# Patient Record
Sex: Male | Born: 1970 | Race: Black or African American | Hispanic: No | Marital: Married | Smoking: Never smoker
Health system: Southern US, Community
[De-identification: ages and names within clinical notes are randomized; demographics above are authoritative.]

## PROBLEM LIST (undated history)

## (undated) DIAGNOSIS — E119 Type 2 diabetes mellitus without complications: Secondary | ICD-10-CM

## (undated) DIAGNOSIS — I509 Heart failure, unspecified: Secondary | ICD-10-CM

## (undated) DIAGNOSIS — I429 Cardiomyopathy, unspecified: Secondary | ICD-10-CM

## (undated) DIAGNOSIS — I82409 Acute embolism and thrombosis of unspecified deep veins of unspecified lower extremity: Secondary | ICD-10-CM

## (undated) DIAGNOSIS — I10 Essential (primary) hypertension: Secondary | ICD-10-CM

## (undated) DIAGNOSIS — E785 Hyperlipidemia, unspecified: Secondary | ICD-10-CM

## (undated) HISTORY — DX: Essential (primary) hypertension: I10

## (undated) HISTORY — DX: Hyperlipidemia, unspecified: E78.5

## (undated) HISTORY — DX: Cardiomyopathy, unspecified: I42.9

## (undated) HISTORY — DX: Type 2 diabetes mellitus without complications: E11.9

## (undated) HISTORY — DX: Acute embolism and thrombosis of unspecified deep veins of unspecified lower extremity: I82.409

## (undated) HISTORY — DX: Heart failure, unspecified: I50.9

---

## 2009-07-27 ENCOUNTER — Emergency Department: Payer: Self-pay | Admitting: Unknown Physician Specialty

## 2015-03-01 ENCOUNTER — Emergency Department
Admission: EM | Admit: 2015-03-01 | Discharge: 2015-03-01 | Disposition: A | Discharge status: Home / Self Care | Payer: Managed Care, Other (non HMO) | Attending: Emergency Medicine | Admitting: Emergency Medicine

## 2015-03-01 ENCOUNTER — Encounter: Payer: Self-pay | Admitting: Emergency Medicine

## 2015-03-01 DIAGNOSIS — R05 Cough: Secondary | ICD-10-CM | POA: Diagnosis present

## 2015-03-01 DIAGNOSIS — J209 Acute bronchitis, unspecified: Secondary | ICD-10-CM | POA: Insufficient documentation

## 2015-03-01 DIAGNOSIS — J40 Bronchitis, not specified as acute or chronic: Secondary | ICD-10-CM

## 2015-03-01 MED ORDER — AZITHROMYCIN 250 MG PO TABS: ORAL_TABLET | ORAL | Status: AC

## 2015-03-01 MED ORDER — PSEUDOEPH-BROMPHEN-DM 30-2-10 MG/5ML PO SYRP: 5.0000 mL | ORAL_SOLUTION | Freq: Four times a day (QID) | ORAL | Status: DC | PRN

## 2015-03-01 NOTE — ED Notes (Signed)
States he developed flu like sxs' last Thursday.  States fever and some of the body aches are better but conts to have non prod cough

## 2015-03-01 NOTE — ED Notes (Signed)
Pt from home with chills, cough, congestion, headache x 3 days. Pt states he is not coughing up any phlegm, vomited once.

## 2015-03-01 NOTE — ED Provider Notes (Signed)
[facility] Emergency Department Provider Note  ____________________________________________  Time seen: Approximately 12:21 PM  I have reviewed the triage vital signs and the nursing notes.   HISTORY  Chief Complaint Chills; Cough; and Headache    HPI Jerry Saunders is a 44 y.o. male patient complaining of flulike symptoms for 5 days. Patient stated intermittent fever and body aches. Patient stated there is a frontal headache nasal congestion and a nonproductive cough. Patient states one episode of vomiting but denies any nausea or diarrhea. No palliative measures for this complaint. Patient rates his pain discomfort as a 5/10.   History reviewed. No pertinent past medical history.  There are no active problems to display for this patient.   History reviewed. No pertinent past surgical history.  Current Outpatient Rx  Name  Route  Sig  Dispense  Refill  . azithromycin (ZITHROMAX Z-PAK) 250 MG tablet      Take 2 tablets (500 mg) on  Day 1,  followed by 1 tablet (250 mg) once daily on Days 2 through 5.   6 each   0   . brompheniramine-pseudoephedrine-DM 30-2-10 MG/5ML syrup   Oral   Take 5 mLs by mouth 4 (four) times daily as needed.   120 mL   0     Allergies Review of patient's allergies indicates no known allergies.  History reviewed. No pertinent family history.  Social History Social History  Substance Use Topics  . Smoking status: Never Smoker   . Smokeless tobacco: None  . Alcohol Use: No    Review of Systems Constitutional: Fever, chills and body aches. Eyes: No visual changes. ENT: No sore throat. Cardiovascular: Denies chest pain. Respiratory: Denies shortness of breath. Nonproductive cough Gastrointestinal: No abdominal pain.  No nausea, no vomiting.  No diarrhea.  No constipation. Genitourinary: Negative for dysuria. Musculoskeletal: Negative for back pain. Skin: Negative for rash. Neurological: Negative for headaches,  focal weakness or numbness. 10-point ROS otherwise negative.  ____________________________________________   PHYSICAL EXAM:  VITAL SIGNS: ED Triage Vitals  Enc Vitals Group     BP 03/01/15 1135 108/61 mmHg     Pulse Rate 03/01/15 1135 91     Resp --      Temp 03/01/15 1135 98.2 F (36.8 C)     Temp Source 03/01/15 1135 Oral     SpO2 03/01/15 1135 97 %     Weight 03/01/15 1135 268 lb (121.564 kg)     Height 03/01/15 1135 6\' 2"  (1.88 m)     Head Cir --      Peak Flow --      Pain Score 03/01/15 1136 5     Pain Loc --      Pain Edu? --      Excl. in GC? --     Constitutional: Alert and oriented. Well appearing and in no acute distress. Eyes: Conjunctivae are normal. PERRL. EOMI. Head: Atraumatic. Nose: No congestion/rhinnorhea. Mouth/Throat: Mucous membranes are moist.  Oropharynx non-erythematous. Neck: No stridor.  No cervical spine tenderness to palpation. Hematological/Lymphatic/Immunilogical: No cervical lymphadenopathy. Cardiovascular: Normal rate, regular rhythm. Grossly normal heart sounds.  Good peripheral circulation. Respiratory: Normal respiratory effort.  No retractions. Lungs bilateral Rales and increased cough with inspiration. Gastrointestinal: Soft and nontender. No distention. No abdominal bruits. No CVA tenderness. Musculoskeletal: No lower extremity tenderness nor edema.  No joint effusions. Neurologic:  Normal speech and language. No gross focal neurologic deficits are appreciated. No gait instability. Skin:  Skin is warm, dry and  intact. No rash noted. Psychiatric: Mood and affect are normal. Speech and behavior are normal.  ____________________________________________   LABS (all labs ordered are listed, but only abnormal results are displayed)  Labs Reviewed - No data to  display ____________________________________________  EKG   ____________________________________________  RADIOLOGY   ____________________________________________   PROCEDURES  Procedure(s) performed: None  Critical Care performed: No  ____________________________________________   INITIAL IMPRESSION / ASSESSMENT AND PLAN / ED COURSE  Pertinent labs & imaging results that were available during my care of the patient were reviewed by me and considered in my medical decision making (see chart for details).  Bronchitis. Skin a prescription for Bromfed-DM and Zithromax. Patient given a work note for today. Patient advised to [facility] condition [facility]. ____________________________________________   FINAL CLINICAL IMPRESSION(S) / ED DIAGNOSES  Final diagnoses:  Bronchitis      Joni Reining, PA-C 03/01/15 1229  Richardean Canal, MD 03/01/15 [phone]

## 2021-06-18 ENCOUNTER — Emergency Department (HOSPITAL_COMMUNITY): Payer: Medicaid Other

## 2021-06-18 ENCOUNTER — Encounter (HOSPITAL_COMMUNITY): Payer: Self-pay

## 2021-06-18 ENCOUNTER — Other Ambulatory Visit: Payer: Self-pay

## 2021-06-18 ENCOUNTER — Inpatient Hospital Stay (HOSPITAL_COMMUNITY)
Admission: EM | Admit: 2021-06-18 | Discharge: 2021-06-23 | DRG: 286 | Disposition: A | Discharge status: Home / Self Care | Payer: Medicaid Other | Attending: Internal Medicine | Admitting: Internal Medicine

## 2021-06-18 DIAGNOSIS — R0602 Shortness of breath: Secondary | ICD-10-CM | POA: Diagnosis present

## 2021-06-18 DIAGNOSIS — I82562 Chronic embolism and thrombosis of left calf muscular vein: Secondary | ICD-10-CM | POA: Diagnosis present

## 2021-06-18 DIAGNOSIS — R7989 Other specified abnormal findings of blood chemistry: Secondary | ICD-10-CM

## 2021-06-18 DIAGNOSIS — I5082 Biventricular heart failure: Secondary | ICD-10-CM | POA: Diagnosis present

## 2021-06-18 DIAGNOSIS — I248 Other forms of acute ischemic heart disease: Secondary | ICD-10-CM | POA: Diagnosis present

## 2021-06-18 DIAGNOSIS — I272 Pulmonary hypertension, unspecified: Secondary | ICD-10-CM | POA: Diagnosis present

## 2021-06-18 DIAGNOSIS — Z7984 Long term (current) use of oral hypoglycemic drugs: Secondary | ICD-10-CM | POA: Diagnosis not present

## 2021-06-18 DIAGNOSIS — R011 Cardiac murmur, unspecified: Secondary | ICD-10-CM | POA: Diagnosis present

## 2021-06-18 DIAGNOSIS — I420 Dilated cardiomyopathy: Secondary | ICD-10-CM | POA: Diagnosis present

## 2021-06-18 DIAGNOSIS — I5043 Acute on chronic combined systolic (congestive) and diastolic (congestive) heart failure: Principal | ICD-10-CM

## 2021-06-18 DIAGNOSIS — E669 Obesity, unspecified: Secondary | ICD-10-CM | POA: Diagnosis present

## 2021-06-18 DIAGNOSIS — E119 Type 2 diabetes mellitus without complications: Secondary | ICD-10-CM

## 2021-06-18 DIAGNOSIS — K761 Chronic passive congestion of liver: Secondary | ICD-10-CM | POA: Diagnosis present

## 2021-06-18 DIAGNOSIS — R0600 Dyspnea, unspecified: Principal | ICD-10-CM

## 2021-06-18 DIAGNOSIS — J9601 Acute respiratory failure with hypoxia: Secondary | ICD-10-CM | POA: Diagnosis present

## 2021-06-18 DIAGNOSIS — I493 Ventricular premature depolarization: Secondary | ICD-10-CM | POA: Diagnosis present

## 2021-06-18 DIAGNOSIS — Z6837 Body mass index (BMI) 37.0-37.9, adult: Secondary | ICD-10-CM

## 2021-06-18 DIAGNOSIS — I5041 Acute combined systolic (congestive) and diastolic (congestive) heart failure: Secondary | ICD-10-CM

## 2021-06-18 DIAGNOSIS — I82409 Acute embolism and thrombosis of unspecified deep veins of unspecified lower extremity: Secondary | ICD-10-CM

## 2021-06-18 DIAGNOSIS — D649 Anemia, unspecified: Secondary | ICD-10-CM | POA: Diagnosis present

## 2021-06-18 DIAGNOSIS — I509 Heart failure, unspecified: Secondary | ICD-10-CM

## 2021-06-18 LAB — CBC WITH DIFFERENTIAL/PLATELET
Abs Immature Granulocytes: 0.01 10*3/uL (ref 0.00–0.07)
Basophils Absolute: 0 10*3/uL (ref 0.0–0.1)
Basophils Relative: 0 %
Eosinophils Absolute: 0.1 10*3/uL (ref 0.0–0.5)
Eosinophils Relative: 1 %
HCT: 40.5 % (ref 39.0–52.0)
Hemoglobin: 12.6 g/dL — ABNORMAL LOW (ref 13.0–17.0)
Immature Granulocytes: 0 %
Lymphocytes Relative: 33 %
Lymphs Abs: 2.6 10*3/uL (ref 0.7–4.0)
MCH: 27.8 pg (ref 26.0–34.0)
MCHC: 31.1 g/dL (ref 30.0–36.0)
MCV: 89.2 fL (ref 80.0–100.0)
Monocytes Absolute: 0.6 10*3/uL (ref 0.1–1.0)
Monocytes Relative: 8 %
Neutro Abs: 4.5 10*3/uL (ref 1.7–7.7)
Neutrophils Relative %: 58 %
Platelets: 368 10*3/uL (ref 150–400)
RBC: 4.54 MIL/uL (ref 4.22–5.81)
RDW: 14.1 % (ref 11.5–15.5)
WBC: 7.8 10*3/uL (ref 4.0–10.5)
nRBC: 0 % (ref 0.0–0.2)

## 2021-06-18 LAB — COMPREHENSIVE METABOLIC PANEL
ALT: 53 U/L — ABNORMAL HIGH (ref 0–44)
AST: 24 U/L (ref 15–41)
Albumin: 3.5 g/dL (ref 3.5–5.0)
Alkaline Phosphatase: 81 U/L (ref 38–126)
Anion gap: 8 (ref 5–15)
BUN: 16 mg/dL (ref 6–20)
CO2: 27 mmol/L (ref 22–32)
Calcium: 8.7 mg/dL — ABNORMAL LOW (ref 8.9–10.3)
Chloride: 103 mmol/L (ref 98–111)
Creatinine, Ser: 0.95 mg/dL (ref 0.61–1.24)
GFR, Estimated: >60 mL/min (ref >60)
Glucose, Bld: 142 mg/dL — ABNORMAL HIGH (ref 70–99)
Potassium: 4.1 mmol/L (ref 3.5–5.1)
Sodium: 138 mmol/L (ref 135–145)
Total Bilirubin: 1.2 mg/dL (ref 0.3–1.2)
Total Protein: 7 g/dL (ref 6.5–8.1)

## 2021-06-18 LAB — BRAIN NATRIURETIC PEPTIDE: B Natriuretic Peptide: 1295.7 pg/mL — ABNORMAL HIGH (ref 0.0–100.0)

## 2021-06-18 LAB — CBG MONITORING, ED: Glucose-Capillary: 140 mg/dL — ABNORMAL HIGH (ref 70–99)

## 2021-06-18 LAB — TROPONIN I (HIGH SENSITIVITY)
Troponin I (High Sensitivity): 45 ng/L — ABNORMAL HIGH (ref <18)
Troponin I (High Sensitivity): 49 ng/L — ABNORMAL HIGH (ref <18)

## 2021-06-18 LAB — D-DIMER, QUANTITATIVE: D-Dimer, Quant: 2.53 ug/mL-FEU — ABNORMAL HIGH (ref 0.00–0.50)

## 2021-06-18 MED ORDER — FUROSEMIDE 10 MG/ML IJ SOLN
40.0000 mg | Freq: Once | INTRAMUSCULAR | Status: AC
  Administered 2021-06-18: 40 mg via INTRAVENOUS
  Filled 2021-06-18: qty 4

## 2021-06-18 MED ORDER — INSULIN ASPART 100 UNIT/ML IJ SOLN
0.0000 [IU] | Freq: Three times a day (TID) | INTRAMUSCULAR | Status: DC
  Administered 2021-06-19 (×2): 2 [IU] via SUBCUTANEOUS
  Administered 2021-06-19 – 2021-06-20 (×3): 1 [IU] via SUBCUTANEOUS
  Administered 2021-06-21: 2 [IU] via SUBCUTANEOUS
  Administered 2021-06-21: 1 [IU] via SUBCUTANEOUS
  Administered 2021-06-22: 2 [IU] via SUBCUTANEOUS
  Administered 2021-06-22: 5 [IU] via SUBCUTANEOUS
  Administered 2021-06-23: 2 [IU] via SUBCUTANEOUS

## 2021-06-18 MED ORDER — IOHEXOL 350 MG/ML SOLN
100.0000 mL | Freq: Once | INTRAVENOUS | Status: AC | PRN
  Administered 2021-06-18: 100 mL via INTRAVENOUS

## 2021-06-18 MED ORDER — INSULIN ASPART 100 UNIT/ML IJ SOLN: 0.0000 [IU] | Freq: Every day | INTRAMUSCULAR | Status: DC

## 2021-06-18 MED ORDER — ENOXAPARIN SODIUM 40 MG/0.4ML IJ SOSY
40.0000 mg | PREFILLED_SYRINGE | INTRAMUSCULAR | Status: DC
  Administered 2021-06-19 (×2): 40 mg via SUBCUTANEOUS
  Filled 2021-06-18 (×2): qty 0.4

## 2021-06-18 MED ORDER — ALBUTEROL SULFATE HFA 108 (90 BASE) MCG/ACT IN AERS
2.0000 | INHALATION_SPRAY | RESPIRATORY_TRACT | Status: DC | PRN
  Administered 2021-06-18: 2 via RESPIRATORY_TRACT
  Filled 2021-06-18: qty 6.7

## 2021-06-18 MED ORDER — FUROSEMIDE 10 MG/ML IJ SOLN
40.0000 mg | Freq: Two times a day (BID) | INTRAMUSCULAR | Status: DC
  Administered 2021-06-19: 40 mg via INTRAVENOUS
  Filled 2021-06-18: qty 4

## 2021-06-18 NOTE — ED Provider Triage Note (Signed)
Emergency Medicine Provider Triage Evaluation Note  Jerry Saunders , a 51 y.o. male  was evaluated in triage.  Pt complains of shortness of breath has been ongoing for the past week.  He does report a productive cough with some clear sputum.  Feels like is very for him to catch his breath.  He reports is exacerbated when he lays down flat bed at night.  States is harder for him to take a deep breath.  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  He does not have any prior history of blood clots.  He also endorses some left calf pain long with noticing more swelling to his left leg.  Review of Systems  Positive: Shortness of breath, leg swelling Negative: Pain, fever, patient's  Physical Exam  BP (!) 125/97 (BP Location: Right Arm)   Pulse (!) 119   Temp 98.9 F (37.2 C) (Oral)   Resp 19   Ht 6\' 2"  (1.88 m)   Wt 122.5 kg   SpO2 95%   BMI 34.67 kg/m  Gen:   Awake, no distress   Resp:  Normal effort  MSK:   Moves extremities without difficulty  Other:  's are clear to auscultation although somewhat diminished at the bases.  Medical Decision Making  Medically screening exam initiated at 5:38 PM.  Appropriate orders placed.  Jerry Saunders was informed that the remainder of the evaluation will be completed by another provider, this initial triage assessment does not replace that evaluation, and the importance of remaining in the ED until their evaluation is complete.  Patient tachycardic in triage, oxygen saturation 95%.  Speaking in short sentences but without obvious wheezing noted.  No prior history of tobacco use.  No prior history of blood clots, concern for pulmonary embolism.  Dimer has been added.   Jerry Birmingham, PA-C 06/18/21 1743

## 2021-06-18 NOTE — H&P (Signed)
History and Physical    Jerry Saunders [id number] DOB: [birth year] DOA: 06/18/2021  PCP: Pcp, No  Patient coming from: Home  Chief Complaint: Shortness of breath  HPI: Jerry Saunders is a 51 y.o. male with medical history significant of type 2 diabetes not on meds, history of syncope, abnormal EKG, PVCs, and cardiac murmur last evaluated by cardiology at [facility] in 2018.  He presents to the ED complaining of shortness of breath, cough, orthopnea, and bilateral lower extremity edema.  Slightly tachycardic and tachypneic.  SPO2 90% on room air, placed on 2 L supplemental oxygen.  Labs showing WBC 7.8, hemoglobin 12.6, MCV 89.2, platelet count 368k.  Sodium 138, potassium 4.1, chloride 103, bicarb 27, BUN 16, creatinine 0.9, glucose 142.  ALT 53, remainder of LFTs normal.  High-sensitivity troponin 45 >49.  D-dimer 2.53.  BNP 1295.  CTA chest negative for PE.  Showing cardiomegaly, vascular congestion/ possible early edema, and small bilateral effusions.  Scattered borderline sized mediastinal lymph nodes, likely reactive. Patient was given IV Lasix 40 mg and albuterol inhaler treatment.  Patient reports 1.5-week history of dyspnea both with exertion and at rest, cough, orthopnea, and bilateral lower extremity edema.  Denies fevers or chest pain.  He denies history of any medical problems and does not have a primary care physician.  States he has not seen a doctor in several years.  Reports eating fast food at work and drinking a lot of sodas  Review of Systems:  Review of Systems  All other systems reviewed and are negative.   History reviewed. No pertinent past medical history.  History reviewed. No pertinent surgical history.   reports that he has never smoked. He does not have any smokeless tobacco history on file. He reports that he does not drink alcohol and does not use drugs.  No Known Allergies  History reviewed. No pertinent family history.  Prior to Admission medications    Medication Sig Start Date End Date Taking? Authorizing Provider  Artificial Tear Ointment (DRY EYES OP) Place 2 drops into both eyes 2 (two) times daily as needed (dry eyes).   Yes [provider]  ibuprofen (ADVIL) 200 MG tablet Take 600 mg by mouth every 6 (six) hours as needed for headache or moderate pain.   Yes [provider]    Physical Exam: Vitals:   06/18/21 2000 06/18/21 2110 06/18/21 2130 06/18/21 2245  BP: (!) 130/95 (!) 122/93 119/71   Pulse: (!) 113 (!) 120 87 (!) 111  Resp: (!) 36 (!) 33 (!) 30 (!) 27  Temp:      TempSrc:      SpO2: 92% 95% 97% 93%  Weight:      Height:        Physical Exam Vitals reviewed.  Constitutional:      General: He is not in acute distress. HENT:     Head: Normocephalic and atraumatic.  Eyes:     Extraocular Movements: Extraocular movements intact.  Neck:     Comments: +JVD Cardiovascular:     Rate and Rhythm: Regular rhythm. Tachycardia present.     Pulses: Normal pulses.     Comments: Slightly tachycardic Pulmonary:     Effort: No respiratory distress.     Breath sounds: Rales present. No wheezing.     Comments: Slightly tachypneic Abdominal:     General: Bowel sounds are normal.     Palpations: Abdomen is soft.     Tenderness: There is no abdominal tenderness. There is no  guarding.  Musculoskeletal:     Cervical back: Normal range of motion.     Right lower leg: Edema present.     Left lower leg: Edema present.     Comments: 2+ pitting edema of bilateral lower extremities  Skin:    General: Skin is warm and dry.  Neurological:     General: No focal deficit present.     Mental Status: He is alert and oriented to person, place, and time.      Labs on Admission: I have personally reviewed following labs and imaging studies  CBC: Recent Labs  Lab 06/18/21 1735  WBC 7.8  NEUTROABS 4.5  HGB 12.6*  HCT 40.5  MCV 89.2  PLT 123XX123   Basic Metabolic Panel: Recent Labs  Lab 06/18/21 1735  NA 138   K 4.1  CL 103  CO2 27  GLUCOSE 142*  BUN 16  CREATININE 0.95  CALCIUM 8.7*   GFR: Estimated Creatinine Clearance: 130.8 mL/min (by C-G formula based on SCr of 0.95 mg/dL). Liver Function Tests: Recent Labs  Lab 06/18/21 1735  AST 24  ALT 53*  ALKPHOS 81  BILITOT 1.2  PROT 7.0  ALBUMIN 3.5   No results for input(s): "LIPASE", "AMYLASE" in the last 168 hours. No results for input(s): "AMMONIA" in the last 168 hours. Coagulation Profile: No results for input(s): "INR", "PROTIME" in the last 168 hours. Cardiac Enzymes: No results for input(s): "CKTOTAL", "CKMB", "CKMBINDEX", "TROPONINI" in the last 168 hours. BNP (last 3 results) No results for input(s): "PROBNP" in the last 8760 hours. HbA1C: No results for input(s): "HGBA1C" in the last 72 hours. CBG: No results for input(s): "GLUCAP" in the last 168 hours. Lipid Profile: No results for input(s): "CHOL", "HDL", "LDLCALC", "TRIG", "CHOLHDL", "LDLDIRECT" in the last 72 hours. Thyroid Function Tests: No results for input(s): "TSH", "T4TOTAL", "FREET4", "T3FREE", "THYROIDAB" in the last 72 hours. Anemia Panel: No results for input(s): "VITAMINB12", "FOLATE", "FERRITIN", "TIBC", "IRON", "RETICCTPCT" in the last 72 hours. Urine analysis: No results found for: "COLORURINE", "APPEARANCEUR", "LABSPEC", "PHURINE", "GLUCOSEU", "HGBUR", "BILIRUBINUR", "KETONESUR", "PROTEINUR", "UROBILINOGEN", "NITRITE", "LEUKOCYTESUR"  Radiological Exams on Admission: I have personally reviewed images CT Angio Chest PE W and/or Wo Contrast  Result Date: 06/18/2021 CLINICAL DATA:  Shortness of breath, productive cough. Elevated D-dimer. EXAM: CT ANGIOGRAPHY CHEST WITH CONTRAST TECHNIQUE: Multidetector CT imaging of the chest was performed using the standard protocol during bolus administration of intravenous contrast. Multiplanar CT image reconstructions and MIPs were obtained to evaluate the vascular anatomy. RADIATION DOSE REDUCTION: This exam was  performed according to the departmental dose-optimization program which includes automated exposure control, adjustment of the mA and/or kV according to patient size and/or use of iterative reconstruction technique. CONTRAST:  131mL OMNIPAQUE IOHEXOL 350 MG/ML SOLN COMPARISON:  None Available. FINDINGS: Cardiovascular: No filling defects in the pulmonary arteries to suggest pulmonary emboli. Cardiomegaly. Aorta normal caliber. Mediastinum/Nodes: Borderline mediastinal lymph nodes throughout the mediastinum. No axillary or hilar adenopathy. Trachea and esophagus are unremarkable. Thyroid unremarkable. Lungs/Pleura: Small bilateral pleural effusions. Mild ground-glass opacities could reflect mild edema. Upper Abdomen: Scattered low-density lesions in the liver, likely cysts. No acute abnormality. Musculoskeletal: Chest wall soft tissues are unremarkable. No acute bony abnormality. Review of the MIP images confirms the above findings. IMPRESSION: No evidence of pulmonary embolus. Cardiomegaly, vascular congestion.  Possible early edema. Small bilateral effusions. Scattered borderline sized mediastinal lymph nodes, likely reactive. Electronically Signed   By: Rolm Baptise M.D.   On: 06/18/2021 21:01   DG  Chest 2 View  Result Date: 06/18/2021 CLINICAL DATA:  Shortness of breath EXAM: CHEST - 2 VIEW COMPARISON:  None Available. FINDINGS: Gross cardiomegaly. Diffuse bilateral interstitial pulmonary opacity. Osseous structures unremarkable. IMPRESSION: Gross cardiomegaly with diffuse bilateral interstitial pulmonary opacity, likely edema. No focal airspace opacity. Electronically Signed   By: Delanna Ahmadi M.D.   On: 06/18/2021 18:17    EKG: Independently reviewed.  Sinus tachycardia, incomplete RBBB.  No prior tracing for comparison.  Assessment and Plan  Suspected new onset CHF Patient presenting with dyspnea, cough, orthopnea, and bilateral lower extremity edema.  He has not seen a physician in several years.   BNP significantly elevated at 1295.  CT showing cardiomegaly, vascular congestion/possible early edema, small bilateral effusions.  He was last seen by cardiology at [facility] in 2018 for syncope and cardiac murmur but I am not able to see echo results under care everywhere. -Patient was given IV Lasix 40 mg in the ED.  Continue diuresis with IV Lasix 40 mg twice daily starting in the morning. -Echocardiogram -Monitor renal function -Monitor intake and output -Daily weights -Low-sodium diet with fluid restriction  Acute hypoxic respiratory failure Likely due to new onset CHF.  SPO2 90% on room air, currently requiring 2 L supplemental oxygen to maintain sats in the mid 90s. -Continue supplemental oxygen, wean as tolerated  Elevated D-dimer CTA chest negative for PE. -Bilateral lower extremity Dopplers ordered to rule out DVT.  Type 2 diabetes not on medications -Check A1c as it is unknown -Sensitive sliding scale insulin ACHS -Carb modified diet  Mild normocytic anemia Baseline hemoglobin unknown.  Patient is not endorsing any symptoms consistent with active bleed/GI bleed. -Continue to monitor  Mild elevation of transaminase Likely due to hepatic congestion in the setting of new onset heart failure. -Continue to monitor  Mild troponin elevation Likely due to demand ischemia.  Troponin mildly elevated but stable and not consistent with ACS.  Not endorsing chest pain.  DVT prophylaxis: Lovenox Code Status: Full Code Family Communication: No family available at this time. Level of care: Telemetry bed Admission status: It is my clinical opinion that admission to INPATIENT is reasonable and necessary because of the expectation that this patient will require hospital care that crosses at least 2 midnights to treat this condition based on the medical complexity of the problems presented.  Given the aforementioned information, the predictability of an adverse outcome is felt to be significant.    Shela Leff MD [facility]  If 7PM-7AM, please contact night-coverage [email or web address]  06/18/2021, 10:52 PM

## 2021-06-18 NOTE — ED Notes (Signed)
Patient transported to CT 

## 2021-06-18 NOTE — ED Provider Notes (Signed)
[facility] EMERGENCY DEPARTMENT Provider Note   CSN: [id number] Arrival date & time: 06/18/21  1710     History  Chief Complaint  Patient presents with   Shortness of Breath    Jerry Saunders is a 51 y.o. male.  51 year old male with prior medical history as detailed below presents for evaluation.  Patient complains of gradually worsening shortness of breath over the last week.  Patient reports productive cough.  Patient reports increased shortness of breath especially with exertion.  Laying down flat also exacerbates his symptoms.  Patient denies known history of cardiac disease.  Patient does not have established PCP.  Patient has noted increased edema to both lower extremities.  The history is provided by the patient and medical records.  Shortness of Breath Severity:  Moderate Onset quality:  Gradual Duration:  1 week Timing:  Constant Progression:  Waxing and waning Chronicity:  New Context: activity        Home Medications Prior to Admission medications   Medication Sig Start Date End Date Taking? Authorizing Provider  brompheniramine-pseudoephedrine-DM 30-2-10 MG/5ML syrup Take 5 mLs by mouth 4 (four) times daily as needed. 03/01/15   Joni Reining, PA-C      Allergies    Patient has no known allergies.    Review of Systems   Review of Systems  Respiratory:  Positive for shortness of breath.   All other systems reviewed and are negative.   Physical Exam Updated Vital Signs BP (!) 122/93   Pulse (!) 120   Temp 98.9 F (37.2 C) (Oral)   Resp (!) 33   Ht 6\' 2"  (1.88 m)   Wt 122.5 kg   SpO2 95%   BMI 34.67 kg/m  Physical Exam Vitals and nursing note reviewed.  Constitutional:      General: He is not in acute distress.    Appearance: Normal appearance. He is well-developed.  HENT:     Head: Normocephalic and atraumatic.  Eyes:     Conjunctiva/sclera: Conjunctivae normal.     Pupils: Pupils are equal, round, and reactive to  light.  Cardiovascular:     Rate and Rhythm: Normal rate and regular rhythm.     Heart sounds: Normal heart sounds.  Pulmonary:     Effort: Pulmonary effort is normal. No respiratory distress.     Breath sounds: Examination of the right-lower field reveals decreased breath sounds. Examination of the left-lower field reveals decreased breath sounds. Decreased breath sounds present.  Abdominal:     General: There is no distension.     Palpations: Abdomen is soft.     Tenderness: There is no abdominal tenderness.  Musculoskeletal:        General: No deformity. Normal range of motion.     Cervical back: Normal range of motion and neck supple.     Right lower leg: Edema present.     Left lower leg: Edema present.  Skin:    General: Skin is warm and dry.  Neurological:     General: No focal deficit present.     Mental Status: He is alert and oriented to person, place, and time.     ED Results / Procedures / Treatments   Labs (all labs ordered are listed, but only abnormal results are displayed) Labs Reviewed  COMPREHENSIVE METABOLIC PANEL - Abnormal; Notable for the following components:      Result Value   Glucose, Bld 142 (*)    Calcium 8.7 (*)    ALT  53 (*)    All other components within normal limits  CBC WITH DIFFERENTIAL/PLATELET - Abnormal; Notable for the following components:   Hemoglobin 12.6 (*)    All other components within normal limits  BRAIN NATRIURETIC PEPTIDE - Abnormal; Notable for the following components:   B Natriuretic Peptide 1,295.7 (*)    All other components within normal limits  D-DIMER, QUANTITATIVE - Abnormal; Notable for the following components:   D-Dimer, Quant 2.53 (*)    All other components within normal limits  TROPONIN I (HIGH SENSITIVITY) - Abnormal; Notable for the following components:   Troponin I (High Sensitivity) 45 (*)    All other components within normal limits  TROPONIN I (HIGH SENSITIVITY) - Abnormal; Notable for the following  components:   Troponin I (High Sensitivity) 49 (*)    All other components within normal limits    EKG EKG Interpretation  Date/Time:  Sunday June 18 2021 17:29:24 EDT Ventricular Rate:  118 PR Interval:  162 QRS Duration: 102 QT Interval:  336 QTC Calculation: 470 R Axis:   139 Text Interpretation: Sinus tachycardia Biatrial enlargement Right axis deviation Pulmonary disease pattern Incomplete right bundle branch block Right ventricular hypertrophy Abnormal ECG No previous ECGs available Confirmed by Kristine Royal [id number]) on 06/18/2021 8:05:40 PM  Radiology CT Angio Chest PE W and/or Wo Contrast  Result Date: 06/18/2021 CLINICAL DATA:  Shortness of breath, productive cough. Elevated D-dimer. EXAM: CT ANGIOGRAPHY CHEST WITH CONTRAST TECHNIQUE: Multidetector CT imaging of the chest was performed using the standard protocol during bolus administration of intravenous contrast. Multiplanar CT image reconstructions and MIPs were obtained to evaluate the vascular anatomy. RADIATION DOSE REDUCTION: This exam was performed according to the departmental dose-optimization program which includes automated exposure control, adjustment of the mA and/or kV according to patient size and/or use of iterative reconstruction technique. CONTRAST:  OMNIPAQUE IOHEXOL 350 MG/ML SOLN COMPARISON:  None Available. FINDINGS: Cardiovascular: No filling defects in the pulmonary arteries to suggest pulmonary emboli. Cardiomegaly. Aorta normal caliber. Mediastinum/Nodes: Borderline mediastinal lymph nodes throughout the mediastinum. No axillary or hilar adenopathy. Trachea and esophagus are unremarkable. Thyroid unremarkable. Lungs/Pleura: Small bilateral pleural effusions. Mild ground-glass opacities could reflect mild edema. Upper Abdomen: Scattered low-density lesions in the liver, likely cysts. No acute abnormality. Musculoskeletal: Chest wall soft tissues are unremarkable. No acute bony abnormality. Review of the  MIP images confirms the above findings. IMPRESSION: No evidence of pulmonary embolus. Cardiomegaly, vascular congestion.  Possible early edema. Small bilateral effusions. Scattered borderline sized mediastinal lymph nodes, likely reactive. Electronically Signed   By: Charlett Nose M.D.   On: 06/18/2021 21:01   DG Chest 2 View  Result Date: 06/18/2021 CLINICAL DATA:  Shortness of breath EXAM: CHEST - 2 VIEW COMPARISON:  None Available. FINDINGS: Gross cardiomegaly. Diffuse bilateral interstitial pulmonary opacity. Osseous structures unremarkable. IMPRESSION: Gross cardiomegaly with diffuse bilateral interstitial pulmonary opacity, likely edema. No focal airspace opacity. Electronically Signed   By: Jearld Lesch M.D.   On: 06/18/2021 18:17    Procedures Procedures    Medications Ordered in ED Medications  albuterol (VENTOLIN HFA) 108 (90 Base) MCG/ACT inhaler 2 puff (2 puffs Inhalation Given 06/18/21 2103)  iohexol (OMNIPAQUE) 350 MG/ML injection 100 mL (100 mLs Intravenous Contrast Given 06/18/21 2054)  furosemide (LASIX) injection 40 mg (40 mg Intravenous Given 06/18/21 2103)    ED Course/ Medical Decision Making/ A&P  Medical Decision Making Amount and/or Complexity of Data Reviewed Labs: ordered. Radiology: ordered.  Risk Prescription drug management.    Medical Screen Complete  This patient presented to the ED with complaint of shortness of breath.  This complaint involves an extensive number of treatment options. The initial differential diagnosis includes, but is not limited to, CHF exacerbation, pneumonia, PE, etc.  This presentation is: Acute, Self-Limited, Previously Undiagnosed, Uncertain Prognosis, Complicated, Systemic Symptoms, and Threat to Life/Bodily Function  Patient without established PCP and with no reported prior medical history presents with complaint of gradually worsening shortness of breath.  Patient's presentation and exam are  most consistent with likely CHF exacerbation.  This appears to be new in onset.  Patient with mild hypoxia on room air with a saturation of 90%. Sats improved with administration of nasal cannula O2 at 2 L.  Lasix administered here in the ED.  Patient will require admission for further work-up and treatment.  Additional history obtained:  External records from outside sources obtained and reviewed including prior ED visits and prior Inpatient records.    Lab Tests:  I ordered and personally interpreted labs.  The pertinent results include: CBC CMP BNP D-dimer troponin   Imaging Studies ordered:  I ordered imaging studies including chest x-ray I independently visualized and interpreted obtained imaging which showed CHF exacerbation I agree with the radiologist interpretation.   Cardiac Monitoring:  The patient was maintained on a cardiac monitor.  I personally viewed and interpreted the cardiac monitor which showed an underlying rhythm of: Sinus tach   Medicines ordered:  I ordered medication including Lasix for CHF exacerbation Reevaluation of the patient after these medicines showed that the patient: improved    Problem List / ED Course:  CHF exacerbation   Reevaluation:  After the interventions noted above, I reevaluated the patient and found that they have: improved  Disposition:  After consideration of the diagnostic results and the patients response to treatment, I feel that the patent would benefit from admission.          Final Clinical Impression(s) / ED Diagnoses Final diagnoses:  Dyspnea, unspecified type    Rx / DC Orders ED Discharge Orders     None         Wynetta Fines, MD 06/18/21 2232

## 2021-06-18 NOTE — ED Triage Notes (Signed)
Patient reports has had a cough for 3 weeks and then he started getting sob last week. Denies chest pain but is tachy in triage.

## 2021-06-19 ENCOUNTER — Inpatient Hospital Stay (HOSPITAL_COMMUNITY): Payer: Medicaid Other

## 2021-06-19 DIAGNOSIS — R609 Edema, unspecified: Secondary | ICD-10-CM

## 2021-06-19 DIAGNOSIS — R0602 Shortness of breath: Secondary | ICD-10-CM

## 2021-06-19 DIAGNOSIS — I5041 Acute combined systolic (congestive) and diastolic (congestive) heart failure: Secondary | ICD-10-CM

## 2021-06-19 DIAGNOSIS — R7989 Other specified abnormal findings of blood chemistry: Secondary | ICD-10-CM

## 2021-06-19 DIAGNOSIS — R0600 Dyspnea, unspecified: Secondary | ICD-10-CM

## 2021-06-19 LAB — CBC
HCT: 39.4 % (ref 39.0–52.0)
Hemoglobin: 12.3 g/dL — ABNORMAL LOW (ref 13.0–17.0)
MCH: 27.7 pg (ref 26.0–34.0)
MCHC: 31.2 g/dL (ref 30.0–36.0)
MCV: 88.7 fL (ref 80.0–100.0)
Platelets: 341 10*3/uL (ref 150–400)
RBC: 4.44 MIL/uL (ref 4.22–5.81)
RDW: 14.2 % (ref 11.5–15.5)
WBC: 9.3 10*3/uL (ref 4.0–10.5)
nRBC: 0 % (ref 0.0–0.2)

## 2021-06-19 LAB — COMPREHENSIVE METABOLIC PANEL
ALT: 48 U/L — ABNORMAL HIGH (ref 0–44)
AST: 21 U/L (ref 15–41)
Albumin: 3.3 g/dL — ABNORMAL LOW (ref 3.5–5.0)
Alkaline Phosphatase: 78 U/L (ref 38–126)
Anion gap: 8 (ref 5–15)
BUN: 14 mg/dL (ref 6–20)
CO2: 28 mmol/L (ref 22–32)
Calcium: 8.4 mg/dL — ABNORMAL LOW (ref 8.9–10.3)
Chloride: 103 mmol/L (ref 98–111)
Creatinine, Ser: 0.93 mg/dL (ref 0.61–1.24)
GFR, Estimated: >60 mL/min (ref >60)
Glucose, Bld: 192 mg/dL — ABNORMAL HIGH (ref 70–99)
Potassium: 4.1 mmol/L (ref 3.5–5.1)
Sodium: 139 mmol/L (ref 135–145)
Total Bilirubin: 1 mg/dL (ref 0.3–1.2)
Total Protein: 6.5 g/dL (ref 6.5–8.1)

## 2021-06-19 LAB — ECHOCARDIOGRAM COMPLETE
AR max vel: 3.49 cm2
AV Area VTI: 2.57 cm2
AV Area mean vel: 3.06 cm2
AV Mean grad: 4 mmHg
AV Peak grad: 5.4 mmHg
Ao pk vel: 1.16 m/s
Area-P 1/2: 7.9 cm2
Height: 74 in
MV M vel: 3.82 m/s
MV Peak grad: 58.4 mmHg
S' Lateral: 6.2 cm
Weight: 4320 oz

## 2021-06-19 LAB — HEMOGLOBIN A1C
Hgb A1c MFr Bld: 7.8 % — ABNORMAL HIGH (ref 4.8–5.6)
Mean Plasma Glucose: 177.16 mg/dL

## 2021-06-19 LAB — GLUCOSE, CAPILLARY: Glucose-Capillary: 137 mg/dL — ABNORMAL HIGH (ref 70–99)

## 2021-06-19 LAB — HIV ANTIBODY (ROUTINE TESTING W REFLEX): HIV Screen 4th Generation wRfx: NONREACTIVE

## 2021-06-19 LAB — CBG MONITORING, ED
Glucose-Capillary: 144 mg/dL — ABNORMAL HIGH (ref 70–99)
Glucose-Capillary: 151 mg/dL — ABNORMAL HIGH (ref 70–99)
Glucose-Capillary: 152 mg/dL — ABNORMAL HIGH (ref 70–99)

## 2021-06-19 MED ORDER — PERFLUTREN LIPID MICROSPHERE
1.0000 mL | INTRAVENOUS | Status: AC | PRN
  Administered 2021-06-19: 3 mL via INTRAVENOUS

## 2021-06-19 MED ORDER — METOPROLOL TARTRATE 5 MG/5ML IV SOLN
5.0000 mg | Freq: Once | INTRAVENOUS | Status: DC
  Filled 2021-06-19: qty 5

## 2021-06-19 MED ORDER — ACETAMINOPHEN 325 MG PO TABS
650.0000 mg | ORAL_TABLET | Freq: Four times a day (QID) | ORAL | Status: DC | PRN
  Administered 2021-06-19: 650 mg via ORAL
  Filled 2021-06-19: qty 2

## 2021-06-19 MED ORDER — FUROSEMIDE 10 MG/ML IJ SOLN
20.0000 mg | Freq: Two times a day (BID) | INTRAMUSCULAR | Status: DC
Start: 2021-06-19 — End: 2021-06-20
  Administered 2021-06-19 – 2021-06-20 (×3): 20 mg via INTRAVENOUS
  Filled 2021-06-19 (×3): qty 2

## 2021-06-19 NOTE — Progress Notes (Signed)
Lower extremity venous bilateral study completed.  Preliminary results relayed to Vann, DO.  See CV Proc for preliminary results report.   Damontay Alred, RDMS, RVT  

## 2021-06-19 NOTE — Progress Notes (Signed)
PROGRESS NOTE    Jerry Saunders  ANV:[id number] DOB: 1970-10-27 DOA: 06/18/2021 PCP: Pcp, No    Brief Narrative:  Jerry Saunders is a 51 y.o. male with medical history significant of type 2 diabetes not on meds, history of syncope, abnormal EKG, PVCs, and cardiac murmur last evaluated by cardiology at [facility] in 2018.  He presents to the ED complaining of shortness of breath, cough, orthopnea, and bilateral lower extremity edema.     Assessment and Plan: Suspected new onset CHF Patient presenting with dyspnea, cough, orthopnea, and bilateral lower extremity edema.  He has not seen a physician in several years.  BNP significantly elevated at 1295.  CT showing cardiomegaly, vascular congestion/possible early edema, small bilateral effusions.  He was last seen by cardiology at [facility] in 2018 for syncope and cardiac murmur but I am not able to see echo results under care everywhere. - Continue diuresis with IV Lasix 40 mg twice daily starting in the morning. -Echocardiogram pending -Monitor renal function -Monitor intake and output -Daily weights -Low-sodium diet with fluid restriction   Acute hypoxic respiratory failure Likely due to new onset CHF.  SPO2 90% on room air, currently requiring 2 L supplemental oxygen to maintain sats in the mid 90s. -Continue supplemental oxygen, wean as tolerated   Elevated D-dimer CTA chest negative for PE. -Bilateral lower extremity Dopplers ordered to rule out DVT.   Type 2 diabetes not on medications -A1c pending -Sensitive sliding scale insulin ACHS   Mild normocytic anemia Baseline hemoglobin unknown.  Patient is not endorsing any symptoms consistent with active bleed/GI bleed. -Continue to monitor   Mild elevation of transaminase Likely due to hepatic congestion in the setting of new onset heart failure. -Continue to monitor   Mild troponin elevation Likely due to demand ischemia.  Troponin mildly elevated but stable and not consistent with ACS.    Obesity Estimated body mass index is 34.67 kg/m as calculated from the following:   Height as of this encounter: 6\' 2"  (1.88 m).   Weight as of this encounter: 122.5 kg.    DVT prophylaxis: enoxaparin (LOVENOX) injection 40 mg Start: 06/18/21 2330    Code Status: Full Code Family Communication:   Disposition Plan:  Level of care: Telemetry Cardiac Status is: Inpatient Remains inpatient appropriate because: needs echo and IV diuresis    Consultants:  none   Subjective: Swelling in legs better  Objective: Vitals:   06/19/21 0353 06/19/21 0445 06/19/21 0530 06/19/21 0845  BP: 100/73 91/65 (!) 114/91 115/86  Pulse: (!) 112 (!) 103 100 100  Resp: (!) 22 (!) 30 (!) 29 20  Temp:      TempSrc:      SpO2: 100% 100% 94% 95%  Weight:      Height:        Intake/Output Summary (Last 24 hours) at 06/19/2021 1109 Last data filed at 06/19/2021 0955 Gross per 24 hour  Intake --  Output 2600 ml  Net -2600 ml   Filed Weights   06/18/21 1728  Weight: 122.5 kg    Examination:   General: Appearance:    Obese male in no acute distress     Lungs:     Diminished, respirations unlabored  Heart:    Tachycardic.    MS:   All extremities are intact. + LE Edema   Neurologic:   Awake, alert       Data Reviewed: I have personally reviewed following labs and imaging studies  CBC: Recent Labs  Lab  06/18/21 1735 06/19/21 0426  WBC 7.8 9.3  NEUTROABS 4.5  --   HGB 12.6* 12.3*  HCT 40.5 39.4  MCV 89.2 88.7  PLT 368 [id number]   Basic Metabolic Panel: Recent Labs  Lab 06/18/21 1735 06/19/21 0426  NA 138 139  K 4.1 4.1  CL 103 103  CO2 27 28  GLUCOSE 142* 192*  BUN 16 14  CREATININE 0.95 0.93  CALCIUM 8.7* 8.4*   GFR: Estimated Creatinine Clearance: 133.6 mL/min (by C-G formula based on SCr of 0.93 mg/dL). Liver Function Tests: Recent Labs  Lab 06/18/21 1735 06/19/21 0426  AST 24 21  ALT 53* 48*  ALKPHOS 81 78  BILITOT 1.2 1.0  PROT 7.0 6.5  ALBUMIN 3.5 3.3*    No results for input(s): "LIPASE", "AMYLASE" in the last 168 hours. No results for input(s): "AMMONIA" in the last 168 hours. Coagulation Profile: No results for input(s): "INR", "PROTIME" in the last 168 hours. Cardiac Enzymes: No results for input(s): "CKTOTAL", "CKMB", "CKMBINDEX", "TROPONINI" in the last 168 hours. BNP (last 3 results) No results for input(s): "PROBNP" in the last 8760 hours. HbA1C: Recent Labs    06/19/21 0045  HGBA1C 7.8*   CBG: Recent Labs  Lab 06/18/21 2352 06/19/21 0828  GLUCAP 140* 144*   Lipid Profile: No results for input(s): "CHOL", "HDL", "LDLCALC", "TRIG", "CHOLHDL", "LDLDIRECT" in the last 72 hours. Thyroid Function Tests: No results for input(s): "TSH", "T4TOTAL", "FREET4", "T3FREE", "THYROIDAB" in the last 72 hours. Anemia Panel: No results for input(s): "VITAMINB12", "FOLATE", "FERRITIN", "TIBC", "IRON", "RETICCTPCT" in the last 72 hours. Sepsis Labs: No results for input(s): "PROCALCITON", "LATICACIDVEN" in the last 168 hours.  No results found for this or any previous visit (from the past 240 hour(s)).       Radiology Studies: CT Angio Chest PE W and/or Wo Contrast  Result Date: 06/18/2021 CLINICAL DATA:  Shortness of breath, productive cough. Elevated D-dimer. EXAM: CT ANGIOGRAPHY CHEST WITH CONTRAST TECHNIQUE: Multidetector CT imaging of the chest was performed using the standard protocol during bolus administration of intravenous contrast. Multiplanar CT image reconstructions and MIPs were obtained to evaluate the vascular anatomy. RADIATION DOSE REDUCTION: This exam was performed according to the departmental dose-optimization program which includes automated exposure control, adjustment of the mA and/or kV according to patient size and/or use of iterative reconstruction technique. CONTRAST:  179mL OMNIPAQUE IOHEXOL 350 MG/ML SOLN COMPARISON:  None Available. FINDINGS: Cardiovascular: No filling defects in the pulmonary arteries to  suggest pulmonary emboli. Cardiomegaly. Aorta normal caliber. Mediastinum/Nodes: Borderline mediastinal lymph nodes throughout the mediastinum. No axillary or hilar adenopathy. Trachea and esophagus are unremarkable. Thyroid unremarkable. Lungs/Pleura: Small bilateral pleural effusions. Mild ground-glass opacities could reflect mild edema. Upper Abdomen: Scattered low-density lesions in the liver, likely cysts. No acute abnormality. Musculoskeletal: Chest wall soft tissues are unremarkable. No acute bony abnormality. Review of the MIP images confirms the above findings. IMPRESSION: No evidence of pulmonary embolus. Cardiomegaly, vascular congestion.  Possible early edema. Small bilateral effusions. Scattered borderline sized mediastinal lymph nodes, likely reactive. Electronically Signed   By: Rolm Baptise M.D.   On: 06/18/2021 21:01   DG Chest 2 View  Result Date: 06/18/2021 CLINICAL DATA:  Shortness of breath EXAM: CHEST - 2 VIEW COMPARISON:  None Available. FINDINGS: Gross cardiomegaly. Diffuse bilateral interstitial pulmonary opacity. Osseous structures unremarkable. IMPRESSION: Gross cardiomegaly with diffuse bilateral interstitial pulmonary opacity, likely edema. No focal airspace opacity. Electronically Signed   By: Delanna Ahmadi M.D.   On: 06/18/2021 18:17  Scheduled Meds:  enoxaparin (LOVENOX) injection  40 mg Subcutaneous Q24H   furosemide  40 mg Intravenous BID   insulin aspart  0-5 Units Subcutaneous QHS   insulin aspart  0-9 Units Subcutaneous TID WC   Continuous Infusions:   LOS: 1 day    Time spent: 55 minutes spent on chart review, discussion with nursing staff, consultants, updating family and interview/physical exam; more than 50% of that time was spent in counseling and/or coordination of care.    Geradine Girt, DO [facility] Available via Epic secure chat 7am-7pm After these hours, please refer to coverage provider listed on [email or web address] 06/19/2021, 11:09  AM

## 2021-06-19 NOTE — ED Notes (Signed)
Pt using the urinal at bedside. Patient spilled one half of urinal on the floor.

## 2021-06-19 NOTE — Progress Notes (Signed)
Echocardiogram 2D Echocardiogram has been performed.  Joette Catching 06/19/2021, 2:47 PM

## 2021-06-19 NOTE — ED Notes (Signed)
ED TO INPATIENT HANDOFF REPORT  ED Nurse Name and Phone #: [phone] [place] Name/Age/Gender Jerry Saunders 51 y.o. male Room/Bed: 008C/008C  Code Status   Code Status: Full Code  Home/SNF/Other Home Patient oriented to: self, place, time, and situation Is this baseline? Yes   Triage Complete: Triage complete  Chief Complaint CHF (congestive heart failure) (Jerry Saunders) [I50.9]  Triage Note Patient reports has had a cough for 3 weeks and then he started getting sob last week. Denies chest pain but is tachy in triage.    Allergies No Known Allergies  Level of Care/Admitting Diagnosis ED Disposition     ED Disposition  Admit   Condition  --   Comment  Hospital Area: [place] [[id number]]  Level of Care: Telemetry Cardiac [103]  May admit patient to [facility] or [facility] if equivalent level of care is available:: Yes  Covid Evaluation: Asymptomatic - no recent exposure (last 10 days) testing not required  Diagnosis: CH[facility] (congestive heart failure) [id number]  Admitting Physician: Shela Leff [id number]  Attending Physician: Shela Leff [id number]  Estimated length of stay: past midnight tomorrow  Certification:: I certify this patient will need inpatient services for at least 2 midnights          B Medical/Surgery History History reviewed. No pertinent past medical history. History reviewed. No pertinent surgical history.   A IV Location/Drains/Wounds Patient Lines/Drains/Airways Status     Active Line/Drains/Airways     Name Placement date Placement time Site Days   Peripheral IV 06/18/21 20 G Right Antecubital 06/18/21  2016  Antecubital  1            Intake/Output Last 24 hours  Intake/Output Summary (Last 24 hours) at 06/19/2021 1618 Last data filed at 06/19/2021 1119 Gross per 24 hour  Intake --  Output 4300 ml  Net -4300 ml    Labs/Imaging Results for orders placed or performed during the hospital  encounter of 06/18/21 (from the past 48 hour(s))  Comprehensive metabolic panel     Status: Abnormal   Collection Time: 06/18/21  5:35 PM  Result Value Ref Range   Sodium 138 135 - 145 mmol/L   Potassium 4.1 3.5 - 5.1 mmol/L   Chloride 103 98 - 111 mmol/L   CO2 27 22 - 32 mmol/L   Glucose, Bld 142 (H) 70 - 99 mg/dL    Comment: Glucose reference range applies only to samples taken after fasting for at least 8 hours.   BUN 16 6 - 20 mg/dL   Creatinine, Ser 0.95 0.61 - 1.24 mg/dL   Calcium 8.7 (L) 8.9 - 10.3 mg/dL   Total Protein 7.0 6.5 - 8.1 g/dL   Albumin 3.5 3.5 - 5.0 g/dL   AST 24 15 - 41 U/L   ALT 53 (H) 0 - 44 U/L   Alkaline Phosphatase 81 38 - 126 U/L   Total Bilirubin 1.2 0.3 - 1.2 mg/dL   GFR, Estimated >60 >60 mL/min    Comment: (NOTE) Calculated using the CKD-EPI Creatinine Equation (2021)    Anion gap 8 5 - 15    Comment: Performed at [facility], [place] [address], [place], [place] [address]  CBC with Differential     Status: Abnormal   Collection Time: 06/18/21  5:35 PM  Result Value Ref Range   WBC 7.8 4.0 - 10.5 K/uL   RBC 4.54 4.22 - 5.81 MIL/uL   Hemoglobin 12.6 (L) 13.0 - 17.0 g/dL  HCT 40.5 39.0 - 52.0 %   MCV 89.2 80.0 - 100.0 fL   MCH 27.8 26.0 - 34.0 pg   MCHC 31.1 30.0 - 36.0 g/dL   RDW 14.1 11.5 - 15.5 %   Platelets 368 150 - 400 K/uL   nRBC 0.0 0.0 - 0.2 %   Neutrophils Relative % 58 %   Neutro Abs 4.5 1.7 - 7.7 K/uL   Lymphocytes Relative 33 %   Lymphs Abs 2.6 0.7 - 4.0 K/uL   Monocytes Relative 8 %   Monocytes Absolute 0.6 0.1 - 1.0 K/uL   Eosinophils Relative 1 %   Eosinophils Absolute 0.1 0.0 - 0.5 K/uL   Basophils Relative 0 %   Basophils Absolute 0.0 0.0 - 0.1 K/uL   Immature Granulocytes 0 %   Abs Immature Granulocytes 0.01 0.00 - 0.07 K/uL    Comment: Performed at [facility] [address] [place], [place] [address]  Troponin I (High Sensitivity)     Status: Abnormal   Collection Time: 06/18/21  5:35 PM  Result Value  Ref Range   Troponin I (High Sensitivity) 45 (H) <18 ng/L    Comment: (NOTE) Elevated high sensitivity troponin I (hsTnI) values and significant  changes across serial measurements may suggest ACS but many other  chronic and acute conditions are known to elevate hsTnI results.  Refer to the "Links" section for chest pain algorithms and additional  guidance. Performed at [facility], [place] [address] [place], [place] [address]   D-dimer, quantitative     Status: Abnormal   Collection Time: 06/18/21  5:35 PM  Result Value Ref Range   D-Dimer, Quant 2.53 (H) 0.00 - 0.50 ug/mL-FEU    Comment: (NOTE) At the manufacturer cut-off value of 0.5 g/mL FEU, this assay has a negative predictive value of 95-100%.This assay is intended for use in conjunction with a clinical pretest probability (PTP) assessment model to exclude pulmonary embolism (PE) and deep venous thrombosis (DVT) in outpatients suspected of PE or DVT. Results should be correlated with clinical presentation. Performed at [facility], [place] [address], [place], [place] [address]   Brain natriuretic peptide     Status: Abnormal   Collection Time: 06/18/21  5:36 PM  Result Value Ref Range   B Natriuretic Peptide 1,295.7 (H) 0.0 - 100.0 pg/mL    Comment: Performed at [place] [address] [place], [place] [address]  Troponin I (High Sensitivity)     Status: Abnormal   Collection Time: 06/18/21  8:16 PM  Result Value Ref Range   Troponin I (High Sensitivity) 49 (H) <18 ng/L    Comment: (NOTE) Elevated high sensitivity troponin I (hsTnI) values and significant  changes across serial measurements may suggest ACS but many other  chronic and acute conditions are known to elevate hsTnI results.  Refer to the "Links" section for chest pain algorithms and additional  guidance. Performed at [facility], [place] [address] [place], [place] [address]   CBG monitoring, ED     Status: Abnormal   Collection  Time: 06/18/21 11:52 PM  Result Value Ref Range   Glucose-Capillary 140 (H) 70 - 99 mg/dL    Comment: Glucose reference range applies only to samples taken after fasting for at least 8 hours.  HIV Antibody (routine testing w rflx)     Status: None   Collection Time: 06/19/21 12:45 AM  Result Value Ref Range   HIV Screen 4th  Generation wRfx Non Reactive Non Reactive    Comment: Performed at [facility], [address], [place], [place] [address]  Hemoglobin A1c     Status: Abnormal   Collection Time: 06/19/21 12:45 AM  Result Value Ref Range   Hgb A1c MFr Bld 7.8 (H) 4.8 - 5.6 %    Comment: (NOTE) Pre diabetes:          5.7%-6.4%  Diabetes:              >6.4%  Glycemic control for   <7.0% adults with diabetes    Mean Plasma Glucose 177.16 mg/dL    Comment: Performed at [facility], [address] [place], [place] [address]  Comprehensive metabolic panel     Status: Abnormal   Collection Time: 06/19/21  4:26 AM  Result Value Ref Range   Sodium 139 135 - 145 mmol/L   Potassium 4.1 3.5 - 5.1 mmol/L   Chloride 103 98 - 111 mmol/L   CO2 28 22 - 32 mmol/L   Glucose, Bld 192 (H) 70 - 99 mg/dL    Comment: Glucose reference range applies only to samples taken after fasting for at least 8 hours.   BUN 14 6 - 20 mg/dL   Creatinine, Ser 2.50 0.61 - 1.24 mg/dL   Calcium 8.4 (L) 8.9 - 10.3 mg/dL   Total Protein 6.5 6.5 - 8.1 g/dL   Albumin 3.3 (L) 3.5 - 5.0 g/dL   AST 21 15 - 41 U/L   ALT 48 (H) 0 - 44 U/L   Alkaline Phosphatase 78 38 - 126 U/L   Total Bilirubin 1.0 0.3 - 1.2 mg/dL   GFR, Estimated >53 >97 mL/min    Comment: (NOTE) Calculated using the CKD-EPI Creatinine Equation (2021)    Anion gap 8 5 - 15    Comment: Performed at [facility], [address], [place], [place] [address]  CBC     Status: Abnormal   Collection Time: 06/19/21  4:26 AM  Result Value Ref Range   WBC 9.3 4.0 - 10.5 K/uL   RBC 4.44 4.22 - 5.81 MIL/uL   Hemoglobin 12.3 (L) 13.0 -  17.0 g/dL   HCT 93.7 90.2 - 40.9 %   MCV 88.7 80.0 - 100.0 fL   MCH 27.7 26.0 - 34.0 pg   MCHC 31.2 30.0 - 36.0 g/dL   RDW 73.5 32.9 - 92.4 %   Platelets 341 150 - 400 K/uL   nRBC 0.0 0.0 - 0.2 %    Comment: Performed at [facility], [address], [place], [place] [address]  CBG monitoring, ED     Status: Abnormal   Collection Time: 06/19/21  8:28 AM  Result Value Ref Range   Glucose-Capillary 144 (H) 70 - 99 mg/dL    Comment: Glucose reference range applies only to samples taken after fasting for at least 8 hours.  CBG monitoring, ED     Status: Abnormal   Collection Time: 06/19/21 11:47 AM  Result Value Ref Range   Glucose-Capillary 151 (H) 70 - 99 mg/dL    Comment: Glucose reference range applies only to samples taken after fasting for at least 8 hours.   ECHOCARDIOGRAM COMPLETE  Result Date: 06/19/2021    ECHOCARDIOGRAM REPORT   Patient Name:   ISILELI GATSON Date of Exam: 06/19/2021 Medical Rec #:  [id number]     Height:       74.0 in Accession #:  [id number]    Weight:       270.0 lb Date of Birth:  Jun 07, 1970     BSA:          2.469 m Patient Age:    13 years      BP:           104/72 mmHg Patient Gender: M             HR:           107 bpm. Exam Location:  Inpatient Procedure: 2D Echo, Cardiac Doppler, Color Doppler and Intracardiac            Opacification Agent Indications:    Congestive heart failure  History:        Patient has no prior history of Echocardiogram examinations.                 Abnormal ECG, Signs/Symptoms:Shortness of Breath and Cough; Risk                 Factors:Diabetes.  Sonographer:    Joette Catching RCS Referring Phys: [id number] [facility] RATHORE  Sonographer Comments: Image acquisition challenging due to patient body habitus. IMPRESSIONS  1. Left ventricular ejection fraction, by estimation, is <20%. The left ventricle has severely decreased function. The left ventricle demonstrates global hypokinesis. The left ventricular internal cavity size was  moderately to severely dilated. There is  mild left ventricular hypertrophy. Left ventricular diastolic parameters were normal.  2. Right ventricular systolic function is mildly reduced. The right ventricular size is not well visualized. There is normal pulmonary artery systolic pressure.  3. Left atrial size was mild to moderately dilated.  4. The mitral valve is normal in structure. Mild mitral valve regurgitation.  5. The aortic valve is tricuspid. Aortic valve regurgitation is not visualized.  6. Aortic no significant ascending aneurysm.  7. The inferior vena cava is dilated in size with >50% respiratory variability, suggesting right atrial pressure of 8 mmHg. Comparison(s): No prior Echocardiogram. FINDINGS  Left Ventricle: Left ventricular ejection fraction, by estimation, is <20%. The left ventricle has severely decreased function. The left ventricle demonstrates global hypokinesis. Definity contrast agent was given IV to delineate the left ventricular endocardial borders. The left ventricular internal cavity size was moderately to severely dilated. There is mild left ventricular hypertrophy. Left ventricular diastolic parameters were normal. Right Ventricle: The right ventricular size is not well visualized. Right vetricular wall thickness was not well visualized. Right ventricular systolic function is mildly reduced. There is normal pulmonary artery systolic pressure. The tricuspid regurgitant velocity is 2.40 m/s, and with an assumed right atrial pressure of 8 mmHg, the estimated right ventricular systolic pressure is 0000000 mmHg. Left Atrium: Left atrial size was mild to moderately dilated. Right Atrium: Right atrial size was normal in size. Pericardium: There is no evidence of pericardial effusion. Mitral Valve: The mitral valve is normal in structure. Mild mitral valve regurgitation. Tricuspid Valve: The tricuspid valve is normal in structure. Tricuspid valve regurgitation is trivial. Aortic Valve: The  aortic valve is tricuspid. Aortic valve regurgitation is not visualized. Aortic valve mean gradient measures 4.0 mmHg. Aortic valve peak gradient measures 5.4 mmHg. Aortic valve area, by VTI measures 2.57 cm. Pulmonic Valve: The pulmonic valve was not well visualized. Pulmonic valve regurgitation is not visualized. Aorta: No significant ascending aneurysm. Venous: The inferior vena cava is dilated in size with greater than 50% respiratory variability, suggesting right atrial pressure of 8 mmHg.  LEFT VENTRICLE PLAX 2D LVIDd:  6.70 cm   Diastology LVIDs:         6.20 cm   LV e' medial:    5.35 cm/s LV PW:         1.20 cm   LV E/e' medial:  18.4 LV IVS:        1.10 cm   LV e' lateral:   14.80 cm/s LVOT diam:     2.10 cm   LV E/e' lateral: 6.7 LV SV:         56 LV SV Index:   23 LVOT Area:     3.46 cm  RIGHT VENTRICLE             IVC RV Basal diam:  5.80 cm     IVC diam: 2.80 cm RV Mid diam:    4.10 cm RV S prime:     15.50 cm/s TAPSE (M-mode): 1.5 cm LEFT ATRIUM              Index        RIGHT ATRIUM           Index LA diam:        5.70 cm  2.31 cm/m   RA Area:     26.20 cm LA Vol (A2C):   149.0 ml 60.35 ml/m  RA Volume:   94.70 ml  38.35 ml/m LA Vol (A4C):   82.0 ml  33.21 ml/m LA Biplane Vol: 116.0 ml 46.98 ml/m  AORTIC VALVE                    PULMONIC VALVE AV Area (Vmax):    3.49 cm     PV Vmax:          0.93 m/s AV Area (Vmean):   3.06 cm     PV Peak grad:     3.5 mmHg AV Area (VTI):     2.57 cm     PR End Diast Vel: 11.56 msec AV Vmax:           116.00 cm/s AV Vmean:          97.100 cm/s AV VTI:            0.218 m AV Peak Grad:      5.4 mmHg AV Mean Grad:      4.0 mmHg LVOT Vmax:         117.00 cm/s LVOT Vmean:        85.900 cm/s LVOT VTI:          0.162 m LVOT/AV VTI ratio: 0.74  AORTA Ao Root diam: 3.60 cm Ao Asc diam:  3.30 cm MITRAL VALVE               TRICUSPID VALVE MV Area (PHT): 7.90 cm    TR Peak grad:   23.0 mmHg MV Decel Time: 96 msec     TR Vmax:        240.00 cm/s MR Peak grad:  58.4 mmHg MR Mean grad: 41.0 mmHg    SHUNTS MR Vmax:      382.00 cm/s  Systemic VTI:  0.16 m MR Vmean:     311.0 cm/s   Systemic Diam: 2.10 cm MV E velocity: 98.50 cm/s MV A velocity: 52.50 cm/s MV E/A ratio:  1.88 Mary Scientist, physiological signed by Phineas Inches Signature Date/Time: 06/19/2021/3:02:05 PM    Final    VAS [place] LOWER EXTREMITY VENOUS (DVT)  Result Date: 06/19/2021  Lower Venous DVT Study Patient  Name:  ROBET MARTENSON  Date of Exam:   06/19/2021 Medical Rec #: [id number]      Accession #:    [id number] Date of Birth: 11/06/1970      Patient Gender: M Patient Age:   33 years Exam Location:  [facility] Procedure:      VAS [place] LOWER EXTREMITY VENOUS (DVT) Referring Phys: Wandra Feinstein RATHORE --------------------------------------------------------------------------------  Indications: Edema, shortness of breath, elevated d-dimer.  Comparison Study: No prior studies. Performing Technologist: Darlin Coco RDMS, RVT  Examination Guidelines: A complete evaluation includes B-mode imaging, spectral Doppler, color Doppler, and power Doppler as needed of all accessible portions of each vessel. Bilateral testing is considered an integral part of a complete examination. Limited examinations for reoccurring indications may be performed as noted. The reflux portion of the exam is performed with the patient in reverse Trendelenburg.  +---------+---------------+---------+-----------+----------+--------------+ RIGHT    CompressibilityPhasicitySpontaneityPropertiesThrombus Aging +---------+---------------+---------+-----------+----------+--------------+ CFV      Full           No       Yes                                 +---------+---------------+---------+-----------+----------+--------------+ SFJ      Full                                                        +---------+---------------+---------+-----------+----------+--------------+ FV Prox  Full                                                         +---------+---------------+---------+-----------+----------+--------------+ FV Mid   Full                                                        +---------+---------------+---------+-----------+----------+--------------+ FV DistalFull                                                        +---------+---------------+---------+-----------+----------+--------------+ PFV      Full                                                        +---------+---------------+---------+-----------+----------+--------------+ POP      Full           No       Yes                                 +---------+---------------+---------+-----------+----------+--------------+ PTV      Full                                                        +---------+---------------+---------+-----------+----------+--------------+  PERO     Full                                                        +---------+---------------+---------+-----------+----------+--------------+ Gastroc  Full                                                        +---------+---------------+---------+-----------+----------+--------------+   +---------+---------------+---------+-----------+----------+-----------------+ LEFT     CompressibilityPhasicitySpontaneityPropertiesThrombus Aging    +---------+---------------+---------+-----------+----------+-----------------+ CFV      Full           No       Yes                                    +---------+---------------+---------+-----------+----------+-----------------+ SFJ      Full                                                           +---------+---------------+---------+-----------+----------+-----------------+ FV Prox  Full                                                           +---------+---------------+---------+-----------+----------+-----------------+ FV Mid   Full                                                            +---------+---------------+---------+-----------+----------+-----------------+ FV DistalFull                                                           +---------+---------------+---------+-----------+----------+-----------------+ PFV      Full                                                           +---------+---------------+---------+-----------+----------+-----------------+ POP      Full           No       Yes                                    +---------+---------------+---------+-----------+----------+-----------------+ PTV      Full                                                           +---------+---------------+---------+-----------+----------+-----------------+  PERO     Full                                                           +---------+---------------+---------+-----------+----------+-----------------+ Gastroc  None           No       No                   Age Indeterminate +---------+---------------+---------+-----------+----------+-----------------+     Summary: RIGHT: - There is no evidence of deep vein thrombosis in the lower extremity.  - No cystic structure found in the popliteal fossa.  LEFT: - Findings consistent with age indeterminate deep vein thrombosis involving the left gastrocnemius veins.  - No cystic structure found in the popliteal fossa.  *See table(s) above for measurements and observations.    Preliminary    CT Angio Chest PE W and/or Wo Contrast  Result Date: 06/18/2021 CLINICAL DATA:  Shortness of breath, productive cough. Elevated D-dimer. EXAM: CT ANGIOGRAPHY CHEST WITH CONTRAST TECHNIQUE: Multidetector CT imaging of the chest was performed using the standard protocol during bolus administration of intravenous contrast. Multiplanar CT image reconstructions and MIPs were obtained to evaluate the vascular anatomy. RADIATION DOSE REDUCTION: This exam was performed according to the departmental dose-optimization program which  includes automated exposure control, adjustment of the mA and/or kV according to patient size and/or use of iterative reconstruction technique. CONTRAST:  160mL OMNIPAQUE IOHEXOL 350 MG/ML SOLN COMPARISON:  None Available. FINDINGS: Cardiovascular: No filling defects in the pulmonary arteries to suggest pulmonary emboli. Cardiomegaly. Aorta normal caliber. Mediastinum/Nodes: Borderline mediastinal lymph nodes throughout the mediastinum. No axillary or hilar adenopathy. Trachea and esophagus are unremarkable. Thyroid unremarkable. Lungs/Pleura: Small bilateral pleural effusions. Mild ground-glass opacities could reflect mild edema. Upper Abdomen: Scattered low-density lesions in the liver, likely cysts. No acute abnormality. Musculoskeletal: Chest wall soft tissues are unremarkable. No acute bony abnormality. Review of the MIP images confirms the above findings. IMPRESSION: No evidence of pulmonary embolus. Cardiomegaly, vascular congestion.  Possible early edema. Small bilateral effusions. Scattered borderline sized mediastinal lymph nodes, likely reactive. Electronically Signed   By: Rolm Baptise M.D.   On: 06/18/2021 21:01   DG Chest 2 View  Result Date: 06/18/2021 CLINICAL DATA:  Shortness of breath EXAM: CHEST - 2 VIEW COMPARISON:  None Available. FINDINGS: Gross cardiomegaly. Diffuse bilateral interstitial pulmonary opacity. Osseous structures unremarkable. IMPRESSION: Gross cardiomegaly with diffuse bilateral interstitial pulmonary opacity, likely edema. No focal airspace opacity. Electronically Signed   By: Delanna Ahmadi M.D.   On: 06/18/2021 18:17    Pending Labs Unresulted Labs (From admission, onward)     Start     Ordered   06/20/21 0500  CBC  Tomorrow morning,   R        06/19/21 1112   06/20/21 [id number]  Basic metabolic panel  Tomorrow morning,   R        06/19/21 1112            Vitals/Pain Today's Vitals   06/19/21 0530 06/19/21 0845 06/19/21 1100 06/19/21 1315  BP: (!) 114/91  115/86 (!) 158/114 114/87  Pulse: 100 100 (!) 128 (!) 159  Resp: (!) 29 20 (!) 25 (!) 22  Temp:      TempSrc:      SpO2: 94% 95% 96%  95%  Weight:      Height:      PainSc:        Isolation Precautions No active isolations  Medications Medications  enoxaparin (LOVENOX) injection 40 mg (40 mg Subcutaneous Given 06/19/21 0044)  insulin aspart (novoLOG) injection 0-9 Units (2 Units Subcutaneous Given 06/19/21 1213)  insulin aspart (novoLOG) injection 0-5 Units ( Subcutaneous Not Given 06/18/21 2354)  acetaminophen (TYLENOL) tablet 650 mg (has no administration in time range)  furosemide (LASIX) injection 20 mg (has no administration in time range)  metoprolol tartrate (LOPRESSOR) injection 5 mg (0 mg Intravenous Hold 06/19/21 1329)  perflutren lipid microspheres (DEFINITY) IV suspension (3 mLs Intravenous Given 06/19/21 1447)  iohexol (OMNIPAQUE) 350 MG/ML injection 100 mL (100 mLs Intravenous Contrast Given 06/18/21 2054)  furosemide (LASIX) injection 40 mg (40 mg Intravenous Given 06/18/21 2103)    Mobility walks Low fall risk   Focused Assessments Cardiac Assessment Handoff:    No results found for: "CKTOTAL", "CKMB", "CKMBINDEX", "TROPONINI" Lab Results  Component Value Date   DDIMER 2.53 (H) 06/18/2021   Does the Patient currently have chest pain? No    R Recommendations: See Admitting Provider Note  Report given to:   Additional Notes:

## 2021-06-20 ENCOUNTER — Encounter (HOSPITAL_COMMUNITY)
Admission: EM | Disposition: A | Discharge status: Home / Self Care | Payer: Self-pay | Source: Home / Self Care | Attending: Internal Medicine

## 2021-06-20 ENCOUNTER — Other Ambulatory Visit (HOSPITAL_COMMUNITY): Payer: Self-pay

## 2021-06-20 ENCOUNTER — Encounter (HOSPITAL_COMMUNITY): Payer: Self-pay | Admitting: Cardiology

## 2021-06-20 DIAGNOSIS — I82409 Acute embolism and thrombosis of unspecified deep veins of unspecified lower extremity: Secondary | ICD-10-CM

## 2021-06-20 DIAGNOSIS — I5020 Unspecified systolic (congestive) heart failure: Secondary | ICD-10-CM

## 2021-06-20 DIAGNOSIS — I5041 Acute combined systolic (congestive) and diastolic (congestive) heart failure: Secondary | ICD-10-CM

## 2021-06-20 HISTORY — PX: RIGHT/LEFT HEART CATH AND CORONARY ANGIOGRAPHY: CATH118266

## 2021-06-20 LAB — RAPID URINE DRUG SCREEN, HOSP PERFORMED
Amphetamines: NOT DETECTED
Barbiturates: NOT DETECTED
Benzodiazepines: NOT DETECTED
Cocaine: NOT DETECTED
Opiates: NOT DETECTED
Tetrahydrocannabinol: NOT DETECTED

## 2021-06-20 LAB — RESPIRATORY PANEL BY PCR

## 2021-06-20 LAB — CBC
HCT: 38.7 % — ABNORMAL LOW (ref 39.0–52.0)
Hemoglobin: 12.2 g/dL — ABNORMAL LOW (ref 13.0–17.0)
MCH: 27.5 pg (ref 26.0–34.0)
MCHC: 31.5 g/dL (ref 30.0–36.0)
MCV: 87.4 fL (ref 80.0–100.0)
Platelets: 311 10*3/uL (ref 150–400)
RBC: 4.43 MIL/uL (ref 4.22–5.81)
RDW: 14.2 % (ref 11.5–15.5)
WBC: 8.1 10*3/uL (ref 4.0–10.5)
nRBC: 0 % (ref 0.0–0.2)

## 2021-06-20 LAB — POCT I-STAT EG7
Acid-Base Excess: 8 mmol/L — ABNORMAL HIGH (ref 0.0–2.0)
Bicarbonate: 33.8 mmol/L — ABNORMAL HIGH (ref 20.0–28.0)
Calcium, Ion: 1.1 mmol/L — ABNORMAL LOW (ref 1.15–1.40)
HCT: 40 % (ref 39.0–52.0)
Hemoglobin: 13.6 g/dL (ref 13.0–17.0)
O2 Saturation: 63 %
Potassium: 3.7 mmol/L (ref 3.5–5.1)
Sodium: 143 mmol/L (ref 135–145)
TCO2: 35 mmol/L — ABNORMAL HIGH (ref 22–32)
pCO2, Ven: 50.7 mmHg (ref 44–60)
pH, Ven: 7.432 — ABNORMAL HIGH (ref 7.25–7.43)
pO2, Ven: 32 mmHg (ref 32–45)

## 2021-06-20 LAB — BASIC METABOLIC PANEL
Anion gap: 9 (ref 5–15)
BUN: 14 mg/dL (ref 6–20)
CO2: 29 mmol/L (ref 22–32)
Calcium: 8.2 mg/dL — ABNORMAL LOW (ref 8.9–10.3)
Chloride: 103 mmol/L (ref 98–111)
Creatinine, Ser: 0.78 mg/dL (ref 0.61–1.24)
GFR, Estimated: >60 mL/min (ref >60)
Glucose, Bld: 138 mg/dL — ABNORMAL HIGH (ref 70–99)
Potassium: 3.6 mmol/L (ref 3.5–5.1)
Sodium: 141 mmol/L (ref 135–145)

## 2021-06-20 LAB — POCT I-STAT 7, (LYTES, BLD GAS, ICA,H+H)
Acid-Base Excess: 6 mmol/L — ABNORMAL HIGH (ref 0.0–2.0)
Bicarbonate: 30.9 mmol/L — ABNORMAL HIGH (ref 20.0–28.0)
Calcium, Ion: 1.07 mmol/L — ABNORMAL LOW (ref 1.15–1.40)
HCT: 38 % — ABNORMAL LOW (ref 39.0–52.0)
Hemoglobin: 12.9 g/dL — ABNORMAL LOW (ref 13.0–17.0)
O2 Saturation: 97 %
Potassium: 3.5 mmol/L (ref 3.5–5.1)
Sodium: 143 mmol/L (ref 135–145)
TCO2: 32 mmol/L (ref 22–32)
pCO2 arterial: 43.6 mmHg (ref 32–48)
pH, Arterial: 7.459 — ABNORMAL HIGH (ref 7.35–7.45)
pO2, Arterial: 84 mmHg (ref 83–108)

## 2021-06-20 LAB — MAGNESIUM: Magnesium: 2.1 mg/dL (ref 1.7–2.4)

## 2021-06-20 LAB — GLUCOSE, CAPILLARY
Glucose-Capillary: 147 mg/dL — ABNORMAL HIGH (ref 70–99)
Glucose-Capillary: 167 mg/dL — ABNORMAL HIGH (ref 70–99)

## 2021-06-20 LAB — TSH: TSH: 1.26 u[IU]/mL (ref 0.350–4.500)

## 2021-06-20 LAB — MRSA NEXT GEN BY PCR, NASAL: MRSA by PCR Next Gen: NOT DETECTED

## 2021-06-20 SURGERY — RIGHT/LEFT HEART CATH AND CORONARY ANGIOGRAPHY
Anesthesia: LOCAL

## 2021-06-20 MED ORDER — VERAPAMIL HCL 2.5 MG/ML IV SOLN
INTRAVENOUS | Status: AC
  Filled 2021-06-20: qty 2

## 2021-06-20 MED ORDER — MIDAZOLAM HCL 2 MG/2ML IJ SOLN
INTRAMUSCULAR | Status: AC
  Filled 2021-06-20: qty 2

## 2021-06-20 MED ORDER — ADENOSINE 6 MG/2ML IV SOLN
INTRAVENOUS | Status: AC
Start: 2021-06-20 — End: ?
  Filled 2021-06-20: qty 4

## 2021-06-20 MED ORDER — ONDANSETRON HCL 4 MG/2ML IJ SOLN: 4.0000 mg | Freq: Four times a day (QID) | INTRAMUSCULAR | Status: DC | PRN

## 2021-06-20 MED ORDER — DAPAGLIFLOZIN PROPANEDIOL 10 MG PO TABS
10.0000 mg | ORAL_TABLET | Freq: Every day | ORAL | Status: DC
  Administered 2021-06-20 – 2021-06-23 (×4): 10 mg via ORAL
  Filled 2021-06-20 (×4): qty 1

## 2021-06-20 MED ORDER — FENTANYL CITRATE (PF) 100 MCG/2ML IJ SOLN
INTRAMUSCULAR | Status: DC | PRN
  Administered 2021-06-20: 25 ug via INTRAVENOUS

## 2021-06-20 MED ORDER — APIXABAN 5 MG PO TABS
10.0000 mg | ORAL_TABLET | Freq: Two times a day (BID) | ORAL | Status: DC
Start: 2021-06-20 — End: 2021-06-23
  Administered 2021-06-20 – 2021-06-23 (×6): 10 mg via ORAL
  Filled 2021-06-20 (×6): qty 2

## 2021-06-20 MED ORDER — SODIUM CHLORIDE 0.9% FLUSH
3.0000 mL | INTRAVENOUS | Status: DC | PRN
Start: 2021-06-20 — End: 2021-06-23

## 2021-06-20 MED ORDER — MIDAZOLAM HCL 2 MG/2ML IJ SOLN
INTRAMUSCULAR | Status: DC | PRN
  Administered 2021-06-20: 1 mg via INTRAVENOUS

## 2021-06-20 MED ORDER — SODIUM CHLORIDE 0.9% FLUSH: 3.0000 mL | INTRAVENOUS | Status: DC | PRN

## 2021-06-20 MED ORDER — ADENOSINE 6 MG/2ML IV SOLN
INTRAVENOUS | Status: DC | PRN
  Administered 2021-06-20: 12 mg via INTRAVENOUS
  Administered 2021-06-20: 6 mg via INTRAVENOUS

## 2021-06-20 MED ORDER — POTASSIUM CHLORIDE CRYS ER 20 MEQ PO TBCR
40.0000 meq | EXTENDED_RELEASE_TABLET | Freq: Four times a day (QID) | ORAL | Status: AC
  Administered 2021-06-20 (×2): 40 meq via ORAL
  Filled 2021-06-20 (×2): qty 2

## 2021-06-20 MED ORDER — FENTANYL CITRATE (PF) 100 MCG/2ML IJ SOLN
INTRAMUSCULAR | Status: AC
  Filled 2021-06-20: qty 2

## 2021-06-20 MED ORDER — FUROSEMIDE 10 MG/ML IJ SOLN
40.0000 mg | Freq: Two times a day (BID) | INTRAMUSCULAR | Status: DC
  Administered 2021-06-20: 20 mg via INTRAVENOUS
  Administered 2021-06-21: 40 mg via INTRAVENOUS
  Filled 2021-06-20 (×2): qty 4

## 2021-06-20 MED ORDER — VERAPAMIL HCL 2.5 MG/ML IV SOLN
INTRAVENOUS | Status: DC | PRN
  Administered 2021-06-20: 10 mL via INTRA_ARTERIAL

## 2021-06-20 MED ORDER — LORAZEPAM 2 MG/ML IJ SOLN
1.0000 mg | Freq: Once | INTRAMUSCULAR | Status: AC
  Administered 2021-06-22: 1 mg via INTRAVENOUS
  Filled 2021-06-20: qty 1

## 2021-06-20 MED ORDER — LIDOCAINE HCL (PF) 1 % IJ SOLN
INTRAMUSCULAR | Status: DC | PRN
  Administered 2021-06-20 (×2): 2 mL

## 2021-06-20 MED ORDER — IVABRADINE HCL 5 MG PO TABS
5.0000 mg | ORAL_TABLET | Freq: Two times a day (BID) | ORAL | Status: DC
  Administered 2021-06-20 – 2021-06-23 (×6): 5 mg via ORAL
  Filled 2021-06-20 (×7): qty 1

## 2021-06-20 MED ORDER — SODIUM CHLORIDE 0.9 % IV SOLN: INTRAVENOUS | Status: DC

## 2021-06-20 MED ORDER — HEPARIN (PORCINE) 25000 UT/250ML-% IV SOLN
1550.0000 [IU]/h | INTRAVENOUS | Status: DC
  Administered 2021-06-20: 1550 [IU]/h via INTRAVENOUS
  Filled 2021-06-20: qty 250

## 2021-06-20 MED ORDER — SODIUM CHLORIDE 0.9% FLUSH
3.0000 mL | Freq: Two times a day (BID) | INTRAVENOUS | Status: DC
  Administered 2021-06-20 – 2021-06-23 (×5): 3 mL via INTRAVENOUS

## 2021-06-20 MED ORDER — ACETAMINOPHEN 325 MG PO TABS
650.0000 mg | ORAL_TABLET | ORAL | Status: DC | PRN
  Administered 2021-06-22: 650 mg via ORAL
  Filled 2021-06-20: qty 2

## 2021-06-20 MED ORDER — SODIUM CHLORIDE 0.9 % IV SOLN
250.0000 mL | INTRAVENOUS | Status: DC | PRN
Start: 2021-06-20 — End: 2021-06-20

## 2021-06-20 MED ORDER — APIXABAN 5 MG PO TABS: 5.0000 mg | ORAL_TABLET | Freq: Two times a day (BID) | ORAL | Status: DC

## 2021-06-20 MED ORDER — SODIUM CHLORIDE 0.9 % IV SOLN: 250.0000 mL | INTRAVENOUS | Status: DC | PRN

## 2021-06-20 MED ORDER — HEPARIN (PORCINE) IN NACL 1000-0.9 UT/500ML-% IV SOLN
INTRAVENOUS | Status: AC
  Filled 2021-06-20: qty 1000

## 2021-06-20 MED ORDER — ADENOSINE 6 MG/2ML IV SOLN
INTRAVENOUS | Status: AC
Start: 2021-06-20 — End: ?
  Filled 2021-06-20: qty 2

## 2021-06-20 MED ORDER — SODIUM CHLORIDE 0.9% FLUSH
3.0000 mL | Freq: Two times a day (BID) | INTRAVENOUS | Status: DC
  Administered 2021-06-20 – 2021-06-23 (×6): 3 mL via INTRAVENOUS

## 2021-06-20 MED ORDER — ASPIRIN 81 MG PO CHEW
81.0000 mg | CHEWABLE_TABLET | ORAL | Status: AC
  Administered 2021-06-20: 81 mg via ORAL
  Filled 2021-06-20: qty 1

## 2021-06-20 MED ORDER — HEPARIN BOLUS VIA INFUSION
4000.0000 [IU] | Freq: Once | INTRAVENOUS | Status: AC
  Administered 2021-06-20: 4000 [IU] via INTRAVENOUS
  Filled 2021-06-20: qty 4000

## 2021-06-20 MED ORDER — LABETALOL HCL 5 MG/ML IV SOLN: 10.0000 mg | INTRAVENOUS | Status: AC | PRN

## 2021-06-20 MED ORDER — HEPARIN SODIUM (PORCINE) 1000 UNIT/ML IJ SOLN
INTRAMUSCULAR | Status: DC | PRN
  Administered 2021-06-20: 6000 [IU] via INTRAVENOUS

## 2021-06-20 MED ORDER — ASPIRIN 81 MG PO CHEW: 81.0000 mg | CHEWABLE_TABLET | ORAL | Status: DC

## 2021-06-20 MED ORDER — HYDRALAZINE HCL 20 MG/ML IJ SOLN
10.0000 mg | INTRAMUSCULAR | Status: AC | PRN
Start: 2021-06-20 — End: 2021-06-20

## 2021-06-20 MED ORDER — HEPARIN (PORCINE) IN NACL 1000-0.9 UT/500ML-% IV SOLN
INTRAVENOUS | Status: DC | PRN
  Administered 2021-06-20 (×2): 500 mL

## 2021-06-20 MED ORDER — LIDOCAINE HCL (PF) 1 % IJ SOLN
INTRAMUSCULAR | Status: AC
  Filled 2021-06-20: qty 30

## 2021-06-20 SURGICAL SUPPLY — 13 items
BAND ZEPHYR COMPRESS 30 LONG (HEMOSTASIS) ×1 IMPLANT
CATH BALLN WEDGE 5F 110CM (CATHETERS) ×1 IMPLANT
CATH INFINITI 5 FR STR PIGTAIL (CATHETERS) ×1 IMPLANT
CATH INFINITI 5FR ANG PIGTAIL (CATHETERS) ×1 IMPLANT
CATH OPTITORQUE TIG 4.0 5F (CATHETERS) ×1 IMPLANT
GLIDESHEATH SLEND A-KIT 6F 22G (SHEATH) ×1 IMPLANT
GUIDEWIRE INQWIRE 1.5J.035X260 (WIRE) IMPLANT
INQWIRE 1.5J .035X260CM (WIRE) ×2
KIT HEART LEFT (KITS) ×2 IMPLANT
PACK CARDIAC CATHETERIZATION (CUSTOM PROCEDURE TRAY) ×2 IMPLANT
SHEATH GLIDE SLENDER 4/5FR (SHEATH) ×1 IMPLANT
TRANSDUCER W/STOPCOCK (MISCELLANEOUS) ×2 IMPLANT
TUBING CIL FLEX 10 FLL-RA (TUBING) ×2 IMPLANT

## 2021-06-20 NOTE — TOC Benefit Eligibility Note (Signed)
Patient Advocate Encounter   Unable to find ins covg for this pt at this time.

## 2021-06-20 NOTE — Progress Notes (Signed)
PROGRESS NOTE    Jerry Saunders  [id number] DOB: 23-Nov-1970 DOA: 06/18/2021 PCP: Pcp, No   Brief Narrative: 51 year old with past medical history significant for type 2 diabetes not on medication, history of syncope, abnormal EKG, PVC and cardiac murmur last evaluated by cardiology at [facility] 2018.  He presented complaining of worsening shortness of breath, cough, orthopnea, bilateral lower extremity edema.  He was found to have acute systolic heart failure exacerbation, echo with ejection fraction 20%.  Cardiology consulted planning for cardiac cath.  He was also found to have left age-indeterminate gastrocnemius   vein DVT. He was started on heparin gtt.    Assessment & Plan:   Principal Problem:   CHF (congestive heart failure) (HCC) Active Problems:   Acute respiratory failure with hypoxia (HCC)   Elevated d-dimer   Type 2 diabetes mellitus (HCC)   Normocytic anemia   1-Acute Systolic and Diastolic  Heart Failure Exacerbation:  Echo 6/12: Ejection fraction less than 20%.  Global hypokinesis.  Right ventricular systolic function is mildly reduced. Radiology consulted and planning heart cath today. Cardiology planning a statin Corlanor and Farxiga Continue with IV Lasix For dilated cardiomyopathy might need cardiac MRI  Diabetes type 2: A1c 7.8 SSI   Left gastrocnemius vein DVT indeterminate. Started heparin drip.  Will need to  transition to Eliquis  Acute hypoxic respiratory failure: In the setting of acute systolic and diastolic heart failure exacerbation.  Continue with IV Lasix Wean off oxygen as needed  Transaminases: Likely hepatic congestion in the setting of heart failure exacerbation. Trending down.   Mild normocytic anemia: Monitor hemoglobin  Elevation troponin. : Cardiology following  Obesity: Needs lifestyle modification   Estimated body mass index is 37.08 kg/m as calculated from the following:   Height as of this encounter: 6\' 2"  (1.88 m).    Weight as of this encounter: 131 kg.   DVT prophylaxis: heparin  Code Status: full code Family Communication:care discussed with patient Disposition Plan:  Status is: Inpatient Remains inpatient appropriate because: remain inpatient for management of HF    Consultants:  Dr Terri Skains, cardiology   Procedures:  ECHO; ef 20 %  Antimicrobials:    Subjective: He report improvement of SOB, still having cough.    Objective: Vitals:   06/20/21 0000 06/20/21 0410 06/20/21 0500 06/20/21 0757  BP:  109/72  117/90  Pulse: (!) 103 (!) 105  93  Resp:  20  20  Temp:  98 F (36.7 C)  98.3 F (36.8 C)  TempSrc:  Oral  Oral  SpO2:  96%  96%  Weight:   131 kg   Height:        Intake/Output Summary (Last 24 hours) at 06/20/2021 0948 Last data filed at 06/20/2021 0926 Gross per 24 hour  Intake 240 ml  Output 4175 ml  Net -3935 ml   Filed Weights   06/18/21 1728 06/19/21 2146 06/20/21 0500  Weight: 122.5 kg 131.6 kg 131 kg    Examination:  General exam: Appears calm and comfortable  Respiratory system: BL crackles.  Cardiovascular system: S1 & S2 heard, RRR.  Gastrointestinal system: Abdomen is nondistended, soft and nontender. No organomegaly or masses felt. Normal bowel sounds heard. Central nervous system: Alert and oriented. No focal neurological deficits. Extremities: Symmetric 5 x 5 power.    Data Reviewed: I have personally reviewed following labs and imaging studies  CBC: Recent Labs  Lab 06/18/21 1735 06/19/21 0426 06/20/21 0407  WBC 7.8 9.3 8.1  NEUTROABS 4.5  --   --  HGB 12.6* 12.3* 12.2*  HCT 40.5 39.4 38.7*  MCV 89.2 88.7 87.4  PLT 368 341 [id number]   Basic Metabolic Panel: Recent Labs  Lab 06/18/21 1735 06/19/21 0426 06/20/21 0407  NA 138 139 141  K 4.1 4.1 3.6  CL 103 103 103  CO2 27 28 29   GLUCOSE 142* 192* 138*  BUN 16 14 14   CREATININE 0.95 0.93 0.78  CALCIUM 8.7* 8.4* 8.2*   GFR: Estimated Creatinine Clearance: 160.7 mL/min (by C-G  formula based on SCr of 0.78 mg/dL). Liver Function Tests: Recent Labs  Lab 06/18/21 1735 06/19/21 0426  AST 24 21  ALT 53* 48*  ALKPHOS 81 78  BILITOT 1.2 1.0  PROT 7.0 6.5  ALBUMIN 3.5 3.3*   No results for input(s): "LIPASE", "AMYLASE" in the last 168 hours. No results for input(s): "AMMONIA" in the last 168 hours. Coagulation Profile: No results for input(s): "INR", "PROTIME" in the last 168 hours. Cardiac Enzymes: No results for input(s): "CKTOTAL", "CKMB", "CKMBINDEX", "TROPONINI" in the last 168 hours. BNP (last 3 results) No results for input(s): "PROBNP" in the last 8760 hours. HbA1C: Recent Labs    06/19/21 0045  HGBA1C 7.8*   CBG: Recent Labs  Lab 06/19/21 0828 06/19/21 1147 06/19/21 1721 06/19/21 2208 06/20/21 0608  GLUCAP 144* 151* 152* 137* 147*   Lipid Profile: No results for input(s): "CHOL", "HDL", "LDLCALC", "TRIG", "CHOLHDL", "LDLDIRECT" in the last 72 hours. Thyroid Function Tests: No results for input(s): "TSH", "T4TOTAL", "FREET4", "T3FREE", "THYROIDAB" in the last 72 hours. Anemia Panel: No results for input(s): "VITAMINB12", "FOLATE", "FERRITIN", "TIBC", "IRON", "RETICCTPCT" in the last 72 hours. Sepsis Labs: No results for input(s): "PROCALCITON", "LATICACIDVEN" in the last 168 hours.  Recent Results (from the past 240 hour(s))  MRSA Next Gen by PCR, Nasal     Status: None   Collection Time: 06/19/21  9:24 PM   Specimen: Nasal Mucosa; Nasal Swab  Result Value Ref Range Status   MRSA by PCR Next Gen NOT DETECTED NOT DETECTED Final    Comment: (NOTE) The GeneXpert MRSA Assay (FDA approved for NASAL specimens only), is one component of a comprehensive MRSA colonization surveillance program. It is not intended to diagnose MRSA infection nor to guide or monitor treatment for MRSA infections. Test performance is not FDA approved in patients less than 22 years old. Performed at [facility], [place] [address], [place],  [place] [address]          Radiology Studies: VAS [place] LOWER EXTREMITY VENOUS (DVT)  Result Date: 06/19/2021  Lower Venous DVT Study Patient Name:  Jerry Saunders  Date of Exam:   06/19/2021 Medical Rec #: [id number]      Accession #:    [id number] Date of Birth: 1970/03/16      Patient Gender: M Patient Age:   39 years Exam Location:  [facility] Procedure:      VAS [place] LOWER EXTREMITY VENOUS (DVT) Referring Phys: Wandra Feinstein RATHORE --------------------------------------------------------------------------------  Indications: Edema, shortness of breath, elevated d-dimer.  Comparison Study: No prior studies. Performing Technologist: Darlin Coco RDMS, RVT  Examination Guidelines: A complete evaluation includes B-mode imaging, spectral Doppler, color Doppler, and power Doppler as needed of all accessible portions of each vessel. Bilateral testing is considered an integral part of a complete examination. Limited examinations for reoccurring indications may be performed as noted. The reflux portion of the exam is performed with the patient in reverse Trendelenburg.  +---------+---------------+---------+-----------+----------+--------------+ RIGHT    CompressibilityPhasicitySpontaneityPropertiesThrombus Aging +---------+---------------+---------+-----------+----------+--------------+ CFV  Full           No       Yes                                 +---------+---------------+---------+-----------+----------+--------------+ SFJ      Full                                                        +---------+---------------+---------+-----------+----------+--------------+ FV Prox  Full                                                        +---------+---------------+---------+-----------+----------+--------------+ FV Mid   Full                                                        +---------+---------------+---------+-----------+----------+--------------+ FV DistalFull                                                         +---------+---------------+---------+-----------+----------+--------------+ PFV      Full                                                        +---------+---------------+---------+-----------+----------+--------------+ POP      Full           No       Yes                                 +---------+---------------+---------+-----------+----------+--------------+ PTV      Full                                                        +---------+---------------+---------+-----------+----------+--------------+ PERO     Full                                                        +---------+---------------+---------+-----------+----------+--------------+ Gastroc  Full                                                        +---------+---------------+---------+-----------+----------+--------------+   +---------+---------------+---------+-----------+----------+-----------------+  LEFT     CompressibilityPhasicitySpontaneityPropertiesThrombus Aging    +---------+---------------+---------+-----------+----------+-----------------+ CFV      Full           No       Yes                                    +---------+---------------+---------+-----------+----------+-----------------+ SFJ      Full                                                           +---------+---------------+---------+-----------+----------+-----------------+ FV Prox  Full                                                           +---------+---------------+---------+-----------+----------+-----------------+ FV Mid   Full                                                           +---------+---------------+---------+-----------+----------+-----------------+ FV DistalFull                                                           +---------+---------------+---------+-----------+----------+-----------------+ PFV      Full                                                            +---------+---------------+---------+-----------+----------+-----------------+ POP      Full           No       Yes                                    +---------+---------------+---------+-----------+----------+-----------------+ PTV      Full                                                           +---------+---------------+---------+-----------+----------+-----------------+ PERO     Full                                                           +---------+---------------+---------+-----------+----------+-----------------+ Gastroc  None           No  No                   Age Indeterminate +---------+---------------+---------+-----------+----------+-----------------+     Summary: RIGHT: - There is no evidence of deep vein thrombosis in the lower extremity.  - No cystic structure found in the popliteal fossa.  LEFT: - Findings consistent with age indeterminate deep vein thrombosis involving the left gastrocnemius veins.  - No cystic structure found in the popliteal fossa.  *See table(s) above for measurements and observations. Electronically signed by Servando Snare MD on 06/19/2021 at 6:03:56 PM.    Final    ECHOCARDIOGRAM COMPLETE  Result Date: 06/19/2021    ECHOCARDIOGRAM REPORT   Patient Name:   Jerry Saunders Date of Exam: 06/19/2021 Medical Rec #:  [id number]     Height:       74.0 in Accession #:    [id number]    Weight:       270.0 lb Date of Birth:  19-Sep-1970     BSA:          2.469 m Patient Age:    41 years      BP:           104/72 mmHg Patient Gender: M             HR:           107 bpm. Exam Location:  Inpatient Procedure: 2D Echo, Cardiac Doppler, Color Doppler and Intracardiac            Opacification Agent Indications:    Congestive heart failure  History:        Patient has no prior history of Echocardiogram examinations.                 Abnormal ECG, Signs/Symptoms:Shortness of Breath and Cough; Risk                  Factors:Diabetes.  Sonographer:    Joette Catching RCS Referring Phys: [id number] [facility] RATHORE  Sonographer Comments: Image acquisition challenging due to patient body habitus. IMPRESSIONS  1. Left ventricular ejection fraction, by estimation, is <20%. The left ventricle has severely decreased function. The left ventricle demonstrates global hypokinesis. The left ventricular internal cavity size was moderately to severely dilated. There is  mild left ventricular hypertrophy. Left ventricular diastolic parameters were normal.  2. Right ventricular systolic function is mildly reduced. The right ventricular size is not well visualized. There is normal pulmonary artery systolic pressure.  3. Left atrial size was mild to moderately dilated.  4. The mitral valve is normal in structure. Mild mitral valve regurgitation.  5. The aortic valve is tricuspid. Aortic valve regurgitation is not visualized.  6. Aortic no significant ascending aneurysm.  7. The inferior vena cava is dilated in size with >50% respiratory variability, suggesting right atrial pressure of 8 mmHg. Comparison(s): No prior Echocardiogram. FINDINGS  Left Ventricle: Left ventricular ejection fraction, by estimation, is <20%. The left ventricle has severely decreased function. The left ventricle demonstrates global hypokinesis. Definity contrast agent was given IV to delineate the left ventricular endocardial borders. The left ventricular internal cavity size was moderately to severely dilated. There is mild left ventricular hypertrophy. Left ventricular diastolic parameters were normal. Right Ventricle: The right ventricular size is not well visualized. Right vetricular wall thickness was not well visualized. Right ventricular systolic function is mildly reduced. There is normal pulmonary artery systolic pressure. The tricuspid regurgitant velocity is 2.40 m/s, and with an assumed right atrial pressure of  8 mmHg, the estimated right ventricular systolic  pressure is 0000000 mmHg. Left Atrium: Left atrial size was mild to moderately dilated. Right Atrium: Right atrial size was normal in size. Pericardium: There is no evidence of pericardial effusion. Mitral Valve: The mitral valve is normal in structure. Mild mitral valve regurgitation. Tricuspid Valve: The tricuspid valve is normal in structure. Tricuspid valve regurgitation is trivial. Aortic Valve: The aortic valve is tricuspid. Aortic valve regurgitation is not visualized. Aortic valve mean gradient measures 4.0 mmHg. Aortic valve peak gradient measures 5.4 mmHg. Aortic valve area, by VTI measures 2.57 cm. Pulmonic Valve: The pulmonic valve was not well visualized. Pulmonic valve regurgitation is not visualized. Aorta: No significant ascending aneurysm. Venous: The inferior vena cava is dilated in size with greater than 50% respiratory variability, suggesting right atrial pressure of 8 mmHg.  LEFT VENTRICLE PLAX 2D LVIDd:         6.70 cm   Diastology LVIDs:         6.20 cm   LV e' medial:    5.35 cm/s LV PW:         1.20 cm   LV E/e' medial:  18.4 LV IVS:        1.10 cm   LV e' lateral:   14.80 cm/s LVOT diam:     2.10 cm   LV E/e' lateral: 6.7 LV SV:         56 LV SV Index:   23 LVOT Area:     3.46 cm  RIGHT VENTRICLE             IVC RV Basal diam:  5.80 cm     IVC diam: 2.80 cm RV Mid diam:    4.10 cm RV S prime:     15.50 cm/s TAPSE (M-mode): 1.5 cm LEFT ATRIUM              Index        RIGHT ATRIUM           Index LA diam:        5.70 cm  2.31 cm/m   RA Area:     26.20 cm LA Vol (A2C):   149.0 ml 60.35 ml/m  RA Volume:   94.70 ml  38.35 ml/m LA Vol (A4C):   82.0 ml  33.21 ml/m LA Biplane Vol: 116.0 ml 46.98 ml/m  AORTIC VALVE                    PULMONIC VALVE AV Area (Vmax):    3.49 cm     PV Vmax:          0.93 m/s AV Area (Vmean):   3.06 cm     PV Peak grad:     3.5 mmHg AV Area (VTI):     2.57 cm     PR End Diast Vel: 11.56 msec AV Vmax:           116.00 cm/s AV Vmean:          97.100 cm/s AV VTI:             0.218 m AV Peak Grad:      5.4 mmHg AV Mean Grad:      4.0 mmHg LVOT Vmax:         117.00 cm/s LVOT Vmean:        85.900 cm/s LVOT VTI:          0.162 m LVOT/AV VTI ratio: 0.74  AORTA Ao Root diam: 3.60 cm Ao Asc diam:  3.30 cm MITRAL VALVE               TRICUSPID VALVE MV Area (PHT): 7.90 cm    TR Peak grad:   23.0 mmHg MV Decel Time: 96 msec     TR Vmax:        240.00 cm/s MR Peak grad: 58.4 mmHg MR Mean grad: 41.0 mmHg    SHUNTS MR Vmax:      382.00 cm/s  Systemic VTI:  0.16 m MR Vmean:     311.0 cm/s   Systemic Diam: 2.10 cm MV E velocity: 98.50 cm/s MV A velocity: 52.50 cm/s MV E/A ratio:  1.88 Mary Scientist, physiological signed by Phineas Inches Signature Date/Time: 06/19/2021/3:02:05 PM    Final    CT Angio Chest PE W and/or Wo Contrast  Result Date: 06/18/2021 CLINICAL DATA:  Shortness of breath, productive cough. Elevated D-dimer. EXAM: CT ANGIOGRAPHY CHEST WITH CONTRAST TECHNIQUE: Multidetector CT imaging of the chest was performed using the standard protocol during bolus administration of intravenous contrast. Multiplanar CT image reconstructions and MIPs were obtained to evaluate the vascular anatomy. RADIATION DOSE REDUCTION: This exam was performed according to the departmental dose-optimization program which includes automated exposure control, adjustment of the mA and/or kV according to patient size and/or use of iterative reconstruction technique. CONTRAST:  157mL OMNIPAQUE IOHEXOL 350 MG/ML SOLN COMPARISON:  None Available. FINDINGS: Cardiovascular: No filling defects in the pulmonary arteries to suggest pulmonary emboli. Cardiomegaly. Aorta normal caliber. Mediastinum/Nodes: Borderline mediastinal lymph nodes throughout the mediastinum. No axillary or hilar adenopathy. Trachea and esophagus are unremarkable. Thyroid unremarkable. Lungs/Pleura: Small bilateral pleural effusions. Mild ground-glass opacities could reflect mild edema. Upper Abdomen: Scattered low-density lesions in the  liver, likely cysts. No acute abnormality. Musculoskeletal: Chest wall soft tissues are unremarkable. No acute bony abnormality. Review of the MIP images confirms the above findings. IMPRESSION: No evidence of pulmonary embolus. Cardiomegaly, vascular congestion.  Possible early edema. Small bilateral effusions. Scattered borderline sized mediastinal lymph nodes, likely reactive. Electronically Signed   By: Rolm Baptise M.D.   On: 06/18/2021 21:01   DG Chest 2 View  Result Date: 06/18/2021 CLINICAL DATA:  Shortness of breath EXAM: CHEST - 2 VIEW COMPARISON:  None Available. FINDINGS: Gross cardiomegaly. Diffuse bilateral interstitial pulmonary opacity. Osseous structures unremarkable. IMPRESSION: Gross cardiomegaly with diffuse bilateral interstitial pulmonary opacity, likely edema. No focal airspace opacity. Electronically Signed   By: Delanna Ahmadi M.D.   On: 06/18/2021 18:17        Scheduled Meds:  enoxaparin (LOVENOX) injection  40 mg Subcutaneous Q24H   furosemide  20 mg Intravenous BID   insulin aspart  0-5 Units Subcutaneous QHS   insulin aspart  0-9 Units Subcutaneous TID WC   metoprolol tartrate  5 mg Intravenous Once   Continuous Infusions:   LOS: 2 days    Time spent: 35 miutes    Jerry Saunders A Leona Alen, MD [facility]   If 7PM-7AM, please contact night-coverage [email or web address]  06/20/2021, 9:48 AM

## 2021-06-20 NOTE — Progress Notes (Signed)
Mobility Specialist Progress Note    06/20/21 1722  Mobility  Activity Ambulated independently in hallway  Level of Assistance Independent  Assistive Device None  Distance Ambulated (ft) 330 ft  Activity Response Tolerated well  $Mobility charge 1 Mobility   Pre-Mobility: 106 HR During Mobility: 122 HR, 95% SpO2 Post-Mobility: 117 HR  Pt received in bed and agreeable. No complaints on walk. Returned to BR to void.   Massena Nation Mobility Specialist

## 2021-06-20 NOTE — Consult Note (Signed)
CARDIOLOGY CONSULT NOTE  Patient ID: Jerry Saunders MRN: [id number] DOB/AGE: [birth year] 51 y.o.  Admit date: 06/18/2021 Attending physician: Alba Cory, MD Primary Physician:  Aviva Kluver Outpatient Cardiologist: NA Inpatient Cardiologist: Tessa Lerner, DO, [facility]  Reason of consultation: New onset of congestive heart failure Referring physician: Alba Cory, MD  Chief complaint: Shortness of breath  HPI:  Jerry Saunders is a 51 y.o. African American male who presents with a chief complaint of " shortness of breath." His past medical history and cardiovascular risk factors include: History of PVCs.  Patient has not seen a physician over the last 10 years and now presents to the hospital with a chief complaint of shortness of breath.  Patient states his symptoms started approximately 1.5 weeks ago and progressively getting worse.  He has noticed at least 10 pounds of weight gain on gestalt, prompting white sputum production, lower extremity swelling, denies orthopnea or PND.  Since hospitalization he has been on IV diuretics and his shortness of breath is improved by 20% and lower extremity swelling has resolved.  Primary team obtained an echocardiogram yesterday which notes severely reduced LVEF, echo report notes normal diastolic function (on personal review of the echocardiogram he has grade 2 diastolic impairment), elevated left atrial pressures.  Cardiology has been consulted for heart failure management.  Patient is resting in bed comfortably, 1 pillow orthopnea, lower extremity swelling has improved.  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  ALLERGIES: No Known Allergies  PAST MEDICAL HISTORY: Type II DM - newly dx this admission   PAST SURGICAL HISTORY: No prior surgery.   FAMILY HISTORY: No family history of premature CAD, cardiomyopathy, or sudden cardiac death.    SOCIAL HISTORY:   The patient  reports that he has never smoked. He does not have any smokeless tobacco history on file. He reports that he does not drink alcohol and does not use drugs.  MEDICATIONS: Current Outpatient Medications  Medication Instructions   Artificial Tear Ointment (DRY EYES OP) 2 drops, Both Eyes, 2 times daily PRN   ibuprofen (ADVIL) 600 mg, Oral, Every 6 hours PRN    REVIEW OF SYSTEMS: Review of Systems  Constitutional: Positive for fever and weight gain.  Cardiovascular:  Positive for dyspnea on exertion and leg swelling (improved since admission.). Negative for chest pain, orthopnea, palpitations, paroxysmal nocturnal dyspnea and syncope.  Respiratory:  Positive for cough and shortness of breath.   Hematologic/Lymphatic: Negative for bleeding problem.  Neurological:  Negative for dizziness and light-headedness.  All other systems reviewed and are negative.   PHYSICAL EXAM:    06/20/2021    7:57 AM 06/20/2021    5:00 AM 06/20/2021    4:10 AM  Vitals with BMI  Weight  288 lbs 13 oz   BMI  37.06   Systolic 117  109  Diastolic 90  72  Pulse 93  105     Intake/Output Summary (Last 24 hours) at 06/20/2021 1118 Last data filed at 06/20/2021 0926 Gross per 24 hour  Intake 240 ml  Output 3375 ml  Net -3135 ml    Net IO Since Admission: -5,735 mL [06/20/21 1118]  CONSTITUTIONAL: Age-appropriate, hemodynamically stable, well-developed and well-nourished. No acute distress.  SKIN: Skin is warm and dry. No rash noted. No cyanosis. No pallor. No jaundice HEAD: Normocephalic and atraumatic.  EYES: No scleral icterus MOUTH/THROAT: Moist oral membranes.  NECK: JVD present. No thyromegaly  noted. No carotid bruits  CHEST Normal respiratory effort. No intercostal retractions  LUNGS: Clear to auscultation bilaterally.  No stridor. No wheezes. No rales.  CARDIOVASCULAR: Tachycardic, distant heart sounds, no murmurs rubs or gallops appreciated likely secondary to tachycardia.    ABDOMINAL: Obese, soft, nontender, nondistended, positive bowel sounds in all 4 quadrants, no apparent ascites.  EXTREMITIES: No pitting edema, warm to touch, 2+ DP and PT pulses.  HEMATOLOGIC: No significant bruising NEUROLOGIC: Oriented to person, place, and time. Nonfocal. Normal muscle tone.  PSYCHIATRIC: Normal mood and affect. Normal behavior. Cooperative  RADIOLOGY: VAS [place] LOWER EXTREMITY VENOUS (DVT)  Result Date: 06/19/2021  Lower Venous DVT Study Patient Name:  Jerry Saunders  Date of Exam:   06/19/2021 Medical Rec #: [id number]      Accession #:    [id number] Date of Birth: 11/18/1970      Patient Gender: M Patient Age:   51 years Exam Location:  [facility] Procedure:      VAS [place] LOWER EXTREMITY VENOUS (DVT) Referring Phys: Wandra Feinstein RATHORE --------------------------------------------------------------------------------  Indications: Edema, shortness of breath, elevated d-dimer.  Comparison Study: No prior studies. Performing Technologist: Darlin Coco RDMS, RVT  Examination Guidelines: A complete evaluation includes B-mode imaging, spectral Doppler, color Doppler, and power Doppler as needed of all accessible portions of each vessel. Bilateral testing is considered an integral part of a complete examination. Limited examinations for reoccurring indications may be performed as noted. The reflux portion of the exam is performed with the patient in reverse Trendelenburg.  +---------+---------------+---------+-----------+----------+--------------+ RIGHT    CompressibilityPhasicitySpontaneityPropertiesThrombus Aging +---------+---------------+---------+-----------+----------+--------------+ CFV      Full           No       Yes                                 +---------+---------------+---------+-----------+----------+--------------+ SFJ      Full                                                         +---------+---------------+---------+-----------+----------+--------------+ FV Prox  Full                                                        +---------+---------------+---------+-----------+----------+--------------+ FV Mid   Full                                                        +---------+---------------+---------+-----------+----------+--------------+ FV DistalFull                                                        +---------+---------------+---------+-----------+----------+--------------+ PFV      Full                                                        +---------+---------------+---------+-----------+----------+--------------+  POP      Full           No       Yes                                 +---------+---------------+---------+-----------+----------+--------------+ PTV      Full                                                        +---------+---------------+---------+-----------+----------+--------------+ PERO     Full                                                        +---------+---------------+---------+-----------+----------+--------------+ Gastroc  Full                                                        +---------+---------------+---------+-----------+----------+--------------+   +---------+---------------+---------+-----------+----------+-----------------+ LEFT     CompressibilityPhasicitySpontaneityPropertiesThrombus Aging    +---------+---------------+---------+-----------+----------+-----------------+ CFV      Full           No       Yes                                    +---------+---------------+---------+-----------+----------+-----------------+ SFJ      Full                                                           +---------+---------------+---------+-----------+----------+-----------------+ FV Prox  Full                                                            +---------+---------------+---------+-----------+----------+-----------------+ FV Mid   Full                                                           +---------+---------------+---------+-----------+----------+-----------------+ FV DistalFull                                                           +---------+---------------+---------+-----------+----------+-----------------+ PFV      Full                                                           +---------+---------------+---------+-----------+----------+-----------------+  POP      Full           No       Yes                                    +---------+---------------+---------+-----------+----------+-----------------+ PTV      Full                                                           +---------+---------------+---------+-----------+----------+-----------------+ PERO     Full                                                           +---------+---------------+---------+-----------+----------+-----------------+ Gastroc  None           No       No                   Age Indeterminate +---------+---------------+---------+-----------+----------+-----------------+     Summary: RIGHT: - There is no evidence of deep vein thrombosis in the lower extremity.  - No cystic structure found in the popliteal fossa.  LEFT: - Findings consistent with age indeterminate deep vein thrombosis involving the left gastrocnemius veins.  - No cystic structure found in the popliteal fossa.  *See table(s) above for measurements and observations. Electronically signed by Servando Snare MD on 06/19/2021 at 6:03:56 PM.    Final    ECHOCARDIOGRAM COMPLETE  Result Date: 06/19/2021    ECHOCARDIOGRAM REPORT   Patient Name:   Jerry Saunders Date of Exam: 06/19/2021 Medical Rec #:  [id number]     Height:       74.0 in Accession #:    [id number]    Weight:       270.0 lb Date of Birth:  [birth year]     BSA:          2.469 m Patient Age:    41 years       BP:           104/72 mmHg Patient Gender: M             HR:           107 bpm. Exam Location:  Inpatient Procedure: 2D Echo, Cardiac Doppler, Color Doppler and Intracardiac            Opacification Agent Indications:    Congestive heart failure  History:        Patient has no prior history of Echocardiogram examinations.                 Abnormal ECG, Signs/Symptoms:Shortness of Breath and Cough; Risk                 Factors:Diabetes.  Sonographer:    Joette Catching RCS Referring Phys: [id number] [facility] RATHORE  Sonographer Comments: Image acquisition challenging due to patient body habitus. IMPRESSIONS  1. Left ventricular ejection fraction, by estimation, is <20%. The left ventricle has severely decreased function. The left ventricle demonstrates global hypokinesis. The left ventricular internal cavity size was moderately to severely dilated. There  is  mild left ventricular hypertrophy. Left ventricular diastolic parameters were normal.  2. Right ventricular systolic function is mildly reduced. The right ventricular size is not well visualized. There is normal pulmonary artery systolic pressure.  3. Left atrial size was mild to moderately dilated.  4. The mitral valve is normal in structure. Mild mitral valve regurgitation.  5. The aortic valve is tricuspid. Aortic valve regurgitation is not visualized.  6. Aortic no significant ascending aneurysm.  7. The inferior vena cava is dilated in size with >50% respiratory variability, suggesting right atrial pressure of 8 mmHg. Comparison(s): No prior Echocardiogram. FINDINGS  Left Ventricle: Left ventricular ejection fraction, by estimation, is <20%. The left ventricle has severely decreased function. The left ventricle demonstrates global hypokinesis. Definity contrast agent was given IV to delineate the left ventricular endocardial borders. The left ventricular internal cavity size was moderately to severely dilated. There is mild left ventricular hypertrophy.  Left ventricular diastolic parameters were normal. Right Ventricle: The right ventricular size is not well visualized. Right vetricular wall thickness was not well visualized. Right ventricular systolic function is mildly reduced. There is normal pulmonary artery systolic pressure. The tricuspid regurgitant velocity is 2.40 m/s, and with an assumed right atrial pressure of 8 mmHg, the estimated right ventricular systolic pressure is 0000000 mmHg. Left Atrium: Left atrial size was mild to moderately dilated. Right Atrium: Right atrial size was normal in size. Pericardium: There is no evidence of pericardial effusion. Mitral Valve: The mitral valve is normal in structure. Mild mitral valve regurgitation. Tricuspid Valve: The tricuspid valve is normal in structure. Tricuspid valve regurgitation is trivial. Aortic Valve: The aortic valve is tricuspid. Aortic valve regurgitation is not visualized. Aortic valve mean gradient measures 4.0 mmHg. Aortic valve peak gradient measures 5.4 mmHg. Aortic valve area, by VTI measures 2.57 cm. Pulmonic Valve: The pulmonic valve was not well visualized. Pulmonic valve regurgitation is not visualized. Aorta: No significant ascending aneurysm. Venous: The inferior vena cava is dilated in size with greater than 50% respiratory variability, suggesting right atrial pressure of 8 mmHg.  LEFT VENTRICLE PLAX 2D LVIDd:         6.70 cm   Diastology LVIDs:         6.20 cm   LV e' medial:    5.35 cm/s LV PW:         1.20 cm   LV E/e' medial:  18.4 LV IVS:        1.10 cm   LV e' lateral:   14.80 cm/s LVOT diam:     2.10 cm   LV E/e' lateral: 6.7 LV SV:         56 LV SV Index:   23 LVOT Area:     3.46 cm  RIGHT VENTRICLE             IVC RV Basal diam:  5.80 cm     IVC diam: 2.80 cm RV Mid diam:    4.10 cm RV S prime:     15.50 cm/s TAPSE (M-mode): 1.5 cm LEFT ATRIUM              Index        RIGHT ATRIUM           Index LA diam:        5.70 cm  2.31 cm/m   RA Area:     26.20 cm LA Vol (A2C):    149.0 ml 60.35 ml/m  RA Volume:   94.70  ml  38.35 ml/m LA Vol (A4C):   82.0 ml  33.21 ml/m LA Biplane Vol: 116.0 ml 46.98 ml/m  AORTIC VALVE                    PULMONIC VALVE AV Area (Vmax):    3.49 cm     PV Vmax:          0.93 m/s AV Area (Vmean):   3.06 cm     PV Peak grad:     3.5 mmHg AV Area (VTI):     2.57 cm     PR End Diast Vel: 11.56 msec AV Vmax:           116.00 cm/s AV Vmean:          97.100 cm/s AV VTI:            0.218 m AV Peak Grad:      5.4 mmHg AV Mean Grad:      4.0 mmHg LVOT Vmax:         117.00 cm/s LVOT Vmean:        85.900 cm/s LVOT VTI:          0.162 m LVOT/AV VTI ratio: 0.74  AORTA Ao Root diam: 3.60 cm Ao Asc diam:  3.30 cm MITRAL VALVE               TRICUSPID VALVE MV Area (PHT): 7.90 cm    TR Peak grad:   23.0 mmHg MV Decel Time: 96 msec     TR Vmax:        240.00 cm/s MR Peak grad: 58.4 mmHg MR Mean grad: 41.0 mmHg    SHUNTS MR Vmax:      382.00 cm/s  Systemic VTI:  0.16 m MR Vmean:     311.0 cm/s   Systemic Diam: 2.10 cm MV E velocity: 98.50 cm/s MV A velocity: 52.50 cm/s MV E/A ratio:  1.88 Jerry Saunders Jerry Saunders, Jerry Saunders signed by Phineas Inches Signature Date/Time: 06/19/2021/3:02:05 PM    Final    CT Angio Chest PE W and/or Wo Contrast  Result Date: 06/18/2021 CLINICAL DATA:  Shortness of breath, productive cough. Elevated D-dimer. EXAM: CT ANGIOGRAPHY CHEST WITH CONTRAST TECHNIQUE: Multidetector CT imaging of the chest was performed using the standard protocol during bolus administration of intravenous contrast. Multiplanar CT image reconstructions and MIPs were obtained to evaluate the vascular anatomy. RADIATION DOSE REDUCTION: This exam was performed according to the departmental dose-optimization program which includes automated exposure control, adjustment of the mA and/or kV according to patient size and/or use of iterative reconstruction technique. CONTRAST:  134mL OMNIPAQUE IOHEXOL 350 MG/ML SOLN COMPARISON:  None Available. FINDINGS: Cardiovascular: No filling defects  in the pulmonary arteries to suggest pulmonary emboli. Cardiomegaly. Aorta normal caliber. Mediastinum/Nodes: Borderline mediastinal lymph nodes throughout the mediastinum. No axillary or hilar adenopathy. Trachea and esophagus are unremarkable. Thyroid unremarkable. Lungs/Pleura: Small bilateral pleural effusions. Mild ground-glass opacities could reflect mild edema. Upper Abdomen: Scattered low-density lesions in the liver, likely cysts. No acute abnormality. Musculoskeletal: Chest wall soft tissues are unremarkable. No acute bony abnormality. Review of the MIP images confirms the above findings. IMPRESSION: No evidence of pulmonary embolus. Cardiomegaly, vascular congestion.  Possible early edema. Small bilateral effusions. Scattered borderline sized mediastinal lymph nodes, likely reactive. Electronically Signed   By: Rolm Baptise M.D.   On: 06/18/2021 21:01   DG Chest 2 View  Result Date: 06/18/2021 CLINICAL DATA:  Shortness of breath EXAM: CHEST - 2  VIEW COMPARISON:  None Available. FINDINGS: Gross cardiomegaly. Diffuse bilateral interstitial pulmonary opacity. Osseous structures unremarkable. IMPRESSION: Gross cardiomegaly with diffuse bilateral interstitial pulmonary opacity, likely edema. No focal airspace opacity. Electronically Signed   By: Delanna Ahmadi M.D.   On: 06/18/2021 18:17    LABORATORY DATA: Lab Results  Component Value Date   WBC 8.1 06/20/2021   HGB 12.2 (L) 06/20/2021   HCT 38.7 (L) 06/20/2021   MCV 87.4 06/20/2021   PLT 311 06/20/2021    Recent Labs  Lab 06/19/21 0426 06/20/21 0407  NA 139 141  K 4.1 3.6  CL 103 103  CO2 28 29  BUN 14 14  CREATININE 0.93 0.78  CALCIUM 8.4* 8.2*  PROT 6.5  --   BILITOT 1.0  --   ALKPHOS 78  --   ALT 48*  --   AST 21  --   GLUCOSE 192* 138*    Lipid Panel  No results found for: "CHOL", "HDL", "LDLCALC", "LDLDIRECT", "TRIG", "CHOLHDL"  BNP (last 3 results) Recent Labs    06/18/21 1736  BNP 1,295.7*    HEMOGLOBIN  A1C Lab Results  Component Value Date   HGBA1C 7.8 (H) 06/19/2021   MPG 177.16 06/19/2021    Cardiac Panel (last 3 results) Recent Labs    06/18/21 1735 06/18/21 2016  TROPONINIHS 45* 49*     TSH No results for input(s): "TSH" in the last 8760 hours.   CARDIAC DATABASE: EKG: 06/18/2021: Sinus tachycardia, 119 bpm, biatrial enlargement, right axis deviation, incomplete right bundle branch block.  06/19/2021: Sinus tachycardia 158 bpm, poor R wave progression, ST-T changes in the anterior lateral leads cannot rule out ischemia versus rate related changes, prolonged QT interval.  Echocardiogram: 06/19/2021: LVEF <20%, severely reduced systolic function, global hypokinesis, left ventricular size is dilated, right ventricular size is dilated, systolic function reduced, biatrial enlargement, mild MR, IVC dilated, estimated RAP 15 mmHg, Definity contrast was utilized LV thrombus not present (This is a personal read, it differs from the final report).  Stress Testing:  None  Heart Catheterization: Pending  Bilateral lower extremity venous duplex: 06/19/2021 RIGHT:  - There is no evidence of deep vein thrombosis in the lower extremity.     - No cystic structure found in the popliteal fossa.     LEFT:  - Findings consistent with age indeterminate deep vein thrombosis  involving the left gastrocnemius veins.     - No cystic structure found in the popliteal fossa.   IMPRESSION & RECOMMENDATIONS: Jerry Saunders is a 51 y.o. African-American male whose past medical history and cardiovascular risk factors include: Premature ventricular contractions, newly discovered type II diabetic, newly discovered systolic and diastolic heart failure, obesity.  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 Net IO Since Admission: -5,735 mL [06/20/21 1118] Echo 06/19/2021: LVEF [id number], diastolic dysfunction, right ventricular size and function reduced. Discussed ischemic work-up and given the newly diagnosed cardiomyopathy, newly discovered diabetes mellitus type 2, recommend left and right heart catheterization.  Risks, benefits, and alternatives discussed with both the patient and his wife (  over the phone).  This shared decision is to proceed with right and left angiography with possible intervention. Currently not on GDMT. We will start Farxiga 10 mg p.o. daily, continue IV diuresis, strict I's and O's and daily weights.  Would like to focus on diuresing first and then will uptitrate GDMT. Start Corlanor 5 mg p.o. twice daily Check urine drug screen, respiratory panel, TSH Continue telemetry  The procedure of left and right heart catheterization with possible intervention was explained to the patient and his wife over the phone in detail.  The indication, alternatives, risks and benefits were reviewed.  Complications include but not limited to bleeding, infection, vascular injury, stroke, myocardial infarction, arrhythmia (requiring medical or cardiopulmonary resuscitation), kidney injury (requiring short-term or long-term hemodialysis), radiation-related injury in the case of prolonged fluoroscopy use, emergent cardiac surgery, temporary or permanent pacemaker, and death. The patient and wife voices understanding and provides verbal feedback and their questions and concerns are addressed to their satisfaction and patient wishes to proceed with right and left  coronary angiography with possible PCI.   Dilated cardiomyopathy, etiology unspecified see above We will consider cardiac MRI prior to discharge Plan for diuresis and symptom management, up titration of GDMT prior to discharge  Diabetes mellitus type 2: Newly discovered this admission. Check fasting lipid profile and direct LDL tomorrow We will start statin therapy after lipids checked.  Left gastrocnemius vein DVT, indeterminate:  D-dimer elevated. CT PE protocol negative for PE, per report Management per primary team  Management of other chronic comorbid conditions per primary team.  Total time spent 85 minutes discussing the management of newly discovered systolic and diastolic heart failure, obtaining a consent for left and right heart catheterization with the patient and another phone call with his wife, discussion of plan of care with both nursing staff and attending physician, medication changes as noted above, reviewing already performed work-up including labs, EKG, independent review of echocardiogram as noted above.  Further recommendations to follow.  Patient's questions and concerns were addressed to his satisfaction. He voices understanding of the instructions provided during this encounter.   This note was created using a voice recognition software as a result there may be grammatical errors inadvertently enclosed that do not reflect the nature of this encounter. Every attempt is made to correct such errors.  Mechele [facility]  Pager: [phone] Office: [phone] 06/20/2021, 11:18 AM

## 2021-06-20 NOTE — H&P (View-Only) (Signed)
CARDIOLOGY CONSULT NOTE  Patient ID: Jerry Saunders MRN: [id number] DOB/AGE: 08/11/1970 50 y.o.  Admit date: 06/18/2021 Attending physician: Regalado, Belkys A, MD Primary Physician:  Pcp, No Outpatient Cardiologist: NA Inpatient Cardiologist: Avriana Joo, DO, FACC  Reason of consultation: New onset of congestive heart failure Referring physician: Regalado, Belkys A, MD  Chief complaint: Shortness of breath  HPI:  Jerry Saunders is a 50 y.o. African American male who presents with a chief complaint of " shortness of breath." His past medical history and cardiovascular risk factors include: History of PVCs.  Patient has not seen a physician over the last 10 years and now presents to the hospital with a chief complaint of shortness of breath.  Patient states his symptoms started approximately 1.5 weeks ago and progressively getting worse.  He has noticed at least 10 pounds of weight gain on gestalt, prompting white sputum production, lower extremity swelling, denies orthopnea or PND.  Since hospitalization he has been on IV diuretics and his shortness of breath is improved by 20% and lower extremity swelling has resolved.  Primary team obtained an echocardiogram yesterday which notes severely reduced LVEF, echo report notes normal diastolic function (on personal review of the echocardiogram he has grade 2 diastolic impairment), elevated left atrial pressures.  Cardiology has been consulted for heart failure management.  Patient is resting in bed comfortably, 1 pillow orthopnea, lower extremity swelling has improved.  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  ALLERGIES: No Known Allergies  PAST MEDICAL HISTORY: Type II DM - newly dx this admission   PAST SURGICAL HISTORY: No prior surgery.   FAMILY HISTORY: No family history of premature CAD, cardiomyopathy, or sudden cardiac death.    SOCIAL HISTORY:   The patient  reports that he has never smoked. He does not have any smokeless tobacco history on file. He reports that he does not drink alcohol and does not use drugs.  MEDICATIONS: Current Outpatient Medications  Medication Instructions   Artificial Tear Ointment (DRY EYES OP) 2 drops, Both Eyes, 2 times daily PRN   ibuprofen (ADVIL) 600 mg, Oral, Every 6 hours PRN    REVIEW OF SYSTEMS: Review of Systems  Constitutional: Positive for fever and weight gain.  Cardiovascular:  Positive for dyspnea on exertion and leg swelling (improved since admission.). Negative for chest pain, orthopnea, palpitations, paroxysmal nocturnal dyspnea and syncope.  Respiratory:  Positive for cough and shortness of breath.   Hematologic/Lymphatic: Negative for bleeding problem.  Neurological:  Negative for dizziness and light-headedness.  All other systems reviewed and are negative.   PHYSICAL EXAM:    06/20/2021    7:57 AM 06/20/2021    5:00 AM 06/20/2021    4:10 AM  Vitals with BMI  Weight  288 lbs 13 oz   BMI  37.06   Systolic 117  109  Diastolic 90  72  Pulse 93  105     Intake/Output Summary (Last 24 hours) at 06/20/2021 1118 Last data filed at 06/20/2021 0926 Gross per 24 hour  Intake 240 ml  Output 3375 ml  Net -3135 ml    Net IO Since Admission: -5,735 mL [06/20/21 1118]  CONSTITUTIONAL: Age-appropriate, hemodynamically stable, well-developed and well-nourished. No acute distress.  SKIN: Skin is warm and dry. No rash noted. No cyanosis. No pallor. No jaundice HEAD: Normocephalic and atraumatic.  EYES: No scleral icterus MOUTH/THROAT: Moist oral membranes.  NECK: JVD present. No thyromegaly   noted. No carotid bruits  CHEST Normal respiratory effort. No intercostal retractions  LUNGS: Clear to auscultation bilaterally.  No stridor. No wheezes. No rales.  CARDIOVASCULAR: Tachycardic, distant heart sounds, no murmurs rubs or gallops appreciated likely secondary to tachycardia.    ABDOMINAL: Obese, soft, nontender, nondistended, positive bowel sounds in all 4 quadrants, no apparent ascites.  EXTREMITIES: No pitting edema, warm to touch, 2+ DP and PT pulses.  HEMATOLOGIC: No significant bruising NEUROLOGIC: Oriented to person, place, and time. Nonfocal. Normal muscle tone.  PSYCHIATRIC: Normal mood and affect. Normal behavior. Cooperative  RADIOLOGY: VAS [place] LOWER EXTREMITY VENOUS (DVT)  Result Date: 06/19/2021  Lower Venous DVT Study Patient Name:  Jerry Saunders  Date of Exam:   06/19/2021 Medical Rec #: [id number]      Accession #:    [id number] Date of Birth: 11/18/1970      Patient Gender: M Patient Age:   19 years Exam Location:  [facility] Procedure:      VAS [place] LOWER EXTREMITY VENOUS (DVT) Referring Phys: Wandra Feinstein RATHORE --------------------------------------------------------------------------------  Indications: Edema, shortness of breath, elevated d-dimer.  Comparison Study: No prior studies. Performing Technologist: Darlin Coco RDMS, RVT  Examination Guidelines: A complete evaluation includes B-mode imaging, spectral Doppler, color Doppler, and power Doppler as needed of all accessible portions of each vessel. Bilateral testing is considered an integral part of a complete examination. Limited examinations for reoccurring indications may be performed as noted. The reflux portion of the exam is performed with the patient in reverse Trendelenburg.  +---------+---------------+---------+-----------+----------+--------------+ RIGHT    CompressibilityPhasicitySpontaneityPropertiesThrombus Aging +---------+---------------+---------+-----------+----------+--------------+ CFV      Full           No       Yes                                 +---------+---------------+---------+-----------+----------+--------------+ SFJ      Full                                                         +---------+---------------+---------+-----------+----------+--------------+ FV Prox  Full                                                        +---------+---------------+---------+-----------+----------+--------------+ FV Mid   Full                                                        +---------+---------------+---------+-----------+----------+--------------+ FV DistalFull                                                        +---------+---------------+---------+-----------+----------+--------------+ PFV      Full                                                        +---------+---------------+---------+-----------+----------+--------------+  POP      Full           No       Yes                                 +---------+---------------+---------+-----------+----------+--------------+ PTV      Full                                                        +---------+---------------+---------+-----------+----------+--------------+ PERO     Full                                                        +---------+---------------+---------+-----------+----------+--------------+ Gastroc  Full                                                        +---------+---------------+---------+-----------+----------+--------------+   +---------+---------------+---------+-----------+----------+-----------------+ LEFT     CompressibilityPhasicitySpontaneityPropertiesThrombus Aging    +---------+---------------+---------+-----------+----------+-----------------+ CFV      Full           No       Yes                                    +---------+---------------+---------+-----------+----------+-----------------+ SFJ      Full                                                           +---------+---------------+---------+-----------+----------+-----------------+ FV Prox  Full                                                            +---------+---------------+---------+-----------+----------+-----------------+ FV Mid   Full                                                           +---------+---------------+---------+-----------+----------+-----------------+ FV DistalFull                                                           +---------+---------------+---------+-----------+----------+-----------------+ PFV      Full                                                           +---------+---------------+---------+-----------+----------+-----------------+  POP      Full           No       Yes                                    +---------+---------------+---------+-----------+----------+-----------------+ PTV      Full                                                           +---------+---------------+---------+-----------+----------+-----------------+ PERO     Full                                                           +---------+---------------+---------+-----------+----------+-----------------+ Gastroc  None           No       No                   Age Indeterminate +---------+---------------+---------+-----------+----------+-----------------+     Summary: RIGHT: - There is no evidence of deep vein thrombosis in the lower extremity.  - No cystic structure found in the popliteal fossa.  LEFT: - Findings consistent with age indeterminate deep vein thrombosis involving the left gastrocnemius veins.  - No cystic structure found in the popliteal fossa.  *See table(s) above for measurements and observations. Electronically signed by Servando Snare MD on 06/19/2021 at 6:03:56 PM.    Final    ECHOCARDIOGRAM COMPLETE  Result Date: 06/19/2021    ECHOCARDIOGRAM REPORT   Patient Name:   Jerry Saunders Date of Exam: 06/19/2021 Medical Rec #:  [id number]     Height:       74.0 in Accession #:    [id number]    Weight:       270.0 lb Date of Birth:  [birth year]     BSA:          2.469 m Patient Age:    41 years       BP:           104/72 mmHg Patient Gender: M             HR:           107 bpm. Exam Location:  Inpatient Procedure: 2D Echo, Cardiac Doppler, Color Doppler and Intracardiac            Opacification Agent Indications:    Congestive heart failure  History:        Patient has no prior history of Echocardiogram examinations.                 Abnormal ECG, Signs/Symptoms:Shortness of Breath and Cough; Risk                 Factors:Diabetes.  Sonographer:    Joette Catching RCS Referring Phys: [id number] [facility] RATHORE  Sonographer Comments: Image acquisition challenging due to patient body habitus. IMPRESSIONS  1. Left ventricular ejection fraction, by estimation, is <20%. The left ventricle has severely decreased function. The left ventricle demonstrates global hypokinesis. The left ventricular internal cavity size was moderately to severely dilated. There  is  mild left ventricular hypertrophy. Left ventricular diastolic parameters were normal.  2. Right ventricular systolic function is mildly reduced. The right ventricular size is not well visualized. There is normal pulmonary artery systolic pressure.  3. Left atrial size was mild to moderately dilated.  4. The mitral valve is normal in structure. Mild mitral valve regurgitation.  5. The aortic valve is tricuspid. Aortic valve regurgitation is not visualized.  6. Aortic no significant ascending aneurysm.  7. The inferior vena cava is dilated in size with >50% respiratory variability, suggesting right atrial pressure of 8 mmHg. Comparison(s): No prior Echocardiogram. FINDINGS  Left Ventricle: Left ventricular ejection fraction, by estimation, is <20%. The left ventricle has severely decreased function. The left ventricle demonstrates global hypokinesis. Definity contrast agent was given IV to delineate the left ventricular endocardial borders. The left ventricular internal cavity size was moderately to severely dilated. There is mild left ventricular hypertrophy.  Left ventricular diastolic parameters were normal. Right Ventricle: The right ventricular size is not well visualized. Right vetricular wall thickness was not well visualized. Right ventricular systolic function is mildly reduced. There is normal pulmonary artery systolic pressure. The tricuspid regurgitant velocity is 2.40 m/s, and with an assumed right atrial pressure of 8 mmHg, the estimated right ventricular systolic pressure is 0000000 mmHg. Left Atrium: Left atrial size was mild to moderately dilated. Right Atrium: Right atrial size was normal in size. Pericardium: There is no evidence of pericardial effusion. Mitral Valve: The mitral valve is normal in structure. Mild mitral valve regurgitation. Tricuspid Valve: The tricuspid valve is normal in structure. Tricuspid valve regurgitation is trivial. Aortic Valve: The aortic valve is tricuspid. Aortic valve regurgitation is not visualized. Aortic valve mean gradient measures 4.0 mmHg. Aortic valve peak gradient measures 5.4 mmHg. Aortic valve area, by VTI measures 2.57 cm. Pulmonic Valve: The pulmonic valve was not well visualized. Pulmonic valve regurgitation is not visualized. Aorta: No significant ascending aneurysm. Venous: The inferior vena cava is dilated in size with greater than 50% respiratory variability, suggesting right atrial pressure of 8 mmHg.  LEFT VENTRICLE PLAX 2D LVIDd:         6.70 cm   Diastology LVIDs:         6.20 cm   LV e' medial:    5.35 cm/s LV PW:         1.20 cm   LV E/e' medial:  18.4 LV IVS:        1.10 cm   LV e' lateral:   14.80 cm/s LVOT diam:     2.10 cm   LV E/e' lateral: 6.7 LV SV:         56 LV SV Index:   23 LVOT Area:     3.46 cm  RIGHT VENTRICLE             IVC RV Basal diam:  5.80 cm     IVC diam: 2.80 cm RV Mid diam:    4.10 cm RV S prime:     15.50 cm/s TAPSE (M-mode): 1.5 cm LEFT ATRIUM              Index        RIGHT ATRIUM           Index LA diam:        5.70 cm  2.31 cm/m   RA Area:     26.20 cm LA Vol (A2C):    149.0 ml 60.35 ml/m  RA Volume:   94.70  ml  38.35 ml/m LA Vol (A4C):   82.0 ml  33.21 ml/m LA Biplane Vol: 116.0 ml 46.98 ml/m  AORTIC VALVE                    PULMONIC VALVE AV Area (Vmax):    3.49 cm     PV Vmax:          0.93 m/s AV Area (Vmean):   3.06 cm     PV Peak grad:     3.5 mmHg AV Area (VTI):     2.57 cm     PR End Diast Vel: 11.56 msec AV Vmax:           116.00 cm/s AV Vmean:          97.100 cm/s AV VTI:            0.218 m AV Peak Grad:      5.4 mmHg AV Mean Grad:      4.0 mmHg LVOT Vmax:         117.00 cm/s LVOT Vmean:        85.900 cm/s LVOT VTI:          0.162 m LVOT/AV VTI ratio: 0.74  AORTA Ao Root diam: 3.60 cm Ao Asc diam:  3.30 cm MITRAL VALVE               TRICUSPID VALVE MV Area (PHT): 7.90 cm    TR Peak grad:   23.0 mmHg MV Decel Time: 96 msec     TR Vmax:        240.00 cm/s MR Peak grad: 58.4 mmHg MR Mean grad: 41.0 mmHg    SHUNTS MR Vmax:      382.00 cm/s  Systemic VTI:  0.16 m MR Vmean:     311.0 cm/s   Systemic Diam: 2.10 cm MV E velocity: 98.50 cm/s MV A velocity: 52.50 cm/s MV E/A ratio:  1.88 Mary Scientist, physiological signed by Phineas Inches Signature Date/Time: 06/19/2021/3:02:05 PM    Final    CT Angio Chest PE W and/or Wo Contrast  Result Date: 06/18/2021 CLINICAL DATA:  Shortness of breath, productive cough. Elevated D-dimer. EXAM: CT ANGIOGRAPHY CHEST WITH CONTRAST TECHNIQUE: Multidetector CT imaging of the chest was performed using the standard protocol during bolus administration of intravenous contrast. Multiplanar CT image reconstructions and MIPs were obtained to evaluate the vascular anatomy. RADIATION DOSE REDUCTION: This exam was performed according to the departmental dose-optimization program which includes automated exposure control, adjustment of the mA and/or kV according to patient size and/or use of iterative reconstruction technique. CONTRAST:  134mL OMNIPAQUE IOHEXOL 350 MG/ML SOLN COMPARISON:  None Available. FINDINGS: Cardiovascular: No filling defects  in the pulmonary arteries to suggest pulmonary emboli. Cardiomegaly. Aorta normal caliber. Mediastinum/Nodes: Borderline mediastinal lymph nodes throughout the mediastinum. No axillary or hilar adenopathy. Trachea and esophagus are unremarkable. Thyroid unremarkable. Lungs/Pleura: Small bilateral pleural effusions. Mild ground-glass opacities could reflect mild edema. Upper Abdomen: Scattered low-density lesions in the liver, likely cysts. No acute abnormality. Musculoskeletal: Chest wall soft tissues are unremarkable. No acute bony abnormality. Review of the MIP images confirms the above findings. IMPRESSION: No evidence of pulmonary embolus. Cardiomegaly, vascular congestion.  Possible early edema. Small bilateral effusions. Scattered borderline sized mediastinal lymph nodes, likely reactive. Electronically Signed   By: Rolm Baptise M.D.   On: 06/18/2021 21:01   DG Chest 2 View  Result Date: 06/18/2021 CLINICAL DATA:  Shortness of breath EXAM: CHEST - 2  VIEW COMPARISON:  None Available. FINDINGS: Gross cardiomegaly. Diffuse bilateral interstitial pulmonary opacity. Osseous structures unremarkable. IMPRESSION: Gross cardiomegaly with diffuse bilateral interstitial pulmonary opacity, likely edema. No focal airspace opacity. Electronically Signed   By: Delanna Ahmadi M.D.   On: 06/18/2021 18:17    LABORATORY DATA: Lab Results  Component Value Date   WBC 8.1 06/20/2021   HGB 12.2 (L) 06/20/2021   HCT 38.7 (L) 06/20/2021   MCV 87.4 06/20/2021   PLT 311 06/20/2021    Recent Labs  Lab 06/19/21 0426 06/20/21 0407  NA 139 141  K 4.1 3.6  CL 103 103  CO2 28 29  BUN 14 14  CREATININE 0.93 0.78  CALCIUM 8.4* 8.2*  PROT 6.5  --   BILITOT 1.0  --   ALKPHOS 78  --   ALT 48*  --   AST 21  --   GLUCOSE 192* 138*    Lipid Panel  No results found for: "CHOL", "HDL", "LDLCALC", "LDLDIRECT", "TRIG", "CHOLHDL"  BNP (last 3 results) Recent Labs    06/18/21 1736  BNP 1,295.7*    HEMOGLOBIN  A1C Lab Results  Component Value Date   HGBA1C 7.8 (H) 06/19/2021   MPG 177.16 06/19/2021    Cardiac Panel (last 3 results) Recent Labs    06/18/21 1735 06/18/21 2016  TROPONINIHS 45* 49*     TSH No results for input(s): "TSH" in the last 8760 hours.   CARDIAC DATABASE: EKG: 06/18/2021: Sinus tachycardia, 119 bpm, biatrial enlargement, right axis deviation, incomplete right bundle branch block.  06/19/2021: Sinus tachycardia 158 bpm, poor R wave progression, ST-T changes in the anterior lateral leads cannot rule out ischemia versus rate related changes, prolonged QT interval.  Echocardiogram: 06/19/2021: LVEF <20%, severely reduced systolic function, global hypokinesis, left ventricular size is dilated, right ventricular size is dilated, systolic function reduced, biatrial enlargement, mild MR, IVC dilated, estimated RAP 15 mmHg, Definity contrast was utilized LV thrombus not present (This is a personal read, it differs from the final report).  Stress Testing:  None  Heart Catheterization: Pending  Bilateral lower extremity venous duplex: 06/19/2021 RIGHT:  - There is no evidence of deep vein thrombosis in the lower extremity.     - No cystic structure found in the popliteal fossa.     LEFT:  - Findings consistent with age indeterminate deep vein thrombosis  involving the left gastrocnemius veins.     - No cystic structure found in the popliteal fossa.   IMPRESSION & RECOMMENDATIONS: Jerry Saunders is a 51 y.o. African-American male whose past medical history and cardiovascular risk factors include: Premature ventricular contractions, newly discovered type II diabetic, newly discovered systolic and diastolic heart failure, obesity.  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 Net IO Since Admission: -5,735 mL [06/20/21 1118] Echo 06/19/2021: LVEF [id number], diastolic dysfunction, right ventricular size and function reduced. Discussed ischemic work-up and given the newly diagnosed cardiomyopathy, newly discovered diabetes mellitus type 2, recommend left and right heart catheterization.  Risks, benefits, and alternatives discussed with both the patient and his wife (  over the phone).  This shared decision is to proceed with right and left angiography with possible intervention. Currently not on GDMT. We will start Farxiga 10 mg p.o. daily, continue IV diuresis, strict I's and O's and daily weights.  Would like to focus on diuresing first and then will uptitrate GDMT. Start Corlanor 5 mg p.o. twice daily Check urine drug screen, respiratory panel, TSH Continue telemetry  The procedure of left and right heart catheterization with possible intervention was explained to the patient and his wife over the phone in detail.  The indication, alternatives, risks and benefits were reviewed.  Complications include but not limited to bleeding, infection, vascular injury, stroke, myocardial infarction, arrhythmia (requiring medical or cardiopulmonary resuscitation), kidney injury (requiring short-term or long-term hemodialysis), radiation-related injury in the case of prolonged fluoroscopy use, emergent cardiac surgery, temporary or permanent pacemaker, and death. The patient and wife voices understanding and provides verbal feedback and their questions and concerns are addressed to their satisfaction and patient wishes to proceed with right and left  coronary angiography with possible PCI.   Dilated cardiomyopathy, etiology unspecified see above We will consider cardiac MRI prior to discharge Plan for diuresis and symptom management, up titration of GDMT prior to discharge  Diabetes mellitus type 2: Newly discovered this admission. Check fasting lipid profile and direct LDL tomorrow We will start statin therapy after lipids checked.  Left gastrocnemius vein DVT, indeterminate:  D-dimer elevated. CT PE protocol negative for PE, per report Management per primary team  Management of other chronic comorbid conditions per primary team.  Total time spent 85 minutes discussing the management of newly discovered systolic and diastolic heart failure, obtaining a consent for left and right heart catheterization with the patient and another phone call with his wife, discussion of plan of care with both nursing staff and attending physician, medication changes as noted above, reviewing already performed work-up including labs, EKG, independent review of echocardiogram as noted above.  Further recommendations to follow.  Patient's questions and concerns were addressed to his satisfaction. He voices understanding of the instructions provided during this encounter.   This note was created using a voice recognition software as a result there may be grammatical errors inadvertently enclosed that do not reflect the nature of this encounter. Every attempt is made to correct such errors.  Mechele [facility]  Pager: [phone] Office: [phone] 06/20/2021, 11:18 AM

## 2021-06-20 NOTE — Progress Notes (Signed)
Patient via bed to cath lab 

## 2021-06-20 NOTE — Progress Notes (Signed)
Heart Failure Navigator Progress Note  Assessed for Heart & Vascular TOC clinic readiness.  Patient is a [place] [facility] patient, No TOC.   Navigator available for reassessment of patient.   Rhae Hammock, BSN, Scientist, clinical (histocompatibility and immunogenetics) Only

## 2021-06-20 NOTE — TOC Initial Note (Addendum)
Transition of Care [facility]) - Initial/Assessment Note    Patient Details  Name: Jerry Saunders MRN: [id number] Date of Birth: February 09, 1970  Transition of Care [facility]) CM/SW Contact:    Angelita Ingles, RN Phone Number:[phone]  06/20/2021, 4:04 PM  Clinical Narrative:                 TOC consulted to assist with determining if patient has insurance. CM spoke with wife who states that patient does not currently have medical insurance. Patient has recently started a new job and does not have benefits. TOC will follow for needs.         Patient Goals and CMS Choice        Expected Discharge Plan and Services                                                Prior Living Arrangements/Services                       Activities of Daily Living Home Assistive Devices/Equipment: CBG Meter, Eyeglasses ADL Screening (condition at time of admission) Patient's cognitive ability adequate to safely complete daily activities?: Yes Is the patient deaf or have difficulty hearing?: No Does the patient have difficulty seeing, even when wearing glasses/contacts?: No Does the patient have difficulty concentrating, remembering, or making decisions?: No Patient able to express need for assistance with ADLs?: Yes Does the patient have difficulty dressing or bathing?: No Independently performs ADLs?: Yes (appropriate for developmental age) Does the patient have difficulty walking or climbing stairs?: No Weakness of Legs: None Weakness of Arms/Hands: None  Permission Sought/Granted                  Emotional Assessment              Admission diagnosis:  CHF (congestive heart failure) ([place]) [I50.9] Dyspnea, unspecified type [R06.00] Patient Active Problem List   Diagnosis Date Noted   DVT (deep venous thrombosis) ([place]) 06/20/2021   Acute combined systolic and diastolic congestive heart failure ([place]) 06/20/2021   CHF (congestive heart failure) ([place]) 06/18/2021    Acute respiratory failure with hypoxia ([place]) 06/18/2021   Elevated d-dimer 06/18/2021   Type 2 diabetes mellitus ([place]) 06/18/2021   Normocytic anemia 06/18/2021   PCP:  Merryl Hacker No Pharmacy:   [facility] [address] ([address]), [place] [phone] [place] ([place]) [place] [address] Phone: [phone] Fax: [phone]  [facility] [address] [place] [address] Phone: [phone] Fax: [phone]     Social Determinants of Health (SDOH) Interventions    Readmission Risk Interventions     No data to display

## 2021-06-20 NOTE — Progress Notes (Signed)
Heart Failure Stewardship Pharmacist Progress Note   PCP: Pcp, No PCP-Cardiologist: None    HPI:  51 yo M with PMH of T2DM, syncope, PVCs, and heart murmur. He was last seen by cardiology in 2018. He presented to the ED on 6/11 with shortness of breath, cough, orthopnea, and bilateral LEE x 1.5 weeks. CXR on arrival with gross cardiomegaly and diffuse bilateral interstitial lung opacity (likely edema). CTA negative for PE but notable for vascular congestion. An ECHO was done on 6/12 and LVEF was <20% with mild LVH, mildly reduced RV, and mild MR. R/LHC on 6/13 with no evidence of CAD but elevated filling pressures (RA 16, PA 54, wedge 27, CO 5.8, CI 2.3). During the cath, he went into SVT 3 times requiring vagal maneuvers and adenosine with conversion to NSR.   Current HF Medications: Diuretic: furosemide 20 mg IV BID SGLT2i: Farxiga 10 mg daily Other: ivabradine 5 mg BID  Prior to admission HF Medications: None  Pertinent Lab Values: Serum creatinine 0.78, BUN 14, Potassium 3.6, Sodium 141, BNP 1295.7, Magnesium 2.1, A1c 7.8  Vital Signs: Weight: 288 lbs (admission weight: 290 lbs) Blood pressure: 120/80s  Heart rate: 100s - ST  I/O: -3L yesterday; net -6.3L  Medication Assistance / Insurance Benefits Check: Does the patient have prescription insurance?  No  Does the patient qualify for medication assistance through manufacturers or grants?   Yes Eligible grants and/or patient assistance programs: pending Medication assistance applications in progress: none  Medication assistance applications approved: none Approved medication assistance renewals will be completed by: [facility]  Outpatient Pharmacy:  Prior to admission outpatient pharmacy: [facility] Is the patient willing to use [facility] TOC pharmacy at discharge? Yes Is the patient willing to transition their outpatient pharmacy to utilize a [facility] outpatient pharmacy?   Pending    Assessment: 1. 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 NYHA class III symptoms. - Continue furosemide 20 mg IV BID. Great UOP yesterday and weight down 2 lbs. K low 3.6 - 40 mEq x 2 ordered for supplementation. Keep K>4 and Mag>2 - Hold off on adding BB until more euvolemic - Consider adding Entresto 24/26 mg BID for HFrEF optimization  - Agree with adding Farxiga 10 mg daily - On ivabradine 5 mg BID - continue until able to add BB - Consider adding cMRI (especially since patient may be uninsured) prior to discharge for continuing HF workup   Plan: 1) Medication changes recommended at this time: - Add Entresto 24/26 mg BID - cMRI  2) Patient assistance: - Patient uninsured - will need assistance for medications - Will begin patient assistance applications (once meds are added) and send off to [facility] Pharmacist for provider completion  3)  Education  - To be completed prior to discharge  Sharen Hones, PharmD, BCPS Heart Failure Engineer, building services Phone [phone]

## 2021-06-20 NOTE — Plan of Care (Signed)
  Problem: Education: Goal: Ability to describe self-care measures that may prevent or decrease complications (Diabetes Survival Skills Education) will improve Outcome: Progressing Goal: Individualized Educational Video(s) Outcome: Progressing   Problem: Coping: Goal: Ability to adjust to condition or change in health will improve Outcome: Progressing   Problem: Fluid Volume: Goal: Ability to maintain a balanced intake and output will improve Outcome: Progressing   Problem: Health Behavior/Discharge Planning: Goal: Ability to identify and utilize available resources and services will improve Outcome: Progressing Goal: Ability to manage health-related needs will improve Outcome: Progressing   Problem: Metabolic: Goal: Ability to maintain appropriate glucose levels will improve Outcome: Progressing   Problem: Nutritional: Goal: Maintenance of adequate nutrition will improve Outcome: Progressing Goal: Progress toward achieving an optimal weight will improve Outcome: Progressing   Problem: Tissue Perfusion: Goal: Adequacy of tissue perfusion will improve Outcome: Progressing   Problem: Skin Integrity: Goal: Risk for impaired skin integrity will decrease Outcome: Progressing   Problem: Education: Goal: Knowledge of General Education information will improve Description: Including pain rating scale, medication(s)/side effects and non-pharmacologic comfort measures Outcome: Progressing   Problem: Health Behavior/Discharge Planning: Goal: Ability to manage health-related needs will improve Outcome: Progressing   Problem: Clinical Measurements: Goal: Ability to maintain clinical measurements within normal limits will improve Outcome: Progressing Goal: Will remain free from infection Outcome: Progressing Goal: Diagnostic test results will improve Outcome: Progressing Goal: Respiratory complications will improve Outcome: Progressing Goal: Cardiovascular complication will  be avoided Outcome: Progressing   Problem: Activity: Goal: Risk for activity intolerance will decrease Outcome: Progressing   Problem: Nutrition: Goal: Adequate nutrition will be maintained Outcome: Progressing   Problem: Coping: Goal: Level of anxiety will decrease Outcome: Progressing   Problem: Elimination: Goal: Will not experience complications related to bowel motility Outcome: Progressing Goal: Will not experience complications related to urinary retention Outcome: Progressing   Problem: Pain Managment: Goal: General experience of comfort will improve Outcome: Progressing   Problem: Safety: Goal: Ability to remain free from injury will improve Outcome: Progressing   Problem: Skin Integrity: Goal: Risk for impaired skin integrity will decrease Outcome: Progressing   Problem: Education: Goal: Ability to demonstrate management of disease process will improve Outcome: Progressing Goal: Ability to verbalize understanding of medication therapies will improve Outcome: Progressing Goal: Individualized Educational Video(s) Outcome: Progressing   Problem: Activity: Goal: Capacity to carry out activities will improve Outcome: Progressing   Problem: Cardiac: Goal: Ability to achieve and maintain adequate cardiopulmonary perfusion will improve Outcome: Progressing   Problem: Education: Goal: Understanding of CV disease, CV risk reduction, and recovery process will improve Outcome: Progressing Goal: Individualized Educational Video(s) Outcome: Progressing   Problem: Cardiovascular: Goal: Ability to achieve and maintain adequate cardiovascular perfusion will improve Outcome: Progressing Goal: Vascular access site(s) Level 0-1 will be maintained Outcome: Progressing   Problem: Health Behavior/Discharge Planning: Goal: Ability to safely manage health-related needs after discharge will improve Outcome: Progressing

## 2021-06-20 NOTE — Progress Notes (Signed)
ANTICOAGULATION CONSULT NOTE - Initial Consult  Pharmacy Consult for IV heparin Indication: DVT  No Known Allergies  Patient Measurements: Height: 6\' 2"  (188 cm) Weight: 131 kg (288 lb 12.8 oz) IBW/kg (Calculated) : 82.2 Heparin Dosing Weight: ~111kg  Vital Signs: Temp: 98.3 F (36.8 C) (06/13 0757) Temp Source: Oral (06/13 0757) BP: 117/90 (06/13 0757) Pulse Rate: 93 (06/13 0757)  Labs: Recent Labs    06/18/21 1735 06/18/21 2016 06/19/21 0426 06/20/21 0407  HGB 12.6*  --  12.3* 12.2*  HCT 40.5  --  39.4 38.7*  PLT 368  --  341 311  CREATININE 0.95  --  0.93 0.78  TROPONINIHS 45* 49*  --   --     Estimated Creatinine Clearance: 160.7 mL/min (by C-G formula based on SCr of 0.78 mg/dL).   Medical History: History reviewed. No pertinent past medical history.  Medications:  Infusions:   heparin      Assessment: 51 yo male with age indeterminate DVT found on dopplers.  Chest CT neg PE.  Also with newly dx EF < 20%.  Goal of Therapy:  Heparin level 0.3-0.7 units/ml Monitor platelets by anticoagulation protocol: Yes   Plan:  Start IV heparin with bolus of 4000 units x 1 (a little conservative since going to heart cath soon).  Then start heparin gtt at 1550 units/hr. Check heparin level 6 hrs after gtt starts. Could transition to Eliquis s/p cath?  [place] Crane, Roylene Reason, BCCP Clinical Pharmacist  06/20/2021 10:52 AM   [facility] pharmacy phone numbers are listed on [email or web address]

## 2021-06-20 NOTE — Interval H&P Note (Signed)
History and Physical Interval Note:  06/20/2021 1:15 PM  Jerry Saunders  has presented today for surgery, with the diagnosis of chest pain.  The various methods of treatment have been discussed with the patient and family. After consideration of risks, benefits and other options for treatment, the patient has consented to  Procedure(s): RIGHT/LEFT HEART CATH AND CORONARY ANGIOGRAPHY (N/A) as a surgical intervention.  The patient's history has been reviewed, patient examined, no change in status, stable for surgery.  I have reviewed the patient's chart and labs.  Questions were answered to the patient's satisfaction.    2012 Appropriate Use Criteria for Diagnostic Catheterization Home / Select Test of Interest Indication for RHC Cardiomyopathies Cardiomyopathies (Right and Left Heart Catheterization OR Right Heart Catheterization Alone With/Wit Cardiomyopathies (Right and Left Heart Catheterization OR Right Heart Catheterization Alone With/Without Left Ventriculography and Coronary Angiography) Link Here: [email or web address] Indication:  Known or suspected cardiomyopathy with or without heart failure A (7) Indication: 93; Score 7   Beronica [place] J Annell Canty

## 2021-06-20 NOTE — Plan of Care (Signed)
  Problem: Metabolic: Goal: Ability to maintain appropriate glucose levels will improve Outcome: Progressing   Problem: Nutritional: Goal: Maintenance of adequate nutrition will improve Outcome: Progressing   Problem: Nutrition: Goal: Adequate nutrition will be maintained Outcome: Progressing   Problem: Elimination: Goal: Will not experience complications related to bowel motility Outcome: Progressing Goal: Will not experience complications related to urinary retention Outcome: Progressing   Problem: Pain Managment: Goal: General experience of comfort will improve Outcome: Progressing   Problem: Cardiovascular: Goal: Ability to achieve and maintain adequate cardiovascular perfusion will improve Outcome: Progressing Goal: Vascular access site(s) Level 0-1 will be maintained Outcome: Progressing   Problem: Clinical Measurements: Goal: Respiratory complications will improve Outcome: Not Progressing   Problem: Coping: Goal: Level of anxiety will decrease Outcome: Not Progressing

## 2021-06-20 NOTE — Progress Notes (Signed)
ANTICOAGULATION CONSULT NOTE - Initial Consult  Pharmacy Consult for IV heparin > Eliquis Indication: DVT  No Known Allergies  Patient Measurements: Height: 6\' 2"  (188 cm) Weight: 131 kg (288 lb 12.8 oz) IBW/kg (Calculated) : 82.2 Heparin Dosing Weight: ~111kg  Vital Signs: Temp: 98.5 F (36.9 C) (06/13 1430) Temp Source: Oral (06/13 1430) BP: 111/84 (06/13 1430) Pulse Rate: 105 (06/13 1430)  Labs: Recent Labs    06/18/21 1735 06/18/21 2016 06/19/21 0426 06/20/21 0407  HGB 12.6*  --  12.3* 12.2*  HCT 40.5  --  39.4 38.7*  PLT 368  --  341 311  CREATININE 0.95  --  0.93 0.78  TROPONINIHS 45* 49*  --   --      Estimated Creatinine Clearance: 160.7 mL/min (by C-G formula based on SCr of 0.78 mg/dL).   Medical History: History reviewed. No pertinent past medical history.  Medications:  Infusions:   sodium chloride     heparin 1,550 Units/hr (06/20/21 1200)    Assessment: 51 yo male with age indeterminate DVT found on dopplers.  Chest CT neg PE.  Also with newly dx EF < 20%.  Goal of Therapy:  Heparin level 0.3-0.7 units/ml Monitor platelets by anticoagulation protocol: Yes   Plan:  Start IV heparin with bolus of 4000 units x 1 (a little conservative since going to heart cath soon).  Then start heparin gtt at 1550 units/hr. Check heparin level 6 hrs after gtt starts. Could transition to Eliquis s/p cath?  [place] Crane, Roylene Reason, BCCP Clinical Pharmacist  06/20/2021 3:11 PM   [facility] pharmacy phone numbers are listed on [email or web address]  Addendum: Dicussed with Dr. Earnie Larsson and Dr. Tyrell Antonio, okay to convert to po Eliquis this evening.  Will start with loading dose of 10 mg BID x 7 days then 5 mg BID.  Pt does not have prescription drug insurance, will need to apply for pt assistance.  [place] Crane, Roylene Reason, BCCP Clinical Pharmacist  06/20/2021 3:21 PM   [facility] pharmacy phone numbers are listed on [email or web address]

## 2021-06-21 DIAGNOSIS — I5041 Acute combined systolic (congestive) and diastolic (congestive) heart failure: Secondary | ICD-10-CM

## 2021-06-21 LAB — CBC
HCT: 38.7 % — ABNORMAL LOW (ref 39.0–52.0)
Hemoglobin: 12 g/dL — ABNORMAL LOW (ref 13.0–17.0)
MCH: 27.2 pg (ref 26.0–34.0)
MCHC: 31 g/dL (ref 30.0–36.0)
MCV: 87.8 fL (ref 80.0–100.0)
Platelets: 339 10*3/uL (ref 150–400)
RBC: 4.41 MIL/uL (ref 4.22–5.81)
RDW: 14.3 % (ref 11.5–15.5)
WBC: 8.6 10*3/uL (ref 4.0–10.5)
nRBC: 0 % (ref 0.0–0.2)

## 2021-06-21 LAB — LIPID PANEL
Cholesterol: 78 mg/dL (ref 0–200)
HDL: 22 mg/dL — ABNORMAL LOW (ref >40)
LDL Cholesterol: 48 mg/dL (ref 0–99)
Total CHOL/HDL Ratio: 3.5 RATIO
Triglycerides: 39 mg/dL (ref <150)
VLDL: 8 mg/dL (ref 0–40)

## 2021-06-21 LAB — BASIC METABOLIC PANEL
Anion gap: 11 (ref 5–15)
BUN: 13 mg/dL (ref 6–20)
CO2: 28 mmol/L (ref 22–32)
Calcium: 8.6 mg/dL — ABNORMAL LOW (ref 8.9–10.3)
Chloride: 102 mmol/L (ref 98–111)
Creatinine, Ser: 0.91 mg/dL (ref 0.61–1.24)
GFR, Estimated: >60 mL/min (ref >60)
Glucose, Bld: 120 mg/dL — ABNORMAL HIGH (ref 70–99)
Potassium: 4.3 mmol/L (ref 3.5–5.1)
Sodium: 141 mmol/L (ref 135–145)

## 2021-06-21 LAB — POCT I-STAT EG7
Acid-Base Excess: 9 mmol/L — ABNORMAL HIGH (ref 0.0–2.0)
Bicarbonate: 34.6 mmol/L — ABNORMAL HIGH (ref 20.0–28.0)
Calcium, Ion: 1.09 mmol/L — ABNORMAL LOW (ref 1.15–1.40)
HCT: 39 % (ref 39.0–52.0)
Hemoglobin: 13.3 g/dL (ref 13.0–17.0)
O2 Saturation: 62 %
Potassium: 3.6 mmol/L (ref 3.5–5.1)
Sodium: 142 mmol/L (ref 135–145)
TCO2: 36 mmol/L — ABNORMAL HIGH (ref 22–32)
pCO2, Ven: 52.1 mmHg (ref 44–60)
pH, Ven: 7.43 (ref 7.25–7.43)
pO2, Ven: 32 mmHg (ref 32–45)

## 2021-06-21 LAB — HEPATIC FUNCTION PANEL
ALT: 34 U/L (ref 0–44)
AST: 22 U/L (ref 15–41)
Albumin: 3.2 g/dL — ABNORMAL LOW (ref 3.5–5.0)
Alkaline Phosphatase: 71 U/L (ref 38–126)
Bilirubin, Direct: 0.4 mg/dL — ABNORMAL HIGH (ref 0.0–0.2)
Indirect Bilirubin: 0.9 mg/dL (ref 0.3–0.9)
Total Bilirubin: 1.3 mg/dL — ABNORMAL HIGH (ref 0.3–1.2)
Total Protein: 6.7 g/dL (ref 6.5–8.1)

## 2021-06-21 LAB — HEPARIN LEVEL (UNFRACTIONATED): Heparin Unfractionated: >1.1 IU/mL — ABNORMAL HIGH (ref 0.30–0.70)

## 2021-06-21 LAB — GLUCOSE, CAPILLARY
Glucose-Capillary: 134 mg/dL — ABNORMAL HIGH (ref 70–99)
Glucose-Capillary: 133 mg/dL — ABNORMAL HIGH (ref 70–99)
Glucose-Capillary: 157 mg/dL — ABNORMAL HIGH (ref 70–99)
Glucose-Capillary: 192 mg/dL — ABNORMAL HIGH (ref 70–99)

## 2021-06-21 LAB — LDL CHOLESTEROL, DIRECT: Direct LDL: 51.4 mg/dL (ref 0–99)

## 2021-06-21 MED ORDER — FUROSEMIDE 10 MG/ML IJ SOLN
40.0000 mg | Freq: Three times a day (TID) | INTRAMUSCULAR | Status: DC
  Administered 2021-06-21 – 2021-06-22 (×5): 40 mg via INTRAVENOUS
  Filled 2021-06-21 (×5): qty 4

## 2021-06-21 MED ORDER — GUAIFENESIN-DM 100-10 MG/5ML PO SYRP
15.0000 mL | ORAL_SOLUTION | ORAL | Status: DC | PRN
  Administered 2021-06-21 – 2021-06-22 (×2): 15 mL via ORAL
  Filled 2021-06-21 (×3): qty 15

## 2021-06-21 MED ORDER — SPIRONOLACTONE 25 MG PO TABS
25.0000 mg | ORAL_TABLET | Freq: Every morning | ORAL | Status: DC
  Administered 2021-06-21 – 2021-06-23 (×3): 25 mg via ORAL
  Filled 2021-06-21 (×3): qty 1

## 2021-06-21 MED ORDER — ATORVASTATIN CALCIUM 10 MG PO TABS
20.0000 mg | ORAL_TABLET | Freq: Every day | ORAL | Status: DC
  Administered 2021-06-21 – 2021-06-22 (×2): 20 mg via ORAL
  Filled 2021-06-21 (×2): qty 2

## 2021-06-21 MED ORDER — ORAL CARE MOUTH RINSE: 15.0000 mL | OROMUCOSAL | Status: DC | PRN

## 2021-06-21 NOTE — Progress Notes (Addendum)
When patient awake, O2 sats 90s on RA.  When patient is asleep, sats range from 60-80s, dropping as low as 53% at one point.  Patient awakened to raise sats. While asleep, nasal cannula which as been adjusted between 2-4L this shift, is often either removed or misaligned with nostrils.  Patient states he wants O2 at "lowest setting" because he feels there is too much air coming out. RN attempted to put venturi mask on patient as sats continue to drop but patient states he "can't wear a mask".  This is d/t claustrophobia.  HR has also fluctuated (50s-120s) with various levels of oxygen saturation. Patient currently on 4L [place]. Will continue to monitor.

## 2021-06-21 NOTE — Progress Notes (Signed)
PROGRESS NOTE  Jerry Saunders  [id number] DOB: 1970/01/24 DOA: 06/18/2021 PCP: Pcp, No   Brief Narrative: Patient is a 51 year old male with past medical history of diabetes type 2 not on medication, PVC, cardiac murmur last evaluated by cardiology at [facility], uninsured who presented with complaints of worsening shortness of breath, cough, orthopnea, bilateral lower extremity edema.  Echocardiogram done here showed EF of less than 20%.  Underwent ischemic work-up by cardiac cath which did not show any significant coronary artery disease.  Patient currently being managed for decompensated nonischemic cardiomyopathy.  Cardiology following.  On IV diuresis for volume overload, planning for cardiac MRI.   Assessment & Plan:  Principal Problem:   Acute combined systolic and diastolic congestive heart failure (HCC) Active Problems:   CHF (congestive heart failure) (HCC)   Acute respiratory failure with hypoxia (HCC)   Elevated d-dimer   Type 2 diabetes mellitus (HCC)   Normocytic anemia   DVT (deep venous thrombosis) (HCC)   Acute combined systolic/diastolic congestive heart failure/nonischemic cardiomyopathy: Presented with shortness of breath, cough, orthopnea, bilateral lower EXTR edema.  Found to be volume overloaded on presentation.  BNP elevated.  Echo done here showed EF of less than 123456, diastolic dysfunction, reduced right ventricular function.  Underwent ischemic work-up by cardiac cath without finding of coronary artery disease.  Started on [facility] following.  Unclear etiology for cardiomyopathy.Planning for cardiac MRI.  Continue IV diuresis.  Continue to monitor daily weight, input/output. LDL of 48  Acute hypoxic respiratory failure: Currently requiring 3 to 4 L of oxygen per minute.  Not on oxygen at home.  This is secondary to volume overload from severe congestive heart failure.  Continue Lasix at current dose.  Diabetes type 2: Continue current insulin  regimen.  Monitor blood sugars.  Hemoglobin A1c of  7.8  Left gastrinomas vein DVT, indeterminate: D-dimer was elevated.  CT PE protocol was negative for PE.  Initially started on heparin drip,  transitioned to Eliquis.  Elevated LFTs: Likely from hepatic congestion.  Resolved  Elevated troponin: Denies any chest pain.  This is secondary to severe congestive heart failure.  Normocytic anemia: Currently hemoglobin stable  Uninsured: TOC following.  Needs assistance with medications.          DVT prophylaxis:SCD's Start: 06/20/21 1422 apixaban (ELIQUIS) tablet 10 mg  apixaban (ELIQUIS) tablet 5 mg     Code Status: Full Code  Family Communication: None at bedside  Patient status:Inpatient  Patient is from :Home  Anticipated discharge [id number]  Estimated DC date: After cardiology clearance   Consultants: Cardiology  Procedures:Cardiac cath  Antimicrobials:  Anti-infectives (From admission, onward)    None       Subjective: Patient seen and examined at the bedside this morning.  Hemodynamically stable.  Sitting at age of the bed, talking on phone.  He was on room air and saturating around 90 to 92%.  Denies any worsening shortness of breath or cough,lower extremity edema improving. Objective: Vitals:   06/20/21 2331 06/21/21 0310 06/21/21 0511 06/21/21 0800  BP: 107/86 112/82  114/86  Pulse: (!) 104 93  100  Resp: 20 17  19   Temp: 98.1 F (36.7 C) (P) 98 F (36.7 C)  98.5 F (36.9 C)  TempSrc: Oral Oral  Oral  SpO2: 93% 91% 95% 94%  Weight:  (P) 128.1 kg    Height:        Intake/Output Summary (Last 24 hours) at 06/21/2021 0842 Last data filed at 06/21/2021 [phone]  Gross per 24 hour  Intake 1060 ml  Output 4760 ml  Net -3700 ml   Filed Weights   06/20/21 0500 06/20/21 1153 06/21/21 0310  Weight: 131 kg 131 kg (P) 128.1 kg    Examination:  General exam: Overall comfortable, not in distress HEENT: PERRL Respiratory system:  no wheezes or crackles   Cardiovascular system: S1 & S2 heard, RRR.  Gastrointestinal system: Abdomen is nondistended, soft and nontender. Central nervous system: Alert and oriented Extremities: trace bilateral lower extremity edema, no clubbing ,no cyanosis Skin: No rashes, no ulcers,no icterus     Data Reviewed: I have personally reviewed following labs and imaging studies  CBC: Recent Labs  Lab 06/18/21 1735 06/19/21 0426 06/20/21 0407 06/20/21 1338 06/20/21 1339 06/20/21 1346 06/21/21 0209  WBC 7.8 9.3 8.1  --   --   --  8.6  NEUTROABS 4.5  --   --   --   --   --   --   HGB 12.6* 12.3* 12.2* 13.6 13.3 12.9* 12.0*  HCT 40.5 39.4 38.7* 40.0 39.0 38.0* 38.7*  MCV 89.2 88.7 87.4  --   --   --  87.8  PLT 368 341 311  --   --   --  [id number]   Basic Metabolic Panel: Recent Labs  Lab 06/18/21 1735 06/19/21 0426 06/20/21 0407 06/20/21 1338 06/20/21 1339 06/20/21 1346 06/21/21 0209  NA 138 139 141 143 142 143 141  K 4.1 4.1 3.6 3.7 3.6 3.5 4.3  CL 103 103 103  --   --   --  102  CO2 27 28 29   --   --   --  28  GLUCOSE 142* 192* 138*  --   --   --  120*  BUN 16 14 14   --   --   --  13  CREATININE 0.95 0.93 0.78  --   --   --  0.91  CALCIUM 8.7* 8.4* 8.2*  --   --   --  8.6*  MG  --   --  2.1  --   --   --   --      Recent Results (from the past 240 hour(s))  MRSA Next Gen by PCR, Nasal     Status: None   Collection Time: 06/19/21  9:24 PM   Specimen: Nasal Mucosa; Nasal Swab  Result Value Ref Range Status   MRSA by PCR Next Gen NOT DETECTED NOT DETECTED Final    Comment: (NOTE) The GeneXpert MRSA Assay (FDA approved for NASAL specimens only), is one component of a comprehensive MRSA colonization surveillance program. It is not intended to diagnose MRSA infection nor to guide or monitor treatment for MRSA infections. Test performance is not FDA approved in patients less than 30 years old. Performed at [facility], [place] [address], [place], [place] [address]   Respiratory (~20  pathogens) panel by PCR     Status: None   Collection Time: 06/20/21  6:42 PM   Specimen: Nasopharyngeal Swab; Respiratory  Result Value Ref Range Status   Adenovirus NOT DETECTED NOT DETECTED Final   Coronavirus 229E NOT DETECTED NOT DETECTED Final    Comment: (NOTE) The Coronavirus on the Respiratory Panel, DOES NOT test for the novel  Coronavirus (2019 nCoV)    Coronavirus HKU1 NOT DETECTED NOT DETECTED Final   Coronavirus NL63 NOT DETECTED NOT DETECTED Final   Coronavirus OC43 NOT DETECTED NOT DETECTED Final   Metapneumovirus NOT DETECTED  NOT DETECTED Final   Rhinovirus / Enterovirus NOT DETECTED NOT DETECTED Final   Influenza A NOT DETECTED NOT DETECTED Final   Influenza B NOT DETECTED NOT DETECTED Final   Parainfluenza Virus 1 NOT DETECTED NOT DETECTED Final   Parainfluenza Virus 2 NOT DETECTED NOT DETECTED Final   Parainfluenza Virus 3 NOT DETECTED NOT DETECTED Final   Parainfluenza Virus 4 NOT DETECTED NOT DETECTED Final   Respiratory Syncytial Virus NOT DETECTED NOT DETECTED Final   Bordetella pertussis NOT DETECTED NOT DETECTED Final   Bordetella Parapertussis NOT DETECTED NOT DETECTED Final   Chlamydophila pneumoniae NOT DETECTED NOT DETECTED Final   Mycoplasma pneumoniae NOT DETECTED NOT DETECTED Final    Comment: Performed at [facility], [place] [address], [place], [place] [address]     Radiology Studies: CARDIAC CATHETERIZATION  Result Date: 06/20/2021 Images from the original result were not included. Right dominant circulation, normal coronaries No angiographic evidence of coronary artery disease RA: 16 mmHg RV: 69/11 mmHg, RVEDP 24 mmHg PA: 67/43 mmHg, mPAP 54 mmHg PCW: 27 mmHg CO: 5.8 L/min CI: 2.3 L/min/m2 Patient went into SVT 3 times with any minimal irritation of LV during left heart catheterization, requiring vagal maneuvers, adenosine 6 mg, 12 mg, with conversion to sinus rhythm Conclusion: Decompensated nonischemic cardiomyopathy Consider dilated or  arrhythmia induced cardiomyopathy Severe pulmonary hypertension, WHO grp II GDMT for HFrEF with continued workup for etiology evaluation Nigel Mormon, MD Pager: [phone] Office: [phone]  VAS [place] LOWER EXTREMITY VENOUS (DVT)  Result Date: 06/19/2021  Lower Venous DVT Study Patient Name:  Jerry Saunders  Date of Exam:   06/19/2021 Medical Rec #: [id number]      Accession #:    [id number] Date of Birth: 02-Jan-1971      Patient Gender: M Patient Age:   36 years Exam Location:  [facility] Procedure:      VAS [place] LOWER EXTREMITY VENOUS (DVT) Referring Phys: Wandra Feinstein RATHORE --------------------------------------------------------------------------------  Indications: Edema, shortness of breath, elevated d-dimer.  Comparison Study: No prior studies. Performing Technologist: Darlin Coco RDMS, RVT  Examination Guidelines: A complete evaluation includes B-mode imaging, spectral Doppler, color Doppler, and power Doppler as needed of all accessible portions of each vessel. Bilateral testing is considered an integral part of a complete examination. Limited examinations for reoccurring indications may be performed as noted. The reflux portion of the exam is performed with the patient in reverse Trendelenburg.  +---------+---------------+---------+-----------+----------+--------------+ RIGHT    CompressibilityPhasicitySpontaneityPropertiesThrombus Aging +---------+---------------+---------+-----------+----------+--------------+ CFV      Full           No       Yes                                 +---------+---------------+---------+-----------+----------+--------------+ SFJ      Full                                                        +---------+---------------+---------+-----------+----------+--------------+ FV Prox  Full                                                        +---------+---------------+---------+-----------+----------+--------------+  FV Mid   Full                                                         +---------+---------------+---------+-----------+----------+--------------+ FV DistalFull                                                        +---------+---------------+---------+-----------+----------+--------------+ PFV      Full                                                        +---------+---------------+---------+-----------+----------+--------------+ POP      Full           No       Yes                                 +---------+---------------+---------+-----------+----------+--------------+ PTV      Full                                                        +---------+---------------+---------+-----------+----------+--------------+ PERO     Full                                                        +---------+---------------+---------+-----------+----------+--------------+ Gastroc  Full                                                        +---------+---------------+---------+-----------+----------+--------------+   +---------+---------------+---------+-----------+----------+-----------------+ LEFT     CompressibilityPhasicitySpontaneityPropertiesThrombus Aging    +---------+---------------+---------+-----------+----------+-----------------+ CFV      Full           No       Yes                                    +---------+---------------+---------+-----------+----------+-----------------+ SFJ      Full                                                           +---------+---------------+---------+-----------+----------+-----------------+ FV Prox  Full                                                           +---------+---------------+---------+-----------+----------+-----------------+  FV Mid   Full                                                           +---------+---------------+---------+-----------+----------+-----------------+ FV DistalFull                                                            +---------+---------------+---------+-----------+----------+-----------------+ PFV      Full                                                           +---------+---------------+---------+-----------+----------+-----------------+ POP      Full           No       Yes                                    +---------+---------------+---------+-----------+----------+-----------------+ PTV      Full                                                           +---------+---------------+---------+-----------+----------+-----------------+ PERO     Full                                                           +---------+---------------+---------+-----------+----------+-----------------+ Gastroc  None           No       No                   Age Indeterminate +---------+---------------+---------+-----------+----------+-----------------+     Summary: RIGHT: - There is no evidence of deep vein thrombosis in the lower extremity.  - No cystic structure found in the popliteal fossa.  LEFT: - Findings consistent with age indeterminate deep vein thrombosis involving the left gastrocnemius veins.  - No cystic structure found in the popliteal fossa.  *See table(s) above for measurements and observations. Electronically signed by Servando Snare MD on 06/19/2021 at 6:03:56 PM.    Final    ECHOCARDIOGRAM COMPLETE  Result Date: 06/19/2021    ECHOCARDIOGRAM REPORT   Patient Name:   Jerry Saunders Date of Exam: 06/19/2021 Medical Rec #:  [id number]     Height:       74.0 in Accession #:    [id number]    Weight:       270.0 lb Date of Birth:  10/09/1970     BSA:          2.469 m Patient Age:    27 years      BP:  104/72 mmHg Patient Gender: M             HR:           107 bpm. Exam Location:  Inpatient Procedure: 2D Echo, Cardiac Doppler, Color Doppler and Intracardiac            Opacification Agent Indications:    Congestive heart failure  History:        Patient  has no prior history of Echocardiogram examinations.                 Abnormal ECG, Signs/Symptoms:Shortness of Breath and Cough; Risk                 Factors:Diabetes.  Sonographer:    Joette Catching RCS Referring Phys: [id number] [facility] RATHORE  Sonographer Comments: Image acquisition challenging due to patient body habitus. IMPRESSIONS  1. Left ventricular ejection fraction, by estimation, is <20%. The left ventricle has severely decreased function. The left ventricle demonstrates global hypokinesis. The left ventricular internal cavity size was moderately to severely dilated. There is  mild left ventricular hypertrophy. Left ventricular diastolic parameters were normal.  2. Right ventricular systolic function is mildly reduced. The right ventricular size is not well visualized. There is normal pulmonary artery systolic pressure.  3. Left atrial size was mild to moderately dilated.  4. The mitral valve is normal in structure. Mild mitral valve regurgitation.  5. The aortic valve is tricuspid. Aortic valve regurgitation is not visualized.  6. Aortic no significant ascending aneurysm.  7. The inferior vena cava is dilated in size with >50% respiratory variability, suggesting right atrial pressure of 8 mmHg. Comparison(s): No prior Echocardiogram. FINDINGS  Left Ventricle: Left ventricular ejection fraction, by estimation, is <20%. The left ventricle has severely decreased function. The left ventricle demonstrates global hypokinesis. Definity contrast agent was given IV to delineate the left ventricular endocardial borders. The left ventricular internal cavity size was moderately to severely dilated. There is mild left ventricular hypertrophy. Left ventricular diastolic parameters were normal. Right Ventricle: The right ventricular size is not well visualized. Right vetricular wall thickness was not well visualized. Right ventricular systolic function is mildly reduced. There is normal pulmonary artery systolic  pressure. The tricuspid regurgitant velocity is 2.40 m/s, and with an assumed right atrial pressure of 8 mmHg, the estimated right ventricular systolic pressure is 0000000 mmHg. Left Atrium: Left atrial size was mild to moderately dilated. Right Atrium: Right atrial size was normal in size. Pericardium: There is no evidence of pericardial effusion. Mitral Valve: The mitral valve is normal in structure. Mild mitral valve regurgitation. Tricuspid Valve: The tricuspid valve is normal in structure. Tricuspid valve regurgitation is trivial. Aortic Valve: The aortic valve is tricuspid. Aortic valve regurgitation is not visualized. Aortic valve mean gradient measures 4.0 mmHg. Aortic valve peak gradient measures 5.4 mmHg. Aortic valve area, by VTI measures 2.57 cm. Pulmonic Valve: The pulmonic valve was not well visualized. Pulmonic valve regurgitation is not visualized. Aorta: No significant ascending aneurysm. Venous: The inferior vena cava is dilated in size with greater than 50% respiratory variability, suggesting right atrial pressure of 8 mmHg.  LEFT VENTRICLE PLAX 2D LVIDd:         6.70 cm   Diastology LVIDs:         6.20 cm   LV e' medial:    5.35 cm/s LV PW:         1.20 cm   LV E/e' medial:  18.4 LV IVS:  1.10 cm   LV e' lateral:   14.80 cm/s LVOT diam:     2.10 cm   LV E/e' lateral: 6.7 LV SV:         56 LV SV Index:   23 LVOT Area:     3.46 cm  RIGHT VENTRICLE             IVC RV Basal diam:  5.80 cm     IVC diam: 2.80 cm RV Mid diam:    4.10 cm RV S prime:     15.50 cm/s TAPSE (M-mode): 1.5 cm LEFT ATRIUM              Index        RIGHT ATRIUM           Index LA diam:        5.70 cm  2.31 cm/m   RA Area:     26.20 cm LA Vol (A2C):   149.0 ml 60.35 ml/m  RA Volume:   94.70 ml  38.35 ml/m LA Vol (A4C):   82.0 ml  33.21 ml/m LA Biplane Vol: 116.0 ml 46.98 ml/m  AORTIC VALVE                    PULMONIC VALVE AV Area (Vmax):    3.49 cm     PV Vmax:          0.93 m/s AV Area (Vmean):   3.06 cm     PV  Peak grad:     3.5 mmHg AV Area (VTI):     2.57 cm     PR End Diast Vel: 11.56 msec AV Vmax:           116.00 cm/s AV Vmean:          97.100 cm/s AV VTI:            0.218 m AV Peak Grad:      5.4 mmHg AV Mean Grad:      4.0 mmHg LVOT Vmax:         117.00 cm/s LVOT Vmean:        85.900 cm/s LVOT VTI:          0.162 m LVOT/AV VTI ratio: 0.74  AORTA Ao Root diam: 3.60 cm Ao Asc diam:  3.30 cm MITRAL VALVE               TRICUSPID VALVE MV Area (PHT): 7.90 cm    TR Peak grad:   23.0 mmHg MV Decel Time: 96 msec     TR Vmax:        240.00 cm/s MR Peak grad: 58.4 mmHg MR Mean grad: 41.0 mmHg    SHUNTS MR Vmax:      382.00 cm/s  Systemic VTI:  0.16 m MR Vmean:     311.0 cm/s   Systemic Diam: 2.10 cm MV E velocity: 98.50 cm/s MV A velocity: 52.50 cm/s MV E/A ratio:  1.88 Landscape architect signed by Phineas Inches Signature Date/Time: 06/19/2021/3:02:05 PM    Final     Scheduled Meds:  apixaban  10 mg Oral BID   Followed by   Derrill Memo ON 06/27/2021] apixaban  5 mg Oral BID   dapagliflozin propanediol  10 mg Oral Daily   furosemide  40 mg Intravenous BID   insulin aspart  0-5 Units Subcutaneous QHS   insulin aspart  0-9 Units Subcutaneous TID WC   ivabradine  5 mg Oral BID WC  LORazepam  1 mg Intravenous Once   metoprolol tartrate  5 mg Intravenous Once   sodium chloride flush  3 mL Intravenous Q12H   sodium chloride flush  3 mL Intravenous Q12H   Continuous Infusions:  sodium chloride       LOS: 3 days   Shelly Coss, MD [facility] P6/14/2023, 8:42 AM

## 2021-06-21 NOTE — Progress Notes (Signed)
Patient sats well, in 90's on room air, even with ambulation, but when he sleeps, he desats to 85 on room air---I placed him back on nasal cannula 4L while sleeping and he sats back up to mid 90's

## 2021-06-21 NOTE — Progress Notes (Signed)
Mobility Specialist Progress Note    06/21/21 1012  Mobility  Activity Ambulated independently in hallway  Level of Assistance Independent  Assistive Device None  Distance Ambulated (ft) 400 ft  Activity Response Tolerated well  $Mobility charge 1 Mobility   Pre-Mobility: 99 HR, 93% SpO2 During Mobility: 107 HR, 92% SpO2 Post-Mobility: 97 HR, 94% SpO2  Pt received sitting EOB and agreeable. No complaints on walk. Able to maintain SpO2 >/=89% on RA. Returned to chair with call bell in reach.    [place] Mobility Specialist

## 2021-06-21 NOTE — Plan of Care (Signed)
  Problem: Coping: Goal: Ability to adjust to condition or change in health will improve Outcome: Progressing   Problem: Fluid Volume: Goal: Ability to maintain a balanced intake and output will improve Outcome: Progressing   Problem: Skin Integrity: Goal: Risk for impaired skin integrity will decrease Outcome: Progressing   Problem: Clinical Measurements: Goal: Respiratory complications will improve Outcome: Progressing   Problem: Activity: Goal: Risk for activity intolerance will decrease Outcome: Progressing   Problem: Coping: Goal: Level of anxiety will decrease Outcome: Progressing   Problem: Elimination: Goal: Will not experience complications related to bowel motility Outcome: Progressing Goal: Will not experience complications related to urinary retention Outcome: Progressing   Problem: Pain Managment: Goal: General experience of comfort will improve Outcome: Progressing   Problem: Education: Goal: Ability to verbalize understanding of medication therapies will improve Outcome: Progressing   Problem: Cardiovascular: Goal: Vascular access site(s) Level 0-1 will be maintained Outcome: Progressing

## 2021-06-21 NOTE — Progress Notes (Signed)
Progress Note  Patient Name: Jerry Saunders Date of Encounter: 06/21/2021  Attending physician: Burnadette Pop, MD Primary care provider: Pcp, No   Subjective: Alva Broxson is a 51 y.o. male who was seen and examined at bedside  Denies any anginal discomfort. Shortness of breath and lower extremity swelling improving Case discussed and reviewed with his nurse.  Objective: Vital Signs in the last 24 hours: Temp:  [98 F (36.7 C)-98.8 F (37.1 C)] 98.5 F (36.9 C) (06/14 0800) Pulse Rate:  [89-170] 100 (06/14 0800) Resp:  [16-41] 19 (06/14 0800) BP: (107-131)/(76-101) 114/86 (06/14 0800) SpO2:  [84 %-97 %] 94 % (06/14 0800) Weight:  [128.1 kg-131 kg] (P) 128.1 kg (06/14 0310)  Intake/Output:  Intake/Output Summary (Last 24 hours) at 06/21/2021 0944 Last data filed at 06/21/2021 0756 Gross per 24 hour  Intake 1060 ml  Output 3360 ml  Net -2300 ml    Net IO Since Admission: -8,035 mL [06/21/21 0944]  Weights:  Filed Weights   06/20/21 0500 06/20/21 1153 06/21/21 0310  Weight: 131 kg 131 kg (P) 128.1 kg    Telemetry: Personally reviewed.  Predominately normal sinus rhythm with rare ventricular ectopy.  Physical examination: PHYSICAL EXAM:    06/21/2021    8:00 AM 06/21/2021    3:10 AM 06/20/2021   11:31 PM  Vitals with BMI  Weight  282 lbs 7 oz   BMI  36.24   Systolic 114 112 161  Diastolic 86 82 86  Pulse 100 93 104    CONSTITUTIONAL: Well-developed and well-nourished. No acute distress.  SKIN: Skin is warm and dry. No rash noted. No cyanosis. No pallor. No jaundice HEAD: Normocephalic and atraumatic.  EYES: No scleral icterus MOUTH/THROAT: Moist oral membranes.  NECK: JVD present. No thyromegaly noted. No carotid bruits  CHEST Normal respiratory effort. No intercostal retractions  LUNGS: Clear to auscultation bilaterally.  No stridor. No wheezes. No rales.  CARDIOVASCULAR: Regular rate and rhythm, positive S1-S2, no murmurs rubs or gallops  appreciated. ABDOMINAL: Obese, soft, nontender, nondistended, positive bowel sounds in all 4 quadrants, no apparent ascites.  EXTREMITIES: No pitting edema, warm to touch, 2+ bilateral DP and PT pulses HEMATOLOGIC: No significant bruising NEUROLOGIC: Oriented to person, place, and time. Nonfocal. Normal muscle tone.  PSYCHIATRIC: Normal mood and affect. Normal behavior. Cooperative  Lab Results: Hematology Recent Labs  Lab 06/19/21 0426 06/20/21 0407 06/20/21 1338 06/20/21 1339 06/20/21 1346 06/21/21 0209  WBC 9.3 8.1  --   --   --  8.6  RBC 4.44 4.43  --   --   --  4.41  HGB 12.3* 12.2*   < > 13.3 12.9* 12.0*  HCT 39.4 38.7*   < > 39.0 38.0* 38.7*  MCV 88.7 87.4  --   --   --  87.8  MCH 27.7 27.5  --   --   --  27.2  MCHC 31.2 31.5  --   --   --  31.0  RDW 14.2 14.2  --   --   --  14.3  PLT 341 311  --   --   --  339   < > = values in this interval not displayed.    Chemistry Recent Labs  Lab 06/18/21 1735 06/19/21 0426 06/20/21 0407 06/20/21 1338 06/20/21 1339 06/20/21 1346 06/21/21 0209  NA 138 139 141   < > 142 143 141  K 4.1 4.1 3.6   < > 3.6 3.5 4.3  CL 103 103 103  --   --   --  102  CO2 27 28 29   --   --   --  28  GLUCOSE 142* 192* 138*  --   --   --  120*  BUN 16 14 14   --   --   --  13  CREATININE 0.95 0.93 0.78  --   --   --  0.91  CALCIUM 8.7* 8.4* 8.2*  --   --   --  8.6*  PROT 7.0 6.5  --   --   --   --  6.7  ALBUMIN 3.5 3.3*  --   --   --   --  3.2*  AST 24 21  --   --   --   --  22  ALT 53* 48*  --   --   --   --  34  ALKPHOS 81 78  --   --   --   --  71  BILITOT 1.2 1.0  --   --   --   --  1.3*  GFRNONAA >60 >60 >60  --   --   --  >60  ANIONGAP 8 8 9   --   --   --  11   < > = values in this interval not displayed.     Cardiac Enzymes: Cardiac Panel (last 3 results) Recent Labs    06/18/21 1735 06/18/21 2016  TROPONINIHS 45* 49*    BNP (last 3 results) Recent Labs    06/18/21 1736  BNP 1,295.7*    ProBNP (last 3 results) No  results for input(s): "PROBNP" in the last 8760 hours.   DDimer  Recent Labs  Lab 06/18/21 1735  DDIMER 2.53*     Hemoglobin A1c:  Lab Results  Component Value Date   HGBA1C 7.8 (H) 06/19/2021   MPG 177.16 06/19/2021    TSH  Recent Labs    06/19/21 0045  TSH 1.260    Lipid Panel     Component Value Date/Time   CHOL 78 06/21/2021 0209   TRIG 39 06/21/2021 0209   HDL 22 (L) 06/21/2021 0209   CHOLHDL 3.5 06/21/2021 0209   VLDL 8 06/21/2021 0209   LDLCALC 48 06/21/2021 0209   LDLDIRECT 51.4 06/21/2021 0209    Imaging: CARDIAC CATHETERIZATION  Result Date: 06/20/2021 Images from the original result were not included. Right dominant circulation, normal coronaries No angiographic evidence of coronary artery disease RA: 16 mmHg RV: 69/11 mmHg, RVEDP 24 mmHg PA: 67/43 mmHg, mPAP 54 mmHg PCW: 27 mmHg CO: 5.8 L/min CI: 2.3 L/min/m2 Patient went into SVT 3 times with any minimal irritation of LV during left heart catheterization, requiring vagal maneuvers, adenosine 6 mg, 12 mg, with conversion to sinus rhythm Conclusion: Decompensated nonischemic cardiomyopathy Consider dilated or arrhythmia induced cardiomyopathy Severe pulmonary hypertension, WHO grp II GDMT for HFrEF with continued workup for etiology evaluation 06/23/2021, MD Pager: [phone] Office: [phone]  VAS Elder Negus LOWER EXTREMITY VENOUS (DVT)  Result Date: 06/19/2021  Lower Venous DVT Study Patient Name:  LOTT SEELBACH  Date of Exam:   06/19/2021 Medical Rec #: [id number]      Accession #:    Vivia [place] Date of Birth: 1971/01/02      Patient Gender: M Patient Age:   33 years Exam Location:  [facility] Procedure:      VAS 10/10/1970 LOWER EXTREMITY VENOUS (DVT) Referring Phys: 54 RATHORE --------------------------------------------------------------------------------  Indications: Edema, shortness of breath, elevated d-dimer.  Comparison Study: No prior studies. Performing  Technologist: Jean Rosenthal RDMS,  RVT  Examination Guidelines: A complete evaluation includes B-mode imaging, spectral Doppler, color Doppler, and power Doppler as needed of all accessible portions of each vessel. Bilateral testing is considered an integral part of a complete examination. Limited examinations for reoccurring indications may be performed as noted. The reflux portion of the exam is performed with the patient in reverse Trendelenburg.  +---------+---------------+---------+-----------+----------+--------------+ RIGHT    CompressibilityPhasicitySpontaneityPropertiesThrombus Aging +---------+---------------+---------+-----------+----------+--------------+ CFV      Full           No       Yes                                 +---------+---------------+---------+-----------+----------+--------------+ SFJ      Full                                                        +---------+---------------+---------+-----------+----------+--------------+ FV Prox  Full                                                        +---------+---------------+---------+-----------+----------+--------------+ FV Mid   Full                                                        +---------+---------------+---------+-----------+----------+--------------+ FV DistalFull                                                        +---------+---------------+---------+-----------+----------+--------------+ PFV      Full                                                        +---------+---------------+---------+-----------+----------+--------------+ POP      Full           No       Yes                                 +---------+---------------+---------+-----------+----------+--------------+ PTV      Full                                                        +---------+---------------+---------+-----------+----------+--------------+ PERO     Full                                                         +---------+---------------+---------+-----------+----------+--------------+  Gastroc  Full                                                        +---------+---------------+---------+-----------+----------+--------------+   +---------+---------------+---------+-----------+----------+-----------------+ LEFT     CompressibilityPhasicitySpontaneityPropertiesThrombus Aging    +---------+---------------+---------+-----------+----------+-----------------+ CFV      Full           No       Yes                                    +---------+---------------+---------+-----------+----------+-----------------+ SFJ      Full                                                           +---------+---------------+---------+-----------+----------+-----------------+ FV Prox  Full                                                           +---------+---------------+---------+-----------+----------+-----------------+ FV Mid   Full                                                           +---------+---------------+---------+-----------+----------+-----------------+ FV DistalFull                                                           +---------+---------------+---------+-----------+----------+-----------------+ PFV      Full                                                           +---------+---------------+---------+-----------+----------+-----------------+ POP      Full           No       Yes                                    +---------+---------------+---------+-----------+----------+-----------------+ PTV      Full                                                           +---------+---------------+---------+-----------+----------+-----------------+ PERO     Full                                                           +---------+---------------+---------+-----------+----------+-----------------+  Gastroc  None           No       No                    Age Indeterminate +---------+---------------+---------+-----------+----------+-----------------+     Summary: RIGHT: - There is no evidence of deep vein thrombosis in the lower extremity.  - No cystic structure found in the popliteal fossa.  LEFT: - Findings consistent with age indeterminate deep vein thrombosis involving the left gastrocnemius veins.  - No cystic structure found in the popliteal fossa.  *See table(s) above for measurements and observations. Electronically signed by Lemar Livings MD on 06/19/2021 at 6:03:56 PM.    Final    ECHOCARDIOGRAM COMPLETE  Result Date: 06/19/2021    ECHOCARDIOGRAM REPORT   Patient Name:   JAWON DIPIERO Date of Exam: 06/19/2021 Medical Rec #:  [id number]     Height:       74.0 in Accession #:    [id number]    Weight:       270.0 lb Date of Birth:  1970/03/15     BSA:          2.469 m Patient Age:    50 years      BP:           104/72 mmHg Patient Gender: M             HR:           107 bpm. Exam Location:  Inpatient Procedure: 2D Echo, Cardiac Doppler, Color Doppler and Intracardiac            Opacification Agent Indications:    Congestive heart failure  History:        Patient has no prior history of Echocardiogram examinations.                 Abnormal ECG, Signs/Symptoms:Shortness of Breath and Cough; Risk                 Factors:Diabetes.  Sonographer:    Rodrigo Ran RCS Referring Phys: [id number] [facility] RATHORE  Sonographer Comments: Image acquisition challenging due to patient body habitus. IMPRESSIONS  1. Left ventricular ejection fraction, by estimation, is <20%. The left ventricle has severely decreased function. The left ventricle demonstrates global hypokinesis. The left ventricular internal cavity size was moderately to severely dilated. There is  mild left ventricular hypertrophy. Left ventricular diastolic parameters were normal.  2. Right ventricular systolic function is mildly reduced. The right ventricular size is not well visualized. There is normal  pulmonary artery systolic pressure.  3. Left atrial size was mild to moderately dilated.  4. The mitral valve is normal in structure. Mild mitral valve regurgitation.  5. The aortic valve is tricuspid. Aortic valve regurgitation is not visualized.  6. Aortic no significant ascending aneurysm.  7. The inferior vena cava is dilated in size with >50% respiratory variability, suggesting right atrial pressure of 8 mmHg. Comparison(s): No prior Echocardiogram. FINDINGS  Left Ventricle: Left ventricular ejection fraction, by estimation, is <20%. The left ventricle has severely decreased function. The left ventricle demonstrates global hypokinesis. Definity contrast agent was given IV to delineate the left ventricular endocardial borders. The left ventricular internal cavity size was moderately to severely dilated. There is mild left ventricular hypertrophy. Left ventricular diastolic parameters were normal. Right Ventricle: The right ventricular size is not well visualized. Right vetricular wall thickness was not well visualized. Right ventricular systolic function is mildly reduced. There  is normal pulmonary artery systolic pressure. The tricuspid regurgitant velocity is 2.40 m/s, and with an assumed right atrial pressure of 8 mmHg, the estimated right ventricular systolic pressure is 31.0 mmHg. Left Atrium: Left atrial size was mild to moderately dilated. Right Atrium: Right atrial size was normal in size. Pericardium: There is no evidence of pericardial effusion. Mitral Valve: The mitral valve is normal in structure. Mild mitral valve regurgitation. Tricuspid Valve: The tricuspid valve is normal in structure. Tricuspid valve regurgitation is trivial. Aortic Valve: The aortic valve is tricuspid. Aortic valve regurgitation is not visualized. Aortic valve mean gradient measures 4.0 mmHg. Aortic valve peak gradient measures 5.4 mmHg. Aortic valve area, by VTI measures 2.57 cm. Pulmonic Valve: The pulmonic valve was not  well visualized. Pulmonic valve regurgitation is not visualized. Aorta: No significant ascending aneurysm. Venous: The inferior vena cava is dilated in size with greater than 50% respiratory variability, suggesting right atrial pressure of 8 mmHg.  LEFT VENTRICLE PLAX 2D LVIDd:         6.70 cm   Diastology LVIDs:         6.20 cm   LV e' medial:    5.35 cm/s LV PW:         1.20 cm   LV E/e' medial:  18.4 LV IVS:        1.10 cm   LV e' lateral:   14.80 cm/s LVOT diam:     2.10 cm   LV E/e' lateral: 6.7 LV SV:         56 LV SV Index:   23 LVOT Area:     3.46 cm  RIGHT VENTRICLE             IVC RV Basal diam:  5.80 cm     IVC diam: 2.80 cm RV Mid diam:    4.10 cm RV S prime:     15.50 cm/s TAPSE (M-mode): 1.5 cm LEFT ATRIUM              Index        RIGHT ATRIUM           Index LA diam:        5.70 cm  2.31 cm/m   RA Area:     26.20 cm LA Vol (A2C):   149.0 ml 60.35 ml/m  RA Volume:   94.70 ml  38.35 ml/m LA Vol (A4C):   82.0 ml  33.21 ml/m LA Biplane Vol: 116.0 ml 46.98 ml/m  AORTIC VALVE                    PULMONIC VALVE AV Area (Vmax):    3.49 cm     PV Vmax:          0.93 m/s AV Area (Vmean):   3.06 cm     PV Peak grad:     3.5 mmHg AV Area (VTI):     2.57 cm     PR End Diast Vel: 11.56 msec AV Vmax:           116.00 cm/s AV Vmean:          97.100 cm/s AV VTI:            0.218 m AV Peak Grad:      5.4 mmHg AV Mean Grad:      4.0 mmHg LVOT Vmax:         117.00 cm/s LVOT Vmean:  85.900 cm/s LVOT VTI:          0.162 m LVOT/AV VTI ratio: 0.74  AORTA Ao Root diam: 3.60 cm Ao Asc diam:  3.30 cm MITRAL VALVE               TRICUSPID VALVE MV Area (PHT): 7.90 cm    TR Peak grad:   23.0 mmHg MV Decel Time: 96 msec     TR Vmax:        240.00 cm/s MR Peak grad: 58.4 mmHg MR Mean grad: 41.0 mmHg    SHUNTS MR Vmax:      382.00 cm/s  Systemic VTI:  0.16 m MR Vmean:     311.0 cm/s   Systemic Diam: 2.10 cm MV E velocity: 98.50 cm/s MV A velocity: 52.50 cm/s MV E/A ratio:  1.88 [place] signed by  Carolan Clines Signature Date/Time: 06/19/2021/3:02:05 PM    Final     CARDIAC DATABASE: EKG: 06/18/2021: Sinus tachycardia, 119 bpm, biatrial enlargement, right axis deviation, incomplete right bundle branch block.   06/19/2021: Sinus tachycardia 158 bpm, poor R wave progression, ST-T changes in the anterior lateral leads cannot rule out ischemia versus rate related changes, prolonged QT interval.   Echocardiogram: 06/19/2021: LVEF <20%, severely reduced systolic function, global hypokinesis, left ventricular size is dilated, right ventricular size is dilated, systolic function reduced, biatrial enlargement, mild MR, IVC dilated, estimated RAP 15 mmHg, Definity contrast was utilized LV thrombus not present (This is a personal read, it differs from the final report).   Stress Testing:  None   Heart Catheterization: Right dominant circulation, normal coronaries No angiographic evidence of coronary artery disease   RA: 16 mmHg RV: 69/11 mmHg, RVEDP 24 mmHg PA: 67/43 mmHg, mPAP 54 mmHg PCW: 27 mmHg   CO: 5.8 L/min CI: 2.3 L/min/m2   Patient went into SVT 3 times with any minimal irritation of LV during left heart catheterization, requiring vagal maneuvers, adenosine 6 mg, 12 mg, with conversion to sinus rhythm   Conclusion: Decompensated nonischemic cardiomyopathy Consider dilated or arrhythmia induced cardiomyopathy  Severe pulmonary hypertension, WHO grp II   GDMT for HFrEF with continued workup for etiology evaluation   Bilateral lower extremity venous duplex: 06/19/2021 RIGHT:  - There is no evidence of deep vein thrombosis in the lower extremity.     - No cystic structure found in the popliteal fossa.     LEFT:  - Findings consistent with age indeterminate deep vein thrombosis  involving the left gastrocnemius veins.     - No cystic structure found in the popliteal fossa.   Scheduled Meds:  apixaban  10 mg Oral BID   Followed by   Melene Muller ON 06/27/2021] apixaban  5 mg Oral  BID   atorvastatin  20 mg Oral QHS   dapagliflozin propanediol  10 mg Oral Daily   furosemide  40 mg Intravenous TID   insulin aspart  0-5 Units Subcutaneous QHS   insulin aspart  0-9 Units Subcutaneous TID WC   ivabradine  5 mg Oral BID WC   LORazepam  1 mg Intravenous Once   metoprolol tartrate  5 mg Intravenous Once   sodium chloride flush  3 mL Intravenous Q12H   sodium chloride flush  3 mL Intravenous Q12H   spironolactone  25 mg Oral q AM    Continuous Infusions:  sodium chloride      PRN Meds: sodium chloride, acetaminophen, ondansetron (ZOFRAN) IV, sodium chloride flush   IMPRESSION & RECOMMENDATIONS:  Jaedon Siler is a 51 y.o. African-American male whose past medical history and cardiac risk factors include: Premature ventricular contractions, newly discovered type II diabetic, newly discovered systolic and diastolic heart failure, nonischemic cardiomyopathy, obesity.  Impression: Newly discovered acute systolic and diastolic heart failure, stage C, NYHA class II/III Nonischemic cardiomyopathy Non-insulin-dependent diabetes mellitus type 2, newly discovered. Elevated troponins likely secondary to supply demand ischemia in the setting of acute heart failure and hypoxia on presentation Indeterminant age DVT left gastrocnemius vein History of PVCs.  Plan: Newly discovered acute systolic and diastolic heart failure, stage C, NYHA class II/III Improving. Underwent left and right heart catheterization yesterday.  No significant epicardial coronary artery disease.  Wedge pressure 27 mmHg and LVEDP >40 mmHg. Recommend diuresis.  Net IO Since Admission: -8,035 mL [06/21/21 0944] Increase Lasix 40 mg IV push 3 times daily Continue Farxiga Start spironolactone 25 mg p.o. daily Plan for cardiac MRI tomorrow. Respiratory panel negative.  TSH w/n normal limits.  Continue telemetry to evaluate for nonsustained ventricular tachycardia/PVCs and IF present will need to discuss  LifeVest given his young age, cardiomyopathy, elevated filling pressures, and arrhythmia with patient and wife prior to discharge. Recommend sleep study as outpatient.  Educated him on the importance of medication compliance, glycemic control, and improving modifiable risk factors.   Nonischemic cardiomyopathy: Management per above  Non-insulin-dependent diabetes mellitus type 2: Newly discovered during this hospitalization. LDL currently at goal, however since he is diabetic and underlying nonischemic cardiomyopathy we will start atorvastatin 20 mg p.o. nightly.  Left gastrocnemius vein DVT: Indeterminate Currently on anticoagulation per primary team.  Plan of care discussed with the patient, his wife over the phone, attending physician and nursing staff after morning rounds.  Requesting either social work / Sports coach to help w/ insurance coverage with regards to prescriptions.  We will likely start him on affordable GDMT to improve adherence to medical therapy and uptitrate his medications as outpatient once he has prescription coverage.  Patient's questions and concerns were addressed to his satisfaction. He voices understanding of the instructions provided during this encounter.   This note was created using a voice recognition software as a result there may be grammatical errors inadvertently enclosed that do not reflect the nature of this encounter. Every attempt is made to correct such errors.  Delilah Shan [facility]  Pager: [phone] Office: [phone] 06/21/2021, 9:44 AM

## 2021-06-21 NOTE — Progress Notes (Addendum)
Heart Failure Stewardship Pharmacist Progress Note   PCP: Pcp, No PCP-Cardiologist: None   HPI:  51 yo M with PMH of T2DM, syncope, PVCs, and heart murmur. He was last seen by cardiology in 2018. He presented to the ED on 6/11 with shortness of breath, cough, orthopnea, and bilateral LEE x 1.5 weeks. CXR on arrival with gross cardiomegaly and diffuse bilateral interstitial lung opacity (likely edema). CTA negative for PE but notable for vascular congestion. An ECHO was done on 6/12 and LVEF was <20% with mild LVH, mildly reduced RV, and mild MR. R/LHC on 6/13 with no evidence of CAD but elevated filling pressures (RA 16, PA 54, wedge 27, CO 5.8, CI 2.3). During the cath, he went into SVT 3 times requiring vagal maneuvers and adenosine with conversion to NSR. cMRI ordered for 6/15 for ongoing HF work-up.   Current HF Medications: Diuretic: furosemide 40 mg IV BID SGLT2i: Farxiga 10 mg daily Other: ivabradine 5 mg BID  Prior to admission HF Medications: None  Pertinent Lab Values: Serum creatinine 0.91, BUN 13, Potassium 4.3, Sodium 141, BNP 1295.7, Magnesium 2.1, A1c 7.8  Vital Signs: Weight: 282 lbs (admission weight: 290 lbs) Blood pressure: 120/80s  Heart rate: 100s - ST  I/O: -4.5L yesterday; net -8.0L  Medication Assistance / Insurance Benefits Check: Does the patient have prescription insurance?  No  Does the patient qualify for medication assistance through manufacturers or grants?   Yes Eligible grants and/or patient assistance programs: pending Medication assistance applications in progress: none  Medication assistance applications approved: none Approved medication assistance renewals will be completed by: [facility]  Outpatient Pharmacy:  Prior to admission outpatient pharmacy: [facility] Is the patient willing to use [facility] TOC pharmacy at discharge? Yes Is the patient willing to transition their outpatient pharmacy to utilize a [facility] outpatient pharmacy?    Pending   Assessment: 1. Acute systolic CHF (LVEF <20%), due to NICM on R/LHC. NYHA class III symptoms. - Continue furosemide 40 mg IV BID. Great UOP yesterday and weight down 8 lbs.  - Keep K>4 and Mag>2 - Hold off on adding BB until more euvolemic - Consider adding spironolactone 12.5 mg daily for HFrEF optimization and to help maintain K >4 - Consider adding Entresto 24/26 mg BID for HFrEF optimization  - Continue Farxiga 10 mg daily - On ivabradine 5 mg BID - continue until able to add BB   Plan: 1) Medication changes recommended at this time: - Add Entresto 24/26 mg BID or spironolactone 12.5 mg daily today  2) Patient assistance: - Patient uninsured - will need assistance for medications - Will begin patient assistance applications (once meds are added) and send off to [facility] Cardiology Pharmacist for provider completion  3)  Education  - To be completed prior to discharge  Drake Leach, PharmD, [facility] PGY2 Cardiology Pharmacy Resident

## 2021-06-22 ENCOUNTER — Inpatient Hospital Stay (HOSPITAL_COMMUNITY): Payer: Medicaid Other

## 2021-06-22 DIAGNOSIS — I5041 Acute combined systolic (congestive) and diastolic (congestive) heart failure: Secondary | ICD-10-CM

## 2021-06-22 LAB — BASIC METABOLIC PANEL
Anion gap: 13 (ref 5–15)
BUN: 16 mg/dL (ref 6–20)
CO2: 27 mmol/L (ref 22–32)
Calcium: 8.2 mg/dL — ABNORMAL LOW (ref 8.9–10.3)
Chloride: 97 mmol/L — ABNORMAL LOW (ref 98–111)
Creatinine, Ser: 0.82 mg/dL (ref 0.61–1.24)
GFR, Estimated: >60 mL/min (ref >60)
Glucose, Bld: 134 mg/dL — ABNORMAL HIGH (ref 70–99)
Potassium: 3.8 mmol/L (ref 3.5–5.1)
Sodium: 137 mmol/L (ref 135–145)

## 2021-06-22 LAB — GLUCOSE, CAPILLARY
Glucose-Capillary: 152 mg/dL — ABNORMAL HIGH (ref 70–99)
Glucose-Capillary: 253 mg/dL — ABNORMAL HIGH (ref 70–99)
Glucose-Capillary: 164 mg/dL — ABNORMAL HIGH (ref 70–99)
Glucose-Capillary: 173 mg/dL — ABNORMAL HIGH (ref 70–99)

## 2021-06-22 LAB — LIPOPROTEIN A (LPA): Lipoprotein (a): 12.8 nmol/L (ref <75.0)

## 2021-06-22 LAB — MAGNESIUM: Magnesium: 2.3 mg/dL (ref 1.7–2.4)

## 2021-06-22 LAB — BRAIN NATRIURETIC PEPTIDE: B Natriuretic Peptide: 569.5 pg/mL — ABNORMAL HIGH (ref 0.0–100.0)

## 2021-06-22 MED ORDER — METOLAZONE 2.5 MG PO TABS
2.5000 mg | ORAL_TABLET | Freq: Every day | ORAL | Status: DC
  Administered 2021-06-22: 2.5 mg via ORAL
  Filled 2021-06-22: qty 1

## 2021-06-22 MED ORDER — GADOBUTROL 1 MMOL/ML IV SOLN
10.0000 mL | Freq: Once | INTRAVENOUS | Status: AC | PRN
  Administered 2021-06-22: 10 mL via INTRAVENOUS

## 2021-06-22 NOTE — Progress Notes (Signed)
Mobility Specialist Progress Note    06/22/21 1442  Mobility  Activity Ambulated independently in hallway  Level of Assistance Independent  Assistive Device None  Distance Ambulated (ft) 420 ft  Activity Response Tolerated well  $Mobility charge 1 Mobility   Pt received in doorway and agreeable. No complaints. Continued to walk with family.   [place] Mobility Specialist

## 2021-06-22 NOTE — Progress Notes (Signed)
PROGRESS NOTE  Jerry Saunders  KPT:[id number] DOB: 05-24-1970 DOA: 06/18/2021 PCP: Pcp, No   Brief Narrative: Patient is a 51 year old male with past medical history of diabetes type 2 not on medication, PVC, cardiac murmur last evaluated by cardiology at [facility], uninsured who presented with complaints of worsening shortness of breath, cough, orthopnea, bilateral lower extremity edema.  Echocardiogram done here showed EF of less than 20%.  Underwent ischemic work-up by cardiac cath which did not show any significant coronary artery disease.  Patient currently being managed for decompensated nonischemic cardiomyopathy.  Cardiology following.  On IV diuresis for volume overload, planning for cardiac MRI.   Assessment & Plan:  Principal Problem:   Acute combined systolic and diastolic congestive heart failure (HCC) Active Problems:   CHF (congestive heart failure) (HCC)   Acute respiratory failure with hypoxia (HCC)   Elevated d-dimer   Type 2 diabetes mellitus (HCC)   Normocytic anemia   DVT (deep venous thrombosis) (HCC)   Acute combined systolic/diastolic congestive heart failure/nonischemic cardiomyopathy: Presented with shortness of breath, cough, orthopnea, bilateral lower EXTR edema.  Found to be volume overloaded on presentation.  BNP elevated.  Echo done here showed EF of less than 20%, diastolic dysfunction, reduced right ventricular function.  Underwent ischemic work-up by cardiac cath without finding of coronary artery disease.  Started on [place]. Cardiology following.  Unclear etiology for cardiomyopathy.Planning for cardiac MRI.  Continue IV diuresis, still looks volume overloaded..  Continue to monitor daily weight, input/output. LDL of 48  Acute hypoxic respiratory failure: Currently requiring 3 to 4 L of oxygen per minute.  Not on oxygen at home.  This is secondary to volume overload from severe congestive heart failure.  Continue Lasix at current  dose.  He might qualify for oxygen at on discharge, we will do oxygen qualifying test when appropriate  Diabetes type 2: Continue current insulin regimen.  Monitor blood sugars.  Hemoglobin A1c of  7.8  Left gastrinomas vein DVT, indeterminate: D-dimer was elevated.  CT PE protocol was negative for PE.  Initially started on heparin drip,  transitioned to Eliquis.  Elevated LFTs: Likely from hepatic congestion.  Resolved  Elevated troponin: Denies any chest pain.  This is secondary to severe congestive heart failure.  Normocytic anemia: Currently hemoglobin stable  Suspected OSA: He shd undergo sleep study as an outpatient.  Does not use CPAP yet  Uninsured: TOC following.  Needs assistance with medications.          DVT prophylaxis:SCD's Start: 06/20/21 1422 apixaban (ELIQUIS) tablet 10 mg  apixaban (ELIQUIS) tablet 5 mg     Code Status: Full Code  Family Communication: wife on phone on 6/15  Patient status:Inpatient  Patient is from :Home  Anticipated discharge TZ:GYFV  Estimated DC date: After cardiology clearance   Consultants: Cardiology  Procedures:Cardiac cath  Antimicrobials:  Anti-infectives (From admission, onward)    None       Subjective: Patient seen and examined at the bedside this morning.  Hemodynamically stable during my evaluation.  He was sleeping and snoring when I arrived to the room.  EKG monitor showed oxygen saturation in the range of 70s.  His saturation improved after he woke up and started talking/coughing.  He still appears volume overloaded.  objective: Vitals:   06/21/21 1946 06/21/21 2307 06/22/21 0329 06/22/21 0808  BP: 103/70 105/77 105/80 102/78  Pulse:    94  Resp: 20  20 20   Temp: 98.3 F (36.8 C) 98.3 F (36.8 C)  98.2 F (36.8 C) 98.4 F (36.9 C)  TempSrc: Oral Oral Oral Oral  SpO2:  92%  92%  Weight:      Height:        Intake/Output Summary (Last 24 hours) at 06/22/2021 1107 Last data filed at 06/22/2021  1696 Gross per 24 hour  Intake 1080 ml  Output 3975 ml  Net -2895 ml   Filed Weights   06/20/21 0500 06/20/21 1153 06/21/21 0310  Weight: 131 kg 131 kg (P) 128.1 kg    Examination:  General exam: Overall comfortable, not in distress, morbidly obese HEENT: PERRL Respiratory system:  no wheezes or crackles  Cardiovascular system: S1 & S2 heard, RRR.  Gastrointestinal system: Abdomen is nondistended, soft and nontender. Central nervous system: Alert and oriented Extremities: trace bilateral lower extremity edema, no clubbing ,no cyanosis Skin: No rashes, no ulcers,no icterus     Data Reviewed: I have personally reviewed following labs and imaging studies  CBC: Recent Labs  Lab 06/18/21 1735 06/19/21 0426 06/20/21 0407 06/20/21 1338 06/20/21 1339 06/20/21 1346 06/21/21 0209  WBC 7.8 9.3 8.1  --   --   --  8.6  NEUTROABS 4.5  --   --   --   --   --   --   HGB 12.6* 12.3* 12.2* 13.6 13.3 12.9* 12.0*  HCT 40.5 39.4 38.7* 40.0 39.0 38.0* 38.7*  MCV 89.2 88.7 87.4  --   --   --  87.8  PLT 368 341 311  --   --   --  339   Basic Metabolic Panel: Recent Labs  Lab 06/18/21 1735 06/19/21 0426 06/20/21 0407 06/20/21 1338 06/20/21 1339 06/20/21 1346 06/21/21 0209 06/22/21 0123  NA 138 139 141 143 142 143 141 137  K 4.1 4.1 3.6 3.7 3.6 3.5 4.3 3.8  CL 103 103 103  --   --   --  102 97*  CO2 27 28 29   --   --   --  28 27  GLUCOSE 142* 192* 138*  --   --   --  120* 134*  BUN 16 14 14   --   --   --  13 16  CREATININE 0.95 0.93 0.78  --   --   --  0.91 0.82  CALCIUM 8.7* 8.4* 8.2*  --   --   --  8.6* 8.2*  MG  --   --  2.1  --   --   --   --   --      Recent Results (from the past 240 hour(s))  MRSA Next Gen by PCR, Nasal     Status: None   Collection Time: 06/19/21  9:24 PM   Specimen: Nasal Mucosa; Nasal Swab  Result Value Ref Range Status   MRSA by PCR Next Gen NOT DETECTED NOT DETECTED Final    Comment: (NOTE) The GeneXpert MRSA Assay (FDA approved for NASAL  specimens only), is one component of a comprehensive MRSA colonization surveillance program. It is not intended to diagnose MRSA infection nor to guide or monitor treatment for MRSA infections. Test performance is not FDA approved in patients less than 84 years old. Performed at [facility], [address] [place], [address] [place]   Respiratory (~20 pathogens) panel by PCR     Status: None   Collection Time: 06/20/21  6:42 PM   Specimen: Nasopharyngeal Swab; Respiratory  Result Value Ref Range Status   Adenovirus NOT DETECTED NOT DETECTED  Final   Coronavirus 229E NOT DETECTED NOT DETECTED Final    Comment: (NOTE) The Coronavirus on the Respiratory Panel, DOES NOT test for the novel  Coronavirus (2019 nCoV)    Coronavirus HKU1 NOT DETECTED NOT DETECTED Final   Coronavirus NL63 NOT DETECTED NOT DETECTED Final   Coronavirus OC43 NOT DETECTED NOT DETECTED Final   Metapneumovirus NOT DETECTED NOT DETECTED Final   Rhinovirus / Enterovirus NOT DETECTED NOT DETECTED Final   Influenza A NOT DETECTED NOT DETECTED Final   Influenza B NOT DETECTED NOT DETECTED Final   Parainfluenza Virus 1 NOT DETECTED NOT DETECTED Final   Parainfluenza Virus 2 NOT DETECTED NOT DETECTED Final   Parainfluenza Virus 3 NOT DETECTED NOT DETECTED Final   Parainfluenza Virus 4 NOT DETECTED NOT DETECTED Final   Respiratory Syncytial Virus NOT DETECTED NOT DETECTED Final   Bordetella pertussis NOT DETECTED NOT DETECTED Final   Bordetella Parapertussis NOT DETECTED NOT DETECTED Final   Chlamydophila pneumoniae NOT DETECTED NOT DETECTED Final   Mycoplasma pneumoniae NOT DETECTED NOT DETECTED Final    Comment: Performed at [facility], [address], [place], [place] [address]     Radiology Studies: CARDIAC CATHETERIZATION  Result Date: 06/20/2021 Images from the original result were not included. Right dominant circulation, normal coronaries No angiographic evidence of coronary artery disease RA:  16 mmHg RV: 69/11 mmHg, RVEDP 24 mmHg PA: 67/43 mmHg, mPAP 54 mmHg PCW: 27 mmHg CO: 5.8 L/min CI: 2.3 L/min/m2 Patient went into SVT 3 times with any minimal irritation of LV during left heart catheterization, requiring vagal maneuvers, adenosine 6 mg, 12 mg, with conversion to sinus rhythm Conclusion: Decompensated nonischemic cardiomyopathy Consider dilated or arrhythmia induced cardiomyopathy Severe pulmonary hypertension, WHO grp II GDMT for HFrEF with continued workup for etiology evaluation Elder Negus, MD Pager: [phone] Office: [phone]   Scheduled Meds:  apixaban  10 mg Oral BID   Followed by   Melene Muller ON 06/27/2021] apixaban  5 mg Oral BID   atorvastatin  20 mg Oral QHS   dapagliflozin propanediol  10 mg Oral Daily   furosemide  40 mg Intravenous TID   insulin aspart  0-5 Units Subcutaneous QHS   insulin aspart  0-9 Units Subcutaneous TID WC   ivabradine  5 mg Oral BID WC   metolazone  2.5 mg Oral Daily   metoprolol tartrate  5 mg Intravenous Once   sodium chloride flush  3 mL Intravenous Q12H   sodium chloride flush  3 mL Intravenous Q12H   spironolactone  25 mg Oral q AM   Continuous Infusions:  sodium chloride       LOS: 4 days   Burnadette Pop, MD [facility] P6/15/2023, 11:07 AM

## 2021-06-22 NOTE — Progress Notes (Addendum)
Heart Failure Stewardship Pharmacist Progress Note   PCP: Pcp, No PCP-Cardiologist: None   HPI:  51 yo M with PMH of T2DM, syncope, PVCs, and heart murmur. He was last seen by cardiology in 2018. He presented to the ED on 6/11 with shortness of breath, cough, orthopnea, and bilateral LEE x 1.5 weeks. CXR on arrival with gross cardiomegaly and diffuse bilateral interstitial lung opacity (likely edema). CTA negative for PE but notable for vascular congestion. An ECHO was done on 6/12 and LVEF was <20% with mild LVH, mildly reduced RV, and mild MR. R/LHC on 6/13 with no evidence of CAD but elevated filling pressures (RA 16, PA 54, wedge 27, CO 5.8, CI 2.3). During the cath, he went into SVT 3 times requiring vagal maneuvers and adenosine with conversion to NSR.   cMRI ordered for 6/15 for ongoing HF work-up.   Current HF Medications: Diuretic: furosemide 40 mg IV TID Aldosterone Antagonist: spironolactone 25 mg daily SGLT2i: Farxiga 10 mg daily Other: ivabradine 5 mg BID  Prior to admission HF Medications: None  Pertinent Lab Values: Serum creatinine 0.82, BUN 16, Potassium 3.8, Sodium 137, BNP 1295.7>>569.5, Magnesium 2.1, A1c 7.8  Vital Signs: Weight: 282 lbs (admission weight: 290 lbs) Blood pressure: 102/78 Heart rate: 98-110 - ST  I/O: -3.1 L yesterday; net -11.8 L  Medication Assistance / Insurance Benefits Check: Does the patient have prescription insurance?  No  Does the patient qualify for medication assistance through manufacturers or grants?   Yes Eligible grants and/or patient assistance programs: pending Medication assistance applications in progress: none  Medication assistance applications approved: none Approved medication assistance renewals will be completed by: [facility]  Outpatient Pharmacy:  Prior to admission outpatient pharmacy: [facility] Is the patient willing to use [facility] TOC pharmacy at discharge? Yes Is the patient willing to transition their  outpatient pharmacy to utilize a [facility] outpatient pharmacy?   Pending   Assessment: 1. Acute systolic CHF (LVEF <20%), due to NICM on R/LHC. NYHA class III symptoms. - Continue furosemide 40 mg IV TID. Great UOP yesterday, weight not documented today - Keep K>4 and Mag>2 - Hold off on adding BB until more euvolemic - Agree with addition of spironolactone 25 mg daily - BP too low for ACE/ARB/ARNI right now, continue to monitor - Continue Farxiga 10 mg daily - On ivabradine 5 mg BID - continue until able to add BB -Await cMRI results for additional medication changes   Plan: 1) Medication changes recommended at this time: - No changes  2) Patient assistance: - Patient uninsured - will need assistance for medications -Sherryll Burger and Farxiga applications completed 06/22/2021 and sent off to [facility] Cardiology Pharmacist for provider completion  3)  Education  - Patient has been educated on current HF medications and potential additions to HF medication regimen - Patient verbalizes understanding that over the next few months, these medication doses may change and more medications may be added to optimize HF regimen - Patient has been educated on basic disease state pathophysiology and goals of therapy  Drake Leach, PharmD, BCPS PGY2 Cardiology Pharmacy Resident

## 2021-06-22 NOTE — Plan of Care (Signed)
  Problem: Fluid Volume: Goal: Ability to maintain a balanced intake and output will improve Outcome: Progressing   Problem: Health Behavior/Discharge Planning: Goal: Ability to identify and utilize available resources and services will improve Outcome: Progressing   Problem: Metabolic: Goal: Ability to maintain appropriate glucose levels will improve Outcome: Progressing   Problem: Nutritional: Goal: Maintenance of adequate nutrition will improve Outcome: Progressing   Problem: Tissue Perfusion: Goal: Adequacy of tissue perfusion will improve Outcome: Progressing   Problem: Health Behavior/Discharge Planning: Goal: Ability to manage health-related needs will improve Outcome: Progressing   Problem: Clinical Measurements: Goal: Respiratory complications will improve Outcome: Progressing   Problem: Activity: Goal: Risk for activity intolerance will decrease Outcome: Progressing   Problem: Nutrition: Goal: Adequate nutrition will be maintained Outcome: Progressing   Problem: Coping: Goal: Level of anxiety will decrease Outcome: Progressing   Problem: Elimination: Goal: Will not experience complications related to bowel motility Outcome: Progressing Goal: Will not experience complications related to urinary retention Outcome: Progressing   Problem: Pain Managment: Goal: General experience of comfort will improve Outcome: Progressing   Problem: Safety: Goal: Ability to remain free from injury will improve Outcome: Progressing   Problem: Activity: Goal: Capacity to carry out activities will improve Outcome: Progressing   Problem: Cardiovascular: Goal: Vascular access site(s) Level 0-1 will be maintained Outcome: Progressing

## 2021-06-22 NOTE — Progress Notes (Signed)
Progress Note  Patient Name: Jerry Saunders Date of Encounter: 06/22/2021  Attending physician: Burnadette Pop, MD Primary care provider: Pcp, No  Subjective: Jerry Saunders is a 51 y.o. male who was seen and examined at bedside  Denies any anginal discomfort. Shortness of breath and lower extremity swelling improving CMRI done this morning - results pending.  Case discussed and reviewed with his nurse.  Objective: Vital Signs in the last 24 hours: Temp:  [98.2 F (36.8 C)-98.6 F (37 C)] 98.6 F (37 C) (06/15 1638) Pulse Rate:  [94-102] 100 (06/15 1638) Resp:  [18-20] 18 (06/15 1638) BP: (102-111)/(64-96) 111/96 (06/15 1638) SpO2:  [91 %-94 %] 94 % (06/15 1638)  Intake/Output:  Intake/Output Summary (Last 24 hours) at 06/22/2021 1720 Last data filed at 06/22/2021 1700 Gross per 24 hour  Intake 1320 ml  Output 3700 ml  Net -2380 ml    Net IO Since Admission: -11,535 mL [06/22/21 1720]  Weights:  Filed Weights   06/20/21 0500 06/20/21 1153 06/21/21 0310  Weight: 131 kg 131 kg (P) 128.1 kg    Telemetry: Personally reviewed.  Predominately normal sinus rhythm with rare NSVT  Physical examination: PHYSICAL EXAM:    06/22/2021    4:38 PM 06/22/2021   11:15 AM 06/22/2021    8:08 AM  Vitals with BMI  Systolic 111 109 329  Diastolic 96 64 78  Pulse 100 102 94    CONSTITUTIONAL: Well-developed and well-nourished. No acute distress.  SKIN: Skin is warm and dry. No rash noted. No cyanosis. No pallor. No jaundice HEAD: Normocephalic and atraumatic.  EYES: No scleral icterus MOUTH/THROAT: Moist oral membranes.  NECK: JVD present. No thyromegaly noted. No carotid bruits  CHEST Normal respiratory effort. No intercostal retractions  LUNGS: Clear to auscultation bilaterally.  No stridor. No wheezes. No rales.  CARDIOVASCULAR: Regular rate and rhythm, positive S1-S2, no murmurs rubs or gallops appreciated. ABDOMINAL: Obese, soft, nontender, nondistended, positive bowel  sounds in all 4 quadrants, no apparent ascites.  EXTREMITIES: No pitting edema, warm to touch, 2+ bilateral DP and PT pulses HEMATOLOGIC: No significant bruising NEUROLOGIC: Oriented to person, place, and time. Nonfocal. Normal muscle tone.  PSYCHIATRIC: Normal mood and affect. Normal behavior. Cooperative  Lab Results: Hematology Recent Labs  Lab 06/19/21 0426 06/20/21 0407 06/20/21 1338 06/20/21 1339 06/20/21 1346 06/21/21 0209  WBC 9.3 8.1  --   --   --  8.6  RBC 4.44 4.43  --   --   --  4.41  HGB 12.3* 12.2*   < > 13.3 12.9* 12.0*  HCT 39.4 38.7*   < > 39.0 38.0* 38.7*  MCV 88.7 87.4  --   --   --  87.8  MCH 27.7 27.5  --   --   --  27.2  MCHC 31.2 31.5  --   --   --  31.0  RDW 14.2 14.2  --   --   --  14.3  PLT 341 311  --   --   --  339   < > = values in this interval not displayed.    Chemistry Recent Labs  Lab 06/18/21 1735 06/19/21 0426 06/20/21 0407 06/20/21 1338 06/20/21 1346 06/21/21 0209 06/22/21 0123  NA 138 139 141   < > 143 141 137  K 4.1 4.1 3.6   < > 3.5 4.3 3.8  CL 103 103 103  --   --  102 97*  CO2 27 28 29   --   --  28 27  GLUCOSE 142* 192* 138*  --   --  120* 134*  BUN 16 14 14   --   --  13 16  CREATININE 0.95 0.93 0.78  --   --  0.91 0.82  CALCIUM 8.7* 8.4* 8.2*  --   --  8.6* 8.2*  PROT 7.0 6.5  --   --   --  6.7  --   ALBUMIN 3.5 3.3*  --   --   --  3.2*  --   AST 24 21  --   --   --  22  --   ALT 53* 48*  --   --   --  34  --   ALKPHOS 81 78  --   --   --  71  --   BILITOT 1.2 1.0  --   --   --  1.3*  --   GFRNONAA >60 >60 >60  --   --  >60 >60  ANIONGAP 8 8 9   --   --  11 13   < > = values in this interval not displayed.     Cardiac Enzymes: Cardiac Panel (last 3 results) No results for input(s): "CKTOTAL", "CKMB", "TROPONINIHS", "RELINDX" in the last 72 hours.   BNP (last 3 results) Recent Labs    06/18/21 1736 06/22/21 0123  BNP 1,295.7* 569.5*    ProBNP (last 3 results) No results for input(s): "PROBNP" in the last  8760 hours.   DDimer  Recent Labs  Lab 06/18/21 1735  DDIMER 2.53*     Hemoglobin A1c:  Lab Results  Component Value Date   HGBA1C 7.8 (H) 06/19/2021   MPG 177.16 06/19/2021    TSH  Recent Labs    06/19/21 0045  TSH 1.260    Lipid Panel     Component Value Date/Time   CHOL 78 06/21/2021 0209   TRIG 39 06/21/2021 0209   HDL 22 (L) 06/21/2021 0209   CHOLHDL 3.5 06/21/2021 0209   VLDL 8 06/21/2021 0209   LDLCALC 48 06/21/2021 0209   LDLDIRECT 51.4 06/21/2021 0209    Imaging: No results found.  CARDIAC DATABASE: EKG: 06/18/2021: Sinus tachycardia, 119 bpm, biatrial enlargement, right axis deviation, incomplete right bundle branch block.   06/19/2021: Sinus tachycardia 158 bpm, poor R wave progression, ST-T changes in the anterior lateral leads cannot rule out ischemia versus rate related changes, prolonged QT interval.   Echocardiogram: 06/19/2021: LVEF <20%, severely reduced systolic function, global hypokinesis, left ventricular size is dilated, right ventricular size is dilated, systolic function reduced, biatrial enlargement, mild MR, IVC dilated, estimated RAP 15 mmHg, Definity contrast was utilized LV thrombus not present (This is a personal read, it differs from the final report).   Stress Testing:  None   Heart Catheterization: Right dominant circulation, normal coronaries No angiographic evidence of coronary artery disease   RA: 16 mmHg RV: 69/11 mmHg, RVEDP 24 mmHg PA: 67/43 mmHg, mPAP 54 mmHg PCW: 27 mmHg   CO: 5.8 L/min CI: 2.3 L/min/m2   Patient went into SVT 3 times with any minimal irritation of LV during left heart catheterization, requiring vagal maneuvers, adenosine 6 mg, 12 mg, with conversion to sinus rhythm   Conclusion: Decompensated nonischemic cardiomyopathy Consider dilated or arrhythmia induced cardiomyopathy  Severe pulmonary hypertension, WHO grp II   GDMT for HFrEF with continued workup for etiology evaluation   Bilateral  lower extremity venous duplex: 06/19/2021 RIGHT:  - There is no evidence of deep vein  thrombosis in the lower extremity.     - No cystic structure found in the popliteal fossa.     LEFT:  - Findings consistent with age indeterminate deep vein thrombosis  involving the left gastrocnemius veins.     - No cystic structure found in the popliteal fossa.   Scheduled Meds:  apixaban  10 mg Oral BID   Followed by   Melene Muller ON 06/27/2021] apixaban  5 mg Oral BID   atorvastatin  20 mg Oral QHS   dapagliflozin propanediol  10 mg Oral Daily   furosemide  40 mg Intravenous TID   insulin aspart  0-5 Units Subcutaneous QHS   insulin aspart  0-9 Units Subcutaneous TID WC   ivabradine  5 mg Oral BID WC   metolazone  2.5 mg Oral Daily   metoprolol tartrate  5 mg Intravenous Once   sodium chloride flush  3 mL Intravenous Q12H   sodium chloride flush  3 mL Intravenous Q12H   spironolactone  25 mg Oral q AM    Continuous Infusions:  sodium chloride      PRN Meds: sodium chloride, acetaminophen, guaiFENesin-dextromethorphan, ondansetron (ZOFRAN) IV, mouth rinse, sodium chloride flush   IMPRESSION & RECOMMENDATIONS: Jerry Saunders is a 51 y.o. African-American male whose past medical history and cardiac risk factors include: Premature ventricular contractions, newly discovered type II diabetic, newly discovered systolic and diastolic heart failure, nonischemic cardiomyopathy, obesity.  Impression: Newly discovered acute systolic and diastolic heart failure, stage C, NYHA class II/III Nonischemic cardiomyopathy Non-insulin-dependent diabetes mellitus type 2, newly discovered. Elevated troponins likely secondary to supply demand ischemia in the setting of acute heart failure and hypoxia on presentation Indeterminant age DVT left gastrocnemius vein History of PVCs.  Plan: Newly discovered acute systolic and diastolic heart failure, stage C, NYHA class II/III Improving. Underwent left and right  heart catheterization on 06/20/2021.  No significant epicardial coronary artery disease.  Wedge pressure 27 mmHg and LVEDP >40 mmHg. Recommend diuresis.  Net IO Since Admission: -11,535 mL [06/22/21 1720] Cont Lasix 40 mg IV push 3 times daily Add metolazone 2.5 mg p.o. daily Continue Farxiga Continue spironolactone 25 mg p.o. daily. Based on his blood pressures would recommend starting losartan. Results of his cardiac MRI pending. Respiratory panel negative.  TSH w/n normal limits.  Telemetry notes episodes of rare episodes of nonsustained ventricular tachycardia, he had SVT requiring adenosine during his cath, and has hx of PVCs. Therefore, I has discussed with both the patient and wife the concept of LifeVest for the first 90days or so for primary prevention of sudden cardiac death. He will need to have re-evaluation of his LVEF 90day after being optimized on medical therapy. They would like time to think about it.  Recommend sleep study as outpatient.  Educated him on the importance of medication compliance, glycemic control, and improving modifiable risk factors.  Requesting either social work / Sports coach to help w/ insurance coverage with regards to prescriptions.  We will likely start him on affordable GDMT to improve adherence to medical therapy and uptitrate his medications as outpatient once he has prescription coverage.  Nonischemic cardiomyopathy: Management per above  Non-insulin-dependent diabetes mellitus type 2: Newly discovered during this hospitalization. LDL currently at goal, however since he is diabetic and underlying nonischemic cardiomyopathy we will start atorvastatin 20 mg p.o. nightly.  Left gastrocnemius vein DVT: Indeterminate Currently on anticoagulation per primary team.  Plan of care discussed with the patient and his wife over the phone,  nursing  staff after morning rounds.  Patient's questions and concerns were addressed to his satisfaction. He voices  understanding of the instructions provided during this encounter.   This note was created using a voice recognition software as a result there may be grammatical errors inadvertently enclosed that do not reflect the nature of this encounter. Every attempt is made to correct such errors.  Mechele Claude [facility]  Pager: [phone] Office: [phone] 06/22/2021, 5:20 PM

## 2021-06-23 ENCOUNTER — Other Ambulatory Visit (HOSPITAL_COMMUNITY): Payer: Self-pay

## 2021-06-23 LAB — BASIC METABOLIC PANEL
Anion gap: 15 (ref 5–15)
BUN: 16 mg/dL (ref 6–20)
CO2: 29 mmol/L (ref 22–32)
Calcium: 8.7 mg/dL — ABNORMAL LOW (ref 8.9–10.3)
Chloride: 93 mmol/L — ABNORMAL LOW (ref 98–111)
Creatinine, Ser: 0.89 mg/dL (ref 0.61–1.24)
GFR, Estimated: >60 mL/min (ref >60)
Glucose, Bld: 138 mg/dL — ABNORMAL HIGH (ref 70–99)
Potassium: 3.5 mmol/L (ref 3.5–5.1)
Sodium: 137 mmol/L (ref 135–145)

## 2021-06-23 LAB — GLUCOSE, CAPILLARY
Glucose-Capillary: 147 mg/dL — ABNORMAL HIGH (ref 70–99)
Glucose-Capillary: 193 mg/dL — ABNORMAL HIGH (ref 70–99)

## 2021-06-23 LAB — BRAIN NATRIURETIC PEPTIDE: B Natriuretic Peptide: 265.8 pg/mL — ABNORMAL HIGH (ref 0.0–100.0)

## 2021-06-23 MED ORDER — TORSEMIDE 20 MG PO TABS
10.0000 mg | ORAL_TABLET | Freq: Every day | ORAL | Status: DC
  Administered 2021-06-23: 10 mg via ORAL
  Filled 2021-06-23: qty 1

## 2021-06-23 MED ORDER — LOSARTAN POTASSIUM 25 MG PO TABS
25.0000 mg | ORAL_TABLET | Freq: Every day | ORAL | 3 refills | Status: DC
  Filled 2021-06-23 (×2): qty 30, 30d supply, fill #0

## 2021-06-23 MED ORDER — APIXABAN 5 MG PO TABS
5.0000 mg | ORAL_TABLET | Freq: Two times a day (BID) | ORAL | 2 refills | Status: DC
  Filled 2021-06-23: qty 60, 30d supply, fill #0

## 2021-06-23 MED ORDER — APIXABAN 5 MG PO TABS
10.0000 mg | ORAL_TABLET | Freq: Two times a day (BID) | ORAL | 0 refills | Status: DC
  Filled 2021-06-23: qty 74, 30d supply, fill #0

## 2021-06-23 MED ORDER — POTASSIUM CHLORIDE CRYS ER 20 MEQ PO TBCR
40.0000 meq | EXTENDED_RELEASE_TABLET | Freq: Once | ORAL | Status: AC
  Administered 2021-06-23: 40 meq via ORAL
  Filled 2021-06-23: qty 2

## 2021-06-23 MED ORDER — ATORVASTATIN CALCIUM 20 MG PO TABS
20.0000 mg | ORAL_TABLET | Freq: Every day | ORAL | 3 refills | Status: DC
  Filled 2021-06-23 (×2): qty 30, 30d supply, fill #0

## 2021-06-23 MED ORDER — DAPAGLIFLOZIN PROPANEDIOL 10 MG PO TABS
10.0000 mg | ORAL_TABLET | Freq: Every day | ORAL | 3 refills | Status: DC
  Filled 2021-06-23: qty 30, 30d supply, fill #0

## 2021-06-23 MED ORDER — METFORMIN HCL 500 MG PO TABS
500.0000 mg | ORAL_TABLET | Freq: Two times a day (BID) | ORAL | 3 refills | Status: DC
Start: 2021-06-23 — End: 2022-01-18
  Filled 2021-06-23 (×2): qty 60, 30d supply, fill #0

## 2021-06-23 MED ORDER — LOSARTAN POTASSIUM 25 MG PO TABS
25.0000 mg | ORAL_TABLET | Freq: Every day | ORAL | Status: DC
  Administered 2021-06-23: 25 mg via ORAL
  Filled 2021-06-23: qty 1

## 2021-06-23 MED ORDER — TORSEMIDE 10 MG PO TABS
10.0000 mg | ORAL_TABLET | Freq: Every day | ORAL | 3 refills | Status: DC
  Filled 2021-06-23 (×2): qty 30, 30d supply, fill #0

## 2021-06-23 MED ORDER — SPIRONOLACTONE 25 MG PO TABS
25.0000 mg | ORAL_TABLET | Freq: Every morning | ORAL | 3 refills | Status: DC
  Filled 2021-06-23 (×2): qty 30, 30d supply, fill #0

## 2021-06-23 MED ORDER — IVABRADINE HCL 5 MG PO TABS
5.0000 mg | ORAL_TABLET | Freq: Two times a day (BID) | ORAL | 3 refills | Status: DC
  Filled 2021-06-23 (×2): qty 60, 30d supply, fill #0

## 2021-06-23 NOTE — TOC Progression Note (Signed)
Transition of Care [facility]) - Progression Note    Patient Details  Name: Jerry Saunders MRN: [id number] Date of Birth: 1970-08-21  [facility] CM/SW Contact  Beckie Busing, RN Phone [phone]  06/23/2021, 11:23 AM  Clinical Narrative:    TOC consulted for PCP and medication assistance. PCP appointment has been scheduled and added to AVS. MD made aware that appointment is Aug 31, 848. AVS has been updated. Medications have been sent to [facility] pharmacy MATCH complete to provide uninsured patient with 30 day supply of medications.         Expected Discharge Plan and Services           Expected Discharge Date: 06/23/21                                     Social Determinants of Health (SDOH) Interventions    Readmission Risk Interventions     No data to display

## 2021-06-23 NOTE — Progress Notes (Addendum)
Heart Failure Stewardship Pharmacist Progress Note   PCP: Pcp, No PCP-Cardiologist: None   HPI:  51 yo M with PMH of T2DM, syncope, PVCs, and heart murmur. He was last seen by cardiology in 2018. He presented to the ED on 6/11 with shortness of breath, cough, orthopnea, and bilateral LEE x 1.5 weeks. CXR on arrival with gross cardiomegaly and diffuse bilateral interstitial lung opacity (likely edema). CTA negative for PE but notable for vascular congestion. An ECHO was done on 6/12 and LVEF was <20% with mild LVH, mildly reduced RV, and mild MR. R/LHC on 6/13 with no evidence of CAD but elevated filling pressures (RA 16, PA 54, wedge 27, CO 5.8, CI 2.3). During the cath, he went into SVT 3 times requiring vagal maneuvers and adenosine with conversion to NSR.   cMRI on 06/22/2021 with LVEF 20%, RVEF 27%, CO 9.3, CI 3.6, RV insertion site with LGE, often seen in elevated WP.   Current HF Medications: Diuretic: furosemide 40 mg IV TID + metolazone 2.5 mg daily Aldosterone Antagonist: spironolactone 25 mg daily SGLT2i: Farxiga 10 mg daily Other: ivabradine 5 mg BID  Prior to admission HF Medications: None  Pertinent Lab Values: Serum creatinine 0.89, BUN 16, Potassium 3.5, Sodium 137, BNP 1295.7>>569.5, Magnesium 2.1, A1c 7.8%  Vital Signs: Weight: 271 lbs (admission weight: 290 lbs) Blood pressure: 124/83 Heart rate: 90-100 - ST  I/O: -3.2 L yesterday; net -14.5 L  Medication Assistance / Insurance Benefits Check: Does the patient have prescription insurance?  No  Does the patient qualify for medication assistance through manufacturers or grants?   Yes Eligible grants and/or patient assistance programs: pending Medication assistance applications in progress: none  Medication assistance applications approved: none Approved medication assistance renewals will be completed by: [facility]  Outpatient Pharmacy:  Prior to admission outpatient pharmacy: [facility] Is the patient  willing to use [facility] TOC pharmacy at discharge? Yes Is the patient willing to transition their outpatient pharmacy to utilize a [facility]?   Pending   Assessment: 1. Acute systolic CHF (LVEF <20%), due to NICM on R/LHC. NYHA class III symptoms. - Continue furosemide 40 mg IV TID + metolazone 2.5 mg daily. Great UOP yesterday, weight down about 20 lbs from admit.  - Keep K>4 and Mag>2, give 40 mEq KCl - Hold off on adding BB until more euvolemic - On ivabradine 5 mg BID - continue until able to add BB - Continue spironolactone 25 mg daily - Continue Farxiga 10 mg daily -BP improved today, start losartan 25 mg daily. Plan to change to [facility] if tolerates losartan.    Plan: 1) Medication changes recommended at this time: - Start losartan 25 mg daily -Give 40 mEq KCl  2) Patient assistance: - Patient uninsured - will need assistance for medications -Sherryll Burger and Farxiga applications completed 06/22/2021 and sent off to [facility] pharmacist for provider completion -Match at discharge and 30d free Eliquis, patient can then use 30d free coupon cards for [facility] pharmacist alerted to need for help with Eliquis and ivabradine if remains on medications long term  3)  Education  - Patient has been educated on current HF medications and potential additions to HF medication regimen - Patient verbalizes understanding that over the next few months, these medication doses may change and more medications may be added to optimize HF regimen - Patient has been educated on basic disease state pathophysiology and goals of therapy  Drake Leach, PharmD, BCPS PGY2 Cardiology Pharmacy Resident

## 2021-06-23 NOTE — Progress Notes (Signed)
Progress Note  Patient Name: Jerry Saunders Date of Encounter: 06/23/2021  Attending physician: Burnadette Pop, MD Primary care provider: Pcp, No  Subjective: Jerry Saunders is a 51 y.o. male who was seen and examined at bedside  Denies any anginal discomfort, shortness of breath. Lower extremity swelling improving.  Ambulating on the floors w/o symptoms.  CMRI results reviewed w/ pt and wife.  Both patient and wife refused using Zoll LifeVest in the first 90day Case discussed and reviewed with his nurse.  Objective: Vital Signs in the last 24 hours: Temp:  [97.6 F (36.4 C)-98.6 F (37 C)] 98.3 F (36.8 C) (06/16 1112) Pulse Rate:  [80-100] 80 (06/16 1112) Resp:  [16-19] 16 (06/16 1112) BP: (99-124)/(56-96) 120/81 (06/16 1112) SpO2:  [92 %-94 %] 93 % (06/16 1112) Weight:  [123.3 kg] 123.3 kg (06/16 0346)  Intake/Output:  Intake/Output Summary (Last 24 hours) at 06/23/2021 1501 Last data filed at 06/23/2021 1300 Gross per 24 hour  Intake 600 ml  Output 3700 ml  Net -3100 ml    Net IO Since Admission: -14,875 mL [06/23/21 1501]  Weights:  Filed Weights   06/20/21 1153 06/21/21 0310 06/23/21 0346  Weight: 131 kg (P) 128.1 kg 123.3 kg    Telemetry: Personally reviewed.  Predominately normal sinus rhythm with rare NSVT.   Physical examination: PHYSICAL EXAM:    06/23/2021   11:12 AM 06/23/2021    7:36 AM 06/23/2021    3:46 AM  Vitals with BMI  Weight   271 lbs 13 oz  BMI   34.89  Systolic 120 124 696  Diastolic 81 83 56  Pulse 80 91     CONSTITUTIONAL: Well-developed and well-nourished. No acute distress.  SKIN: Skin is warm and dry. No rash noted. No cyanosis. No pallor. No jaundice HEAD: Normocephalic and atraumatic.  EYES: No scleral icterus MOUTH/THROAT: Moist oral membranes.  NECK: JVD present. No thyromegaly noted. No carotid bruits  CHEST Normal respiratory effort. No intercostal retractions  LUNGS: Clear to auscultation bilaterally.  No stridor. No  wheezes. No rales.  CARDIOVASCULAR: Regular rate and rhythm, positive S1-S2, no murmurs rubs or gallops appreciated. ABDOMINAL: Obese, soft, nontender, nondistended, positive bowel sounds in all 4 quadrants, no apparent ascites.  EXTREMITIES: No pitting edema, warm to touch, 2+ bilateral DP and PT pulses HEMATOLOGIC: No significant bruising NEUROLOGIC: Oriented to person, place, and time. Nonfocal. Normal muscle tone.  PSYCHIATRIC: Normal mood and affect. Normal behavior. Cooperative  Lab Results: Hematology Recent Labs  Lab 06/19/21 0426 06/20/21 0407 06/20/21 1338 06/20/21 1339 06/20/21 1346 06/21/21 0209  WBC 9.3 8.1  --   --   --  8.6  RBC 4.44 4.43  --   --   --  4.41  HGB 12.3* 12.2*   < > 13.3 12.9* 12.0*  HCT 39.4 38.7*   < > 39.0 38.0* 38.7*  MCV 88.7 87.4  --   --   --  87.8  MCH 27.7 27.5  --   --   --  27.2  MCHC 31.2 31.5  --   --   --  31.0  RDW 14.2 14.2  --   --   --  14.3  PLT 341 311  --   --   --  339   < > = values in this interval not displayed.    Chemistry Recent Labs  Lab 06/18/21 1735 06/19/21 0426 06/20/21 0407 06/21/21 0209 06/22/21 0123 06/23/21 0122  NA 138 139   < > 141  137 137  K 4.1 4.1   < > 4.3 3.8 3.5  CL 103 103   < > 102 97* 93*  CO2 27 28   < > 28 27 29   GLUCOSE 142* 192*   < > 120* 134* 138*  BUN 16 14   < > 13 16 16   CREATININE 0.95 0.93   < > 0.91 0.82 0.89  CALCIUM 8.7* 8.4*   < > 8.6* 8.2* 8.7*  PROT 7.0 6.5  --  6.7  --   --   ALBUMIN 3.5 3.3*  --  3.2*  --   --   AST 24 21  --  22  --   --   ALT 53* 48*  --  34  --   --   ALKPHOS 81 78  --  71  --   --   BILITOT 1.2 1.0  --  1.3*  --   --   GFRNONAA >60 >60   < > >60 >60 >60  ANIONGAP 8 8   < > 11 13 15    < > = values in this interval not displayed.     Cardiac Enzymes: Cardiac Panel (last 3 results) No results for input(s): "CKTOTAL", "CKMB", "TROPONINIHS", "RELINDX" in the last 72 hours.   BNP (last 3 results) Recent Labs    06/18/21 1736 06/22/21 0123  06/23/21 0122  BNP 1,295.7* 569.5* 265.8*    ProBNP (last 3 results) No results for input(s): "PROBNP" in the last 8760 hours.   DDimer  Recent Labs  Lab 06/18/21 1735  DDIMER 2.53*     Hemoglobin A1c:  Lab Results  Component Value Date   HGBA1C 7.8 (H) 06/19/2021   MPG 177.16 06/19/2021    TSH  Recent Labs    06/19/21 0045  TSH 1.260    Lipid Panel     Component Value Date/Time   CHOL 78 06/21/2021 0209   TRIG 39 06/21/2021 0209   HDL 22 (L) 06/21/2021 0209   CHOLHDL 3.5 06/21/2021 0209   VLDL 8 06/21/2021 0209   LDLCALC 48 06/21/2021 0209   LDLDIRECT 51.4 06/21/2021 0209    Imaging: MR CARDIAC MORPHOLOGY W WO CONTRAST  Result Date: 06/22/2021 CLINICAL DATA:  66M presents with HFrEF (EF <20%). Normal coronary arteries on cath EXAM: CARDIAC MRI TECHNIQUE: The patient was scanned on a 1.5 Tesla Siemens magnet. A dedicated cardiac coil was used. Functional imaging was done using Fiesta sequences. 2,3, and 4 chamber views were done to assess for RWMA's. Modified Simpson's rule using a short axis stack was used to calculate an ejection fraction on a dedicated work 06/23/2021. The patient received 10 cc of Gadavist. After 10 minutes inversion recovery sequences were used to assess for infiltration and scar tissue. CONTRAST:  10 cc  of Gadavist FINDINGS: Left ventricle: -Severe dilatation -Severe systolic dysfunction -Elevated ECV (36%) -RV insertion site LGE LV EF: 20% (Normal 56-78%) Absolute volumes: LV EDV: 0210 (Normal 77-195 mL) LV ESV: 06/24/2021 (Normal 19-72 mL) LV SV: 47mL (Normal 51-133 mL) CO: 9.3L/min (Normal 2.8-8.8 L/min) Indexed volumes: LV EDV: 133mL/sq-m (Normal 47-92 mL/sq-m) LV ESV: 181mL/sq-m (Normal 13-30 mL/sq-m) LV SV: 52mL/sq-m (Normal 32-62 mL/sq-m) CI: 3.6L/min/sq-m (Normal 1.7-4.2 L/min/sq-m) Right ventricle: Moderate enlargement with severe systolic dysfunction RV EF:  27% (Normal 47-74%) Absolute volumes: RV EDV: 79m (Normal 88-227  mL) RV ESV: 21m (Normal 23-103 mL) RV SV: 8mL (Normal 52-138 mL) CO: 9.5L/min (Normal 2.8-8.8 L/min) Indexed volumes: RV EDV: 133mL/sq-m (  Normal 55-105 mL/sq-m) RV ESV: 47mL/sq-m (Normal 15-43 mL/sq-m) RV SV: 45mL/sq-m (Normal 32-64 mL/sq-m) CI: 3.6L/min/sq-m (Normal 1.7-4.2 L/min/sq-m) Left atrium: Moderate enlargement Right atrium: Moderate enlargement Mitral valve: Mild regurgitation Aortic valve: Tricuspid. No regurgitation Tricuspid valve: Mild regurgitation Pulmonic valve: No regurgitation Aorta: Normal proximal ascending aorta Pulmonary artery: Dilated main PA measuring 19mm Pericardium: Normal IMPRESSION: 1.  Severe LV dilatation with severe systolic dysfunction (EF 123456) 2.  Moderate RV dilatation with severe systolic dysfunction (EF [id number]) 3. RV insertion site late gadolinium enhancement, which is a nonspecific finding often seen in setting of elevated pulmonary pressures. No other LGE seen Electronically Signed   By: Oswaldo Milian M.D.   On: 06/22/2021 23:15    CARDIAC DATABASE: EKG: 06/18/2021: Sinus tachycardia, 119 bpm, biatrial enlargement, right axis deviation, incomplete right bundle branch block.   06/19/2021: Sinus tachycardia 158 bpm, poor R wave progression, ST-T changes in the anterior lateral leads cannot rule out ischemia versus rate related changes, prolonged QT interval.   Echocardiogram: 06/19/2021: LVEF <20%, severely reduced systolic function, global hypokinesis, left ventricular size is dilated, right ventricular size is dilated, systolic function reduced, biatrial enlargement, mild MR, IVC dilated, estimated RAP 15 mmHg, Definity contrast was utilized LV thrombus not present (This is a personal read, it differs from the final report).   Stress Testing:  None   Heart Catheterization: Right dominant circulation, normal coronaries No angiographic evidence of coronary artery disease   RA: 16 mmHg RV: 69/11 mmHg, RVEDP 24 mmHg PA: 67/43 mmHg, mPAP 54 mmHg PCW: 27  mmHg   CO: 5.8 L/min CI: 2.3 L/min/m2   Patient went into SVT 3 times with any minimal irritation of LV during left heart catheterization, requiring vagal maneuvers, adenosine 6 mg, 12 mg, with conversion to sinus rhythm   Conclusion: Decompensated nonischemic cardiomyopathy Consider dilated or arrhythmia induced cardiomyopathy  Severe pulmonary hypertension, WHO grp II   GDMT for HFrEF with continued workup for etiology evaluation   Bilateral lower extremity venous duplex: 06/19/2021 RIGHT:  - There is no evidence of deep vein thrombosis in the lower extremity.     - No cystic structure found in the popliteal fossa.     LEFT:  - Findings consistent with age indeterminate deep vein thrombosis  involving the left gastrocnemius veins.     - No cystic structure found in the popliteal fossa.   Scheduled Meds:  apixaban  10 mg Oral BID   Followed by   Derrill Memo ON 06/27/2021] apixaban  5 mg Oral BID   atorvastatin  20 mg Oral QHS   dapagliflozin propanediol  10 mg Oral Daily   insulin aspart  0-5 Units Subcutaneous QHS   insulin aspart  0-9 Units Subcutaneous TID WC   ivabradine  5 mg Oral BID WC   losartan  25 mg Oral Daily   metoprolol tartrate  5 mg Intravenous Once   sodium chloride flush  3 mL Intravenous Q12H   sodium chloride flush  3 mL Intravenous Q12H   spironolactone  25 mg Oral q AM   torsemide  10 mg Oral Daily    Continuous Infusions:  sodium chloride      PRN Meds: sodium chloride, acetaminophen, guaiFENesin-dextromethorphan, ondansetron (ZOFRAN) IV, mouth rinse, sodium chloride flush   IMPRESSION & RECOMMENDATIONS: Jerry Saunders is a 51 y.o. African-American male whose past medical history and cardiac risk factors include: Premature ventricular contractions, newly discovered type II diabetic, newly discovered systolic and diastolic heart failure, nonischemic cardiomyopathy, obesity.  Impression:  Newly discovered acute systolic and diastolic heart failure,  stage C, NYHA class II Nonischemic cardiomyopathy Nonsustained ventricular tachycardia. Non-insulin-dependent diabetes mellitus type 2, newly discovered. Elevated troponins likely secondary to supply demand ischemia in the setting of acute heart failure and hypoxia on presentation Indeterminant age DVT left gastrocnemius vein History of PVCs.  Plan: Newly discovered acute systolic and diastolic heart failure, stage C, NYHA class II Improving. Underwent left and right heart catheterization on 06/20/2021.  No significant epicardial coronary artery disease.  Wedge pressure 27 mmHg and LVEDP >40 mmHg. Recommend diuresis.  Net IO Since Admission: -14,875 mL [06/23/21 1501] BNP levels on arrival 1,295 and this morning 265.  cMRI notes biventricular heart failure.  Patient and his wife both refuse Facilities manager for primary prevention of SCD during the first 90days despite knowing that his LVEF is 20%, noted to have asymptomatic episodes of NSVT on telemetry, SVT during heart catheterization requiring adenosine intervention.  Needs a sleep study as outpatient.  Change IV lasix to torsemide  Start Losartan 25mg  po qday  Continue Farxiga, atorvastatin, Corlanor, metoprolol, spironolactone  Nonischemic cardiomyopathy: Management per above  Nonsustained ventricular tachycardia: Given his severely reduced cardiomyopathy recommended LifeVest. Despite having a thorough discussion with both patient and his wife they chose to not undergo this option for now. We will continue low-dose metoprolol for now.  Non-insulin-dependent diabetes mellitus type 2: Newly discovered during this hospitalization. Glycemic control/management per primary team. Has tolerated atorvastatin 20 mg p.o. nightly well.  Left gastrocnemius vein DVT: Indeterminate Currently on anticoagulation per primary team.  Outpatient follow-up scheduled on June 23 at 1:30 PM.  Plan of care discussed with the patient and his wife over  the phone,  nursing staff after morning rounds.  Patient's questions and concerns were addressed to his satisfaction. He voices understanding of the instructions provided during this encounter.   This note was created using a voice recognition software as a result there may be grammatical errors inadvertently enclosed that do not reflect the nature of this encounter. Every attempt is made to correct such errors.  Mechele Claude [facility]  Pager: [phone] Office: [phone] 06/23/2021, 3:01 PM

## 2021-06-23 NOTE — Discharge Instructions (Signed)

## 2021-06-23 NOTE — Discharge Summary (Addendum)
Physician Discharge Summary  Jerry Saunders [id number] DOB: [birth year] DOA: 06/18/2021  PCP: Pcp, No  Admit date: 06/18/2021 Discharge date: 06/23/2021  Admitted From: Home Disposition:  Home  Discharge Condition:Stable CODE STATUS:FULL Diet recommendation: Heart Healthy   Brief/Interim Summary:  Patient is a 51 year old male with past medical history of diabetes type 2 not on medication, PVC, cardiac murmur last evaluated by cardiology at [facility], uninsured who presented with complaints of worsening shortness of breath, cough, orthopnea, bilateral lower extremity edema.  Echocardiogram done here showed EF of less than 20%.  Underwent ischemic work-up by cardiac cath which did not show any significant coronary artery disease.  Patient currently being managed for decompensated nonischemic cardiomyopathy.  Cardiology was following.  He was on IV diuresis for volume overload, now nearly euvolemic.  Started on torsemide.  Cardiology cleared for discharge.  He is medically stable for discharge home today.  He will follow-up with cardiology as an outpatient.  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  Found to be volume overloaded on presentation.  BNP elevated.  Echo done here showed EF of less than 123456, diastolic dysfunction, reduced right ventricular function.  Underwent ischemic work-up by cardiac cath without finding of coronary artery disease.  Started on [place]. Cardiology following.  Unclear etiology for cardiomyopathy.cardiac MRI showed severely diminished function of bilateral ventricles . Medications as instructed.   Acute hypoxic respiratory failure: He was requiring 3 to 4 L of oxygen per minute.  Not on oxygen at home.  This is secondary to volume overload from severe  congestive heart failure.    he did not qualify for home oxygen, maintaining saturation on ambulation, desaturated on sleep .  diabetes type 2: Started on metformin.  Hemoglobin A1c of  7.8   Left gastrinomas vein DVT, indeterminate: D-dimer was elevated.  CT PE protocol was negative for PE.  Initially started on heparin drip,  transitioned to Eliquis.   Elevated LFTs: Likely from hepatic congestion.  Resolved   Elevated troponin: Denies any chest pain.  This is secondary to severe congestive heart failure.   Normocytic anemia: Currently hemoglobin stable   Suspected OSA: He shd undergo sleep study as an outpatient.  Does not use CPAP yet.  Desaturates during sleep.   Uninsured: TOC following.  Needs assistance with medications.        Discharge Diagnoses:  Principal Problem:   Acute combined systolic and diastolic congestive heart failure (HCC) Active Problems:   CHF (congestive heart failure) (HCC)   Acute respiratory failure with hypoxia (HCC)   Elevated d-dimer   Type 2 diabetes mellitus (HCC)   Normocytic anemia   DVT (deep venous thrombosis) ([place])    Discharge Instructions  Discharge Instructions     Diet - low sodium heart healthy   Complete by: As directed    Discharge instructions   Complete by: As directed    1)Please take prescribed medications as instructed 2)You will be called by cardiology for follow-up appointment.   Increase activity slowly   Complete by: As directed       Allergies as of 06/23/2021   No Known Allergies      Medication List     STOP taking these medications    ibuprofen 200 MG tablet Commonly known as: ADVIL       TAKE these medications    apixaban 5 MG Tabs tablet Commonly  known as: ELIQUIS Take 2 tablets (10 mg total) by mouth 2 (two) times daily for 4 days. Then take 1 (one) tablet twice (two) daily.   apixaban 5 MG Tabs tablet Commonly known as: ELIQUIS Take 1 tablet (5 mg total) by mouth 2 (two) times  daily. Start taking on: June 27, 2021   atorvastatin 20 MG tablet Commonly known as: LIPITOR Take 1 tablet (20 mg total) by mouth at bedtime.   dapagliflozin propanediol 10 MG Tabs tablet Commonly known as: FARXIGA Take 1 tablet (10 mg total) by mouth daily. Start taking on: June 24, 2021   DRY EYES OP Place 2 drops into both eyes 2 (two) times daily as needed (dry eyes).   ivabradine 5 MG Tabs tablet Commonly known as: CORLANOR Take 1 tablet (5 mg total) by mouth 2 (two) times daily with a meal.   losartan 25 MG tablet Commonly known as: COZAAR Take 1 tablet (25 mg total) by mouth daily. Start taking on: June 24, 2021   metFORMIN 500 MG tablet Commonly known as: Glucophage Take 1 tablet (500 mg total) by mouth 2 (two) times daily with a meal.   spironolactone 25 MG tablet Commonly known as: ALDACTONE Take 1 tablet (25 mg total) by mouth in the morning. Start taking on: June 24, 2021   torsemide 10 MG tablet Commonly known as: DEMADEX Take 1 tablet (10 mg total) by mouth daily. Start taking on: June 24, 2021        No Known Allergies  Consultations: Cardiology   Procedures/Studies: MR CARDIAC MORPHOLOGY W WO CONTRAST  Result Date: 06/22/2021 CLINICAL DATA:  30M presents with HFrEF (EF <20%). Normal coronary arteries on cath EXAM: CARDIAC MRI TECHNIQUE: The patient was scanned on a 1.5 Tesla Siemens magnet. A dedicated cardiac coil was used. Functional imaging was done using Fiesta sequences. 2,3, and 4 chamber views were done to assess for RWMA's. Modified Simpson's rule using a short axis stack was used to calculate an ejection fraction on a dedicated work Conservation officer, nature. The patient received 10 cc of Gadavist. After 10 minutes inversion recovery sequences were used to assess for infiltration and scar tissue. CONTRAST:  10 cc  of Gadavist FINDINGS: Left ventricle: -Severe dilatation -Severe systolic dysfunction -Elevated ECV (36%) -RV insertion site  LGE LV EF: 20% (Normal 56-78%) Absolute volumes: LV EDV: 461mL (Normal 77-195 mL) LV ESV: 319mL (Normal 19-72 mL) LV SV: 76mL (Normal 51-133 mL) CO: 9.3L/min (Normal 2.8-8.8 L/min) Indexed volumes: LV EDV: 150mL/sq-m (Normal 47-92 mL/sq-m) LV ESV: 141mL/sq-m (Normal 13-30 mL/sq-m) LV SV: 25mL/sq-m (Normal 32-62 mL/sq-m) CI: 3.6L/min/sq-m (Normal 1.7-4.2 L/min/sq-m) Right ventricle: Moderate enlargement with severe systolic dysfunction RV EF:  27% (Normal 47-74%) Absolute volumes: RV EDV: 332mL (Normal 88-227 mL) RV ESV: 268mL (Normal 23-103 mL) RV SV: 42mL (Normal 52-138 mL) CO: 9.5L/min (Normal 2.8-8.8 L/min) Indexed volumes: RV EDV: 160mL/sq-m (Normal 55-105 mL/sq-m) RV ESV: 1mL/sq-m (Normal 15-43 mL/sq-m) RV SV: 64mL/sq-m (Normal 32-64 mL/sq-m) CI: 3.6L/min/sq-m (Normal 1.7-4.2 L/min/sq-m) Left atrium: Moderate enlargement Right atrium: Moderate enlargement Mitral valve: Mild regurgitation Aortic valve: Tricuspid. No regurgitation Tricuspid valve: Mild regurgitation Pulmonic valve: No regurgitation Aorta: Normal proximal ascending aorta Pulmonary artery: Dilated main PA measuring 53mm Pericardium: Normal IMPRESSION: 1.  Severe LV dilatation with severe systolic dysfunction (EF 123456) 2.  Moderate RV dilatation with severe systolic dysfunction (EF XX123456) 3. RV insertion site late gadolinium enhancement, which is a nonspecific finding often seen in setting of elevated pulmonary pressures. No other LGE  seen Electronically Signed   By: Oswaldo Milian M.D.   On: 06/22/2021 23:15   CARDIAC CATHETERIZATION  Result Date: 06/20/2021 Images from the original result were not included. Right dominant circulation, normal coronaries No angiographic evidence of coronary artery disease RA: 16 mmHg RV: 69/11 mmHg, RVEDP 24 mmHg PA: 67/43 mmHg, mPAP 54 mmHg PCW: 27 mmHg CO: 5.8 L/min CI: 2.3 L/min/m2 Patient went into SVT 3 times with any minimal irritation of LV during left heart catheterization, requiring vagal  maneuvers, adenosine 6 mg, 12 mg, with conversion to sinus rhythm Conclusion: Decompensated nonischemic cardiomyopathy Consider dilated or arrhythmia induced cardiomyopathy Severe pulmonary hypertension, WHO grp II GDMT for HFrEF with continued workup for etiology evaluation Nigel Mormon, MD Pager: [phone] Office: [phone]  VAS [place] LOWER EXTREMITY VENOUS (DVT)  Result Date: 06/19/2021  Lower Venous DVT Study Patient Name:  Jerry Saunders  Date of Exam:   06/19/2021 Medical Rec #: [id number]      Accession #:    [id number] Date of Birth: 01/17/1970      Patient Gender: M Patient Age:   33 years Exam Location:  [facility] Procedure:      VAS [place] LOWER EXTREMITY VENOUS (DVT) Referring Phys: Wandra Feinstein RATHORE --------------------------------------------------------------------------------  Indications: Edema, shortness of breath, elevated d-dimer.  Comparison Study: No prior studies. Performing Technologist: Darlin Coco RDMS, RVT  Examination Guidelines: A complete evaluation includes B-mode imaging, spectral Doppler, color Doppler, and power Doppler as needed of all accessible portions of each vessel. Bilateral testing is considered an integral part of a complete examination. Limited examinations for reoccurring indications may be performed as noted. The reflux portion of the exam is performed with the patient in reverse Trendelenburg.  +---------+---------------+---------+-----------+----------+--------------+ RIGHT    CompressibilityPhasicitySpontaneityPropertiesThrombus Aging +---------+---------------+---------+-----------+----------+--------------+ CFV      Full           No       Yes                                 +---------+---------------+---------+-----------+----------+--------------+ SFJ      Full                                                        +---------+---------------+---------+-----------+----------+--------------+ FV Prox  Full                                                         +---------+---------------+---------+-----------+----------+--------------+ FV Mid   Full                                                        +---------+---------------+---------+-----------+----------+--------------+ FV DistalFull                                                        +---------+---------------+---------+-----------+----------+--------------+  PFV      Full                                                        +---------+---------------+---------+-----------+----------+--------------+ POP      Full           No       Yes                                 +---------+---------------+---------+-----------+----------+--------------+ PTV      Full                                                        +---------+---------------+---------+-----------+----------+--------------+ PERO     Full                                                        +---------+---------------+---------+-----------+----------+--------------+ Gastroc  Full                                                        +---------+---------------+---------+-----------+----------+--------------+   +---------+---------------+---------+-----------+----------+-----------------+ LEFT     CompressibilityPhasicitySpontaneityPropertiesThrombus Aging    +---------+---------------+---------+-----------+----------+-----------------+ CFV      Full           No       Yes                                    +---------+---------------+---------+-----------+----------+-----------------+ SFJ      Full                                                           +---------+---------------+---------+-----------+----------+-----------------+ FV Prox  Full                                                           +---------+---------------+---------+-----------+----------+-----------------+ FV Mid   Full                                                            +---------+---------------+---------+-----------+----------+-----------------+ FV DistalFull                                                           +---------+---------------+---------+-----------+----------+-----------------+  PFV      Full                                                           +---------+---------------+---------+-----------+----------+-----------------+ POP      Full           No       Yes                                    +---------+---------------+---------+-----------+----------+-----------------+ PTV      Full                                                           +---------+---------------+---------+-----------+----------+-----------------+ PERO     Full                                                           +---------+---------------+---------+-----------+----------+-----------------+ Gastroc  None           No       No                   Age Indeterminate +---------+---------------+---------+-----------+----------+-----------------+     Summary: RIGHT: - There is no evidence of deep vein thrombosis in the lower extremity.  - No cystic structure found in the popliteal fossa.  LEFT: - Findings consistent with age indeterminate deep vein thrombosis involving the left gastrocnemius veins.  - No cystic structure found in the popliteal fossa.  *See table(s) above for measurements and observations. Electronically signed by Servando Snare MD on 06/19/2021 at 6:03:56 PM.    Final    ECHOCARDIOGRAM COMPLETE  Result Date: 06/19/2021    ECHOCARDIOGRAM REPORT   Patient Name:   KEVEN PYLES Date of Exam: 06/19/2021 Medical Rec #:  [id number]     Height:       74.0 in Accession #:    [id number]    Weight:       270.0 lb Date of Birth:  [birth year]     BSA:          2.469 m Patient Age:    47 years      BP:           104/72 mmHg Patient Gender: M             HR:           107 bpm. Exam Location:  Inpatient Procedure: 2D Echo, Cardiac  Doppler, Color Doppler and Intracardiac            Opacification Agent Indications:    Congestive heart failure  History:        Patient has no prior history of Echocardiogram examinations.                 Abnormal ECG, Signs/Symptoms:Shortness of Breath and Cough; Risk  Factors:Diabetes.  Sonographer:    Rodrigo Ran RCS Referring Phys: [id number] [facility] RATHORE  Sonographer Comments: Image acquisition challenging due to patient body habitus. IMPRESSIONS  1. Left ventricular ejection fraction, by estimation, is <20%. The left ventricle has severely decreased function. The left ventricle demonstrates global hypokinesis. The left ventricular internal cavity size was moderately to severely dilated. There is  mild left ventricular hypertrophy. Left ventricular diastolic parameters were normal.  2. Right ventricular systolic function is mildly reduced. The right ventricular size is not well visualized. There is normal pulmonary artery systolic pressure.  3. Left atrial size was mild to moderately dilated.  4. The mitral valve is normal in structure. Mild mitral valve regurgitation.  5. The aortic valve is tricuspid. Aortic valve regurgitation is not visualized.  6. Aortic no significant ascending aneurysm.  7. The inferior vena cava is dilated in size with >50% respiratory variability, suggesting right atrial pressure of 8 mmHg. Comparison(s): No prior Echocardiogram. FINDINGS  Left Ventricle: Left ventricular ejection fraction, by estimation, is <20%. The left ventricle has severely decreased function. The left ventricle demonstrates global hypokinesis. Definity contrast agent was given IV to delineate the left ventricular endocardial borders. The left ventricular internal cavity size was moderately to severely dilated. There is mild left ventricular hypertrophy. Left ventricular diastolic parameters were normal. Right Ventricle: The right ventricular size is not well visualized. Right vetricular wall  thickness was not well visualized. Right ventricular systolic function is mildly reduced. There is normal pulmonary artery systolic pressure. The tricuspid regurgitant velocity is 2.40 m/s, and with an assumed right atrial pressure of 8 mmHg, the estimated right ventricular systolic pressure is 31.0 mmHg. Left Atrium: Left atrial size was mild to moderately dilated. Right Atrium: Right atrial size was normal in size. Pericardium: There is no evidence of pericardial effusion. Mitral Valve: The mitral valve is normal in structure. Mild mitral valve regurgitation. Tricuspid Valve: The tricuspid valve is normal in structure. Tricuspid valve regurgitation is trivial. Aortic Valve: The aortic valve is tricuspid. Aortic valve regurgitation is not visualized. Aortic valve mean gradient measures 4.0 mmHg. Aortic valve peak gradient measures 5.4 mmHg. Aortic valve area, by VTI measures 2.57 cm. Pulmonic Valve: The pulmonic valve was not well visualized. Pulmonic valve regurgitation is not visualized. Aorta: No significant ascending aneurysm. Venous: The inferior vena cava is dilated in size with greater than 50% respiratory variability, suggesting right atrial pressure of 8 mmHg.  LEFT VENTRICLE PLAX 2D LVIDd:         6.70 cm   Diastology LVIDs:         6.20 cm   LV e' medial:    5.35 cm/s LV PW:         1.20 cm   LV E/e' medial:  18.4 LV IVS:        1.10 cm   LV e' lateral:   14.80 cm/s LVOT diam:     2.10 cm   LV E/e' lateral: 6.7 LV SV:         56 LV SV Index:   23 LVOT Area:     3.46 cm  RIGHT VENTRICLE             IVC RV Basal diam:  5.80 cm     IVC diam: 2.80 cm RV Mid diam:    4.10 cm RV S prime:     15.50 cm/s TAPSE (M-mode): 1.5 cm LEFT ATRIUM  Index        RIGHT ATRIUM           Index LA diam:        5.70 cm  2.31 cm/m   RA Area:     26.20 cm LA Vol (A2C):   149.0 ml 60.35 ml/m  RA Volume:   94.70 ml  38.35 ml/m LA Vol (A4C):   82.0 ml  33.21 ml/m LA Biplane Vol: 116.0 ml 46.98 ml/m  AORTIC VALVE                     PULMONIC VALVE AV Area (Vmax):    3.49 cm     PV Vmax:          0.93 m/s AV Area (Vmean):   3.06 cm     PV Peak grad:     3.5 mmHg AV Area (VTI):     2.57 cm     PR End Diast Vel: 11.56 msec AV Vmax:           116.00 cm/s AV Vmean:          97.100 cm/s AV VTI:            0.218 m AV Peak Grad:      5.4 mmHg AV Mean Grad:      4.0 mmHg LVOT Vmax:         117.00 cm/s LVOT Vmean:        85.900 cm/s LVOT VTI:          0.162 m LVOT/AV VTI ratio: 0.74  AORTA Ao Root diam: 3.60 cm Ao Asc diam:  3.30 cm MITRAL VALVE               TRICUSPID VALVE MV Area (PHT): 7.90 cm    TR Peak grad:   23.0 mmHg MV Decel Time: 96 msec     TR Vmax:        240.00 cm/s MR Peak grad: 58.4 mmHg MR Mean grad: 41.0 mmHg    SHUNTS MR Vmax:      382.00 cm/s  Systemic VTI:  0.16 m MR Vmean:     311.0 cm/s   Systemic Diam: 2.10 cm MV E velocity: 98.50 cm/s MV A velocity: 52.50 cm/s MV E/A ratio:  1.88 Mary Scientist, physiological signed by Phineas Inches Signature Date/Time: 06/19/2021/3:02:05 PM    Final    CT Angio Chest PE W and/or Wo Contrast  Result Date: 06/18/2021 CLINICAL DATA:  Shortness of breath, productive cough. Elevated D-dimer. EXAM: CT ANGIOGRAPHY CHEST WITH CONTRAST TECHNIQUE: Multidetector CT imaging of the chest was performed using the standard protocol during bolus administration of intravenous contrast. Multiplanar CT image reconstructions and MIPs were obtained to evaluate the vascular anatomy. RADIATION DOSE REDUCTION: This exam was performed according to the departmental dose-optimization program which includes automated exposure control, adjustment of the mA and/or kV according to patient size and/or use of iterative reconstruction technique. CONTRAST:  118mL OMNIPAQUE IOHEXOL 350 MG/ML SOLN COMPARISON:  None Available. FINDINGS: Cardiovascular: No filling defects in the pulmonary arteries to suggest pulmonary emboli. Cardiomegaly. Aorta normal caliber. Mediastinum/Nodes: Borderline mediastinal lymph nodes  throughout the mediastinum. No axillary or hilar adenopathy. Trachea and esophagus are unremarkable. Thyroid unremarkable. Lungs/Pleura: Small bilateral pleural effusions. Mild ground-glass opacities could reflect mild edema. Upper Abdomen: Scattered low-density lesions in the liver, likely cysts. No acute abnormality. Musculoskeletal: Chest wall soft tissues are unremarkable. No acute bony abnormality. Review of the MIP images confirms  the above findings. IMPRESSION: No evidence of pulmonary embolus. Cardiomegaly, vascular congestion.  Possible early edema. Small bilateral effusions. Scattered borderline sized mediastinal lymph nodes, likely reactive. Electronically Signed   By: Charlett Nose M.D.   On: 06/18/2021 21:01   DG Chest 2 View  Result Date: 06/18/2021 CLINICAL DATA:  Shortness of breath EXAM: CHEST - 2 VIEW COMPARISON:  None Available. FINDINGS: Gross cardiomegaly. Diffuse bilateral interstitial pulmonary opacity. Osseous structures unremarkable. IMPRESSION: Gross cardiomegaly with diffuse bilateral interstitial pulmonary opacity, likely edema. No focal airspace opacity. Electronically Signed   By: Jearld Lesch M.D.   On: 06/18/2021 18:17      Subjective: Patient seen and examined at the bedside this morning.  Hemodynamically stable for discharge today.  Discharge Exam: Vitals:   06/23/21 0736 06/23/21 1112  BP: 124/83 120/81  Pulse: 91 80  Resp: 19   Temp: 98.3 F (36.8 C) 98.3 F (36.8 C)  SpO2: 92%    Vitals:   06/22/21 2349 06/23/21 0346 06/23/21 0736 06/23/21 1112  BP: 105/64 (!) 111/56 124/83 120/81  Pulse:   91 80  Resp:   19   Temp: 98.3 F (36.8 C) 97.6 F (36.4 C) 98.3 F (36.8 C) 98.3 F (36.8 C)  TempSrc: Oral Oral Oral   SpO2:   92%   Weight:  123.3 kg    Height:        General: Pt is alert, awake, not in acute distress Cardiovascular: RRR, S1/S2 +, no rubs, no gallops Respiratory: CTA bilaterally, no wheezing, no rhonchi Abdominal: Soft, NT, ND,  bowel sounds + Extremities: no edema, no cyanosis    The results of significant diagnostics from this hospitalization (including imaging, microbiology, ancillary and laboratory) are listed below for reference.     Microbiology: Recent Results (from the past 240 hour(s))  MRSA Next Gen by PCR, Nasal     Status: None   Collection Time: 06/19/21  9:24 PM   Specimen: Nasal Mucosa; Nasal Swab  Result Value Ref Range Status   MRSA by PCR Next Gen NOT DETECTED NOT DETECTED Final    Comment: (NOTE) The GeneXpert MRSA Assay (FDA approved for NASAL specimens only), is one component of a comprehensive MRSA colonization surveillance program. It is not intended to diagnose MRSA infection nor to guide or monitor treatment for MRSA infections. Test performance is not FDA approved in patients less than 78 years old. Performed at [facility], [address] [place], [place] [address]   Respiratory (~20 pathogens) panel by PCR     Status: None   Collection Time: 06/20/21  6:42 PM   Specimen: Nasopharyngeal Swab; Respiratory  Result Value Ref Range Status   Adenovirus NOT DETECTED NOT DETECTED Final   Coronavirus 229E NOT DETECTED NOT DETECTED Final    Comment: (NOTE) The Coronavirus on the Respiratory Panel, DOES NOT test for the novel  Coronavirus (2019 nCoV)    Coronavirus HKU1 NOT DETECTED NOT DETECTED Final   Coronavirus NL63 NOT DETECTED NOT DETECTED Final   Coronavirus OC43 NOT DETECTED NOT DETECTED Final   Metapneumovirus NOT DETECTED NOT DETECTED Final   Rhinovirus / Enterovirus NOT DETECTED NOT DETECTED Final   Influenza A NOT DETECTED NOT DETECTED Final   Influenza B NOT DETECTED NOT DETECTED Final   Parainfluenza Virus 1 NOT DETECTED NOT DETECTED Final   Parainfluenza Virus 2 NOT DETECTED NOT DETECTED Final   Parainfluenza Virus 3 NOT DETECTED NOT DETECTED Final   Parainfluenza Virus 4 NOT DETECTED NOT  DETECTED Final   Respiratory Syncytial Virus NOT DETECTED NOT  DETECTED Final   Bordetella pertussis NOT DETECTED NOT DETECTED Final   Bordetella Parapertussis NOT DETECTED NOT DETECTED Final   Chlamydophila pneumoniae NOT DETECTED NOT DETECTED Final   Mycoplasma pneumoniae NOT DETECTED NOT DETECTED Final    Comment: Performed at [facility], [address] [place], [place] [address]     Labs: BNP (last 3 results) Recent Labs    06/18/21 1736 06/22/21 0123 06/23/21 0122  BNP 1,295.7* 569.5* 265.8*   Basic Metabolic Panel: Recent Labs  Lab 06/19/21 0426 06/20/21 0407 06/20/21 1338 06/20/21 1339 06/20/21 1346 06/21/21 0209 06/22/21 0123 06/23/21 0122  NA 139 141   < > 142 143 141 137 137  K 4.1 3.6   < > 3.6 3.5 4.3 3.8 3.5  CL 103 103  --   --   --  102 97* 93*  CO2 28 29  --   --   --  28 27 29   GLUCOSE 192* 138*  --   --   --  120* 134* 138*  BUN 14 14  --   --   --  13 16 16   CREATININE 0.93 0.78  --   --   --  0.91 0.82 0.89  CALCIUM 8.4* 8.2*  --   --   --  8.6* 8.2* 8.7*  MG  --  2.1  --   --   --   --  2.3  --    < > = values in this interval not displayed.   Liver Function Tests: Recent Labs  Lab 06/18/21 1735 06/19/21 0426 06/21/21 0209  AST 24 21 22   ALT 53* 48* 34  ALKPHOS 81 78 71  BILITOT 1.2 1.0 1.3*  PROT 7.0 6.5 6.7  ALBUMIN 3.5 3.3* 3.2*   No results for input(s): "LIPASE", "AMYLASE" in the last 168 hours. No results for input(s): "AMMONIA" in the last 168 hours. CBC: Recent Labs  Lab 06/18/21 1735 06/19/21 0426 06/20/21 0407 06/20/21 1338 06/20/21 1339 06/20/21 1346 06/21/21 0209  WBC 7.8 9.3 8.1  --   --   --  8.6  NEUTROABS 4.5  --   --   --   --   --   --   HGB 12.6* 12.3* 12.2* 13.6 13.3 12.9* 12.0*  HCT 40.5 39.4 38.7* 40.0 39.0 38.0* 38.7*  MCV 89.2 88.7 87.4  --   --   --  87.8  PLT 368 341 311  --   --   --  339   Cardiac Enzymes: No results for input(s): "CKTOTAL", "CKMB", "CKMBINDEX", "TROPONINI" in the last 168 hours. BNP: Invalid input(s): "POCBNP" CBG: Recent Labs   Lab 06/22/21 1117 06/22/21 1640 06/22/21 2107 06/23/21 0613 06/23/21 1115  GLUCAP 253* 164* 173* 147* 193*   D-Dimer No results for input(s): "DDIMER" in the last 72 hours. Hgb A1c No results for input(s): "HGBA1C" in the last 72 hours. Lipid Profile Recent Labs    06/21/21 0209  CHOL 78  HDL 22*  LDLCALC 48  TRIG 39  CHOLHDL 3.5  LDLDIRECT 51.4   Thyroid function studies No results for input(s): "TSH", "T4TOTAL", "T3FREE", "THYROIDAB" in the last 72 hours.  Invalid input(s): "FREET3" Anemia work up No results for input(s): "VITAMINB12", "FOLATE", "FERRITIN", "TIBC", "IRON", "RETICCTPCT" in the last 72 hours. Urinalysis No results found for: "COLORURINE", "APPEARANCEUR", "LABSPEC", "PHURINE", "GLUCOSEU", "HGBUR", "BILIRUBINUR", "KETONESUR", "PROTEINUR", "UROBILINOGEN", "NITRITE", "LEUKOCYTESUR" Sepsis Labs Recent Labs  Lab 06/18/21 1735 06/19/21 0426 06/20/21 0407 06/21/21 0209  WBC 7.8 9.3 8.1 8.6   Microbiology Recent Results (from the past 240 hour(s))  MRSA Next Gen by PCR, Nasal     Status: None   Collection Time: 06/19/21  9:24 PM   Specimen: Nasal Mucosa; Nasal Swab  Result Value Ref Range Status   MRSA by PCR Next Gen NOT DETECTED NOT DETECTED Final    Comment: (NOTE) The GeneXpert MRSA Assay (FDA approved for NASAL specimens only), is one component of a comprehensive MRSA colonization surveillance program. It is not intended to diagnose MRSA infection nor to guide or monitor treatment for MRSA infections. Test performance is not FDA approved in patients less than 33 years old. Performed at [facility], [place] [address] [place], [place] [address]   Respiratory (~20 pathogens) panel by PCR     Status: None   Collection Time: 06/20/21  6:42 PM   Specimen: Nasopharyngeal Swab; Respiratory  Result Value Ref Range Status   Adenovirus NOT DETECTED NOT DETECTED Final   Coronavirus 229E NOT DETECTED NOT DETECTED Final    Comment: (NOTE) The  Coronavirus on the Respiratory Panel, DOES NOT test for the novel  Coronavirus (2019 nCoV)    Coronavirus HKU1 NOT DETECTED NOT DETECTED Final   Coronavirus NL63 NOT DETECTED NOT DETECTED Final   Coronavirus OC43 NOT DETECTED NOT DETECTED Final   Metapneumovirus NOT DETECTED NOT DETECTED Final   Rhinovirus / Enterovirus NOT DETECTED NOT DETECTED Final   Influenza A NOT DETECTED NOT DETECTED Final   Influenza B NOT DETECTED NOT DETECTED Final   Parainfluenza Virus 1 NOT DETECTED NOT DETECTED Final   Parainfluenza Virus 2 NOT DETECTED NOT DETECTED Final   Parainfluenza Virus 3 NOT DETECTED NOT DETECTED Final   Parainfluenza Virus 4 NOT DETECTED NOT DETECTED Final   Respiratory Syncytial Virus NOT DETECTED NOT DETECTED Final   Bordetella pertussis NOT DETECTED NOT DETECTED Final   Bordetella Parapertussis NOT DETECTED NOT DETECTED Final   Chlamydophila pneumoniae NOT DETECTED NOT DETECTED Final   Mycoplasma pneumoniae NOT DETECTED NOT DETECTED Final    Comment: Performed at [facility], [place]. [address] [place], [place] [address]    Please note: You were cared for by a hospitalist during your hospital stay. Once you are discharged, your primary care physician will handle any further medical issues. Please note that NO REFILLS for any discharge medications will be authorized once you are discharged, as it is imperative that you return to your primary care physician (or establish a relationship with a primary care physician if you do not have one) for your post hospital discharge needs so that they can reassess your need for medications and monitor your lab values.    Time coordinating discharge: 40 minutes  SIGNED:   Shelly Coss, MD  [facility] 06/23/2021, 11:34 AM Pager [phone]  If 7PM-7AM, please contact night-coverage [email or web address] Password [email or web address]

## 2021-06-27 ENCOUNTER — Telehealth: Payer: Self-pay

## 2021-06-27 ENCOUNTER — Other Ambulatory Visit: Payer: Self-pay

## 2021-06-27 DIAGNOSIS — I5041 Acute combined systolic (congestive) and diastolic (congestive) heart failure: Secondary | ICD-10-CM

## 2021-06-27 NOTE — Telephone Encounter (Signed)
Location of hospitalization: [facility] Reason for hospitalization: Patient was having shortness of breath and chest pain.  Date of discharge: 06/23/2021 Date of first communication with patient: today Person contacting patient: Marvell Fuller  Current symptoms: none Do you understand why you were in the Hospital: Yes Questions regarding discharge instructions: None Where were you discharged to: Home Medications reviewed: Yes Allergies reviewed: Yes Dietary changes reviewed: Yes. Discussed low fat and low salt diet.  Referals reviewed: NA Activities of Daily Living: Able to with mild limitations Any transportation issues/concerns: None Any patient concerns: None Confirmed importance & date/time of Follow up appt: Yes Confirmed with patient if condition begins to worsen call. Pt was given the office number and encouraged to call back with questions or concerns: Yes

## 2021-06-30 ENCOUNTER — Ambulatory Visit: Payer: Self-pay | Admitting: Cardiology

## 2021-07-06 ENCOUNTER — Telehealth (HOSPITAL_BASED_OUTPATIENT_CLINIC_OR_DEPARTMENT_OTHER): Payer: Self-pay

## 2021-07-06 ENCOUNTER — Other Ambulatory Visit (HOSPITAL_BASED_OUTPATIENT_CLINIC_OR_DEPARTMENT_OTHER): Payer: Self-pay

## 2021-07-06 NOTE — Telephone Encounter (Signed)
Transitions of Care Pharmacy   Call attempted for a pharmacy transitions of care follow-up. No voicemail.  Call attempt #1. Will follow-up in 2-3 days.     Jiles Crocker, PharmD Clinical Pharmacist [facility] 07/06/2021 3:01 PM

## 2021-07-14 ENCOUNTER — Telehealth (HOSPITAL_COMMUNITY): Payer: Self-pay

## 2021-07-14 NOTE — Telephone Encounter (Signed)
Transitions of Care Pharmacy   Call attempted for a pharmacy transitions of care follow-up. Patient's voicemail box was unavailable.   Call attempt #2. Will follow-up in 2-3 days.

## 2021-07-17 ENCOUNTER — Ambulatory Visit: Payer: Self-pay | Admitting: Cardiology

## 2021-07-17 ENCOUNTER — Telehealth (HOSPITAL_COMMUNITY): Payer: Self-pay

## 2021-07-17 ENCOUNTER — Encounter: Payer: Self-pay | Admitting: Cardiology

## 2021-07-17 ENCOUNTER — Other Ambulatory Visit (HOSPITAL_COMMUNITY): Payer: Self-pay

## 2021-07-17 VITALS — BP 112/64 | HR 83 | Temp 99.0°F | Resp 16 | Ht 74.0 in | Wt 274.2 lb

## 2021-07-17 DIAGNOSIS — E1165 Type 2 diabetes mellitus with hyperglycemia: Secondary | ICD-10-CM

## 2021-07-17 DIAGNOSIS — I428 Other cardiomyopathies: Secondary | ICD-10-CM

## 2021-07-17 DIAGNOSIS — I5042 Chronic combined systolic (congestive) and diastolic (congestive) heart failure: Secondary | ICD-10-CM

## 2021-07-17 DIAGNOSIS — I82462 Acute embolism and thrombosis of left calf muscular vein: Secondary | ICD-10-CM

## 2021-07-17 MED ORDER — DAPAGLIFLOZIN PROPANEDIOL 10 MG PO TABS: 10.0000 mg | ORAL_TABLET | Freq: Every day | ORAL | 0 refills | Status: DC

## 2021-07-17 MED ORDER — TORSEMIDE 10 MG PO TABS: 10.0000 mg | ORAL_TABLET | Freq: Every day | ORAL | 0 refills | Status: DC

## 2021-07-17 MED ORDER — LOSARTAN POTASSIUM 25 MG PO TABS: 25.0000 mg | ORAL_TABLET | Freq: Every day | ORAL | 0 refills | Status: DC

## 2021-07-17 MED ORDER — ATORVASTATIN CALCIUM 20 MG PO TABS: 20.0000 mg | ORAL_TABLET | Freq: Every day | ORAL | 0 refills | Status: DC

## 2021-07-17 MED ORDER — SPIRONOLACTONE 25 MG PO TABS: 25.0000 mg | ORAL_TABLET | Freq: Every morning | ORAL | 0 refills | Status: DC

## 2021-07-17 MED ORDER — METOPROLOL SUCCINATE ER 25 MG PO TB24: 25.0000 mg | ORAL_TABLET | Freq: Every morning | ORAL | 0 refills | Status: DC

## 2021-07-17 NOTE — Telephone Encounter (Signed)
Transitions of Care Pharmacy   Call attempted for a pharmacy transitions of care follow-up. HIPAA appropriate voicemail was left with call back information provided.   Call attempt #3. Will remove from call list.

## 2021-07-17 NOTE — Progress Notes (Signed)
ID:  Jerry Saunders, DOB 08-21-1970, MRN [id number]  PCP:  Oneita Hurt, No  Cardiologist:  Tessa Lerner, DO, FACC (established care 06/20/2021)  Date: 07/17/21 Last Visit: 06/23/2021  Chief Complaint  Patient presents with   Congestive Heart Failure   Follow-up    1 week    HPI  Jerry Saunders is a 51 y.o. African-American male whose past medical history and cardiovascular risk factors include: Premature ventricular contractions, newly discovered type II diabetic, newly discovered systolic and diastolic heart failure, nonischemic cardiomyopathy, obesity.  Presented to the hospital in June 2023 with symptoms of congestive heart failure.  His echocardiogram noted LVEF <20% with grade 2 diastolic dysfunction and cardiology was consulted for further evaluation and management.  Patient underwent heart catheterization and cardiac MRI results reviewed and noted below for further reference.  Medical therapy was uptitrated during his hospitalization he diuresed approximately 15 mL of urine output.  He now presents for follow-up.  Barriers to follow-up include lack of insurance.  Patient states that he should have insurance as of August 2023.  No recent labs available for review as it is cost prohibitive.  Patient is requesting refills on all of his cardiac medications.  Since the discharge from the hospital he denies anginal discomfort or heart failure symptoms.  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  His discharge weight 123 kg and today he is 124 kg.  ALLERGIES: No Known Allergies  MEDICATION LIST PRIOR TO VISIT: Current Meds  Medication Sig   apixaban (ELIQUIS) 5 MG TABS tablet Take 2 tablets (10 mg total) by mouth 2 (two) times daily for 4 days. Then take 1 (one) tablet twice (two) daily.   apixaban (ELIQUIS) 5 MG TABS tablet Take 1 tablet (5 mg total) by mouth 2 (two) times daily.    Artificial Tear Ointment (DRY EYES OP) Place 2 drops into both eyes 2 (two) times daily as needed (dry eyes).   metFORMIN (GLUCOPHAGE) 500 MG tablet Take 1 tablet (500 mg total) by mouth 2 (two) times daily with a meal.   metoprolol succinate (TOPROL-XL) 25 MG 24 hr tablet Take 1 tablet (25 mg total) by mouth every morning. Take with or immediately following a meal.   [DISCONTINUED] atorvastatin (LIPITOR) 20 MG tablet Take 1 tablet (20 mg total) by mouth at bedtime.   [DISCONTINUED] dapagliflozin propanediol (FARXIGA) 10 MG TABS tablet Take 1 tablet (10 mg total) by mouth daily.   [DISCONTINUED] ivabradine (CORLANOR) 5 MG TABS tablet Take 1 tablet (5 mg total) by mouth 2 (two) times daily with a meal.   [DISCONTINUED] losartan (COZAAR) 25 MG tablet Take 1 tablet (25 mg total) by mouth daily.   [DISCONTINUED] spironolactone (ALDACTONE) 25 MG tablet Take 1 tablet (25 mg total) by mouth in the morning.   [DISCONTINUED] torsemide (DEMADEX) 10 MG tablet Take 1 tablet (10 mg total) by mouth daily.     PAST MEDICAL HISTORY: History reviewed. No pertinent past medical history.  PAST SURGICAL HISTORY: Past Surgical History:  Procedure Laterality Date   RIGHT/LEFT HEART CATH AND CORONARY ANGIOGRAPHY N/A 06/20/2021   Procedure: RIGHT/LEFT HEART CATH AND CORONARY ANGIOGRAPHY;  Surgeon: Elder Negus, MD;  Location: [facility];  Service: Cardiovascular;  Laterality: N/A;    FAMILY HISTORY: The patient family history is not on file.  SOCIAL HISTORY:  The patient  reports that he has never  smoked. He does not have any smokeless tobacco history on file. He reports that he does not drink alcohol and does not use drugs.  REVIEW OF SYSTEMS: Review of Systems  Cardiovascular:  Negative for chest pain, dyspnea on exertion, leg swelling, near-syncope, orthopnea, palpitations, paroxysmal nocturnal dyspnea and syncope.  Respiratory:  Negative for shortness of breath.   Hematologic/Lymphatic:  Negative for bleeding problem.  Neurological:  Negative for dizziness and light-headedness.    PHYSICAL EXAM:    07/17/2021    3:48 PM 06/23/2021   11:12 AM 06/23/2021    7:36 AM  Vitals with BMI  Height 6\' 2"     Weight 274 lbs 3 oz    BMI 35.19    Systolic 112 120  Diastolic 64 81 83  Pulse 83 80 91    CONSTITUTIONAL: Age-appropriate, well-developed and well-nourished. No acute distress.  SKIN: Skin is warm and dry. No rash noted. No cyanosis. No pallor. No jaundice HEAD: Normocephalic and atraumatic.  EYES: No scleral icterus MOUTH/THROAT: Moist oral membranes.  NECK: No JVD present. No thyromegaly noted. No carotid bruits  CHEST Normal respiratory effort. No intercostal retractions  LUNGS: Clear to auscultation bilaterally.  No stridor. No wheezes. No rales.  CARDIOVASCULAR: Regular rate and rhythm, positive S1-S2, no murmurs rubs or gallops appreciated. ABDOMINAL: Obese, soft, nontender, nondistended, positive bowel sounds in all 4 quadrants, no apparent ascites.  EXTREMITIES: No peripheral edema, warm to touch, 2+ bilateral DP and PT pulses HEMATOLOGIC: No significant bruising NEUROLOGIC: Oriented to person, place, and time. Nonfocal. Normal muscle tone.  PSYCHIATRIC: Normal mood and affect. Normal behavior. Cooperative  CARDIAC DATABASE: EKG: 07/17/2021: NSR, 80 bpm, left axis, left anterior fascicular block, LAE, Without underlying injury pattern.  Echocardiogram: 06/19/2021: LVEF <20%, severely reduced systolic function, global hypokinesis, left ventricular size is dilated, right ventricular size is dilated, systolic function reduced, biatrial enlargement, mild MR, IVC dilated, estimated RAP 15 mmHg, Definity contrast was utilized LV thrombus not present (This is a personal read, it differs from the final report).   Stress Testing:  None   Heart Catheterization: Right dominant circulation, normal coronaries No angiographic evidence of coronary artery disease    RA: 16 mmHg RV: 69/11 mmHg, RVEDP 24 mmHg PA: 67/43 mmHg, mPAP 54 mmHg PCW: 27 mmHg   CO: 5.8 L/min CI: 2.3 L/min/m2   Patient went into SVT 3 times with any minimal irritation of LV during left heart catheterization, requiring vagal maneuvers, adenosine 6 mg, 12 mg, with conversion to sinus rhythm   Conclusion: Decompensated nonischemic cardiomyopathy Consider dilated or arrhythmia induced cardiomyopathy  Severe pulmonary hypertension, WHO grp II   GDMT for HFrEF with continued workup for etiology evaluation  Cardiac MRI 06/22/2021 1.  Severe LV dilatation with severe systolic dysfunction (EF 20%)  2.  Moderate RV dilatation with severe systolic dysfunction (EF 27%)  3. RV insertion site late gadolinium enhancement, which is a nonspecific finding often seen in setting of elevated pulmonary pressures. No other LGE seen.   Bilateral lower extremity venous duplex: 06/19/2021 RIGHT:  - There is no evidence of deep vein thrombosis in the lower extremity.   - No cystic structure found in the popliteal fossa.     LEFT:  - Findings consistent with age indeterminate deep vein thrombosis involving the left gastrocnemius veins.  - No cystic structure found in the popliteal fossa.   LABORATORY DATA:    Latest Ref Rng & Units 06/21/2021    2:09 AM 06/20/2021    1:46  PM 06/20/2021    1:39 PM  CBC  WBC 4.0 - 10.5 K/uL 8.6     Hemoglobin 13.0 - 17.0 g/dL 81.1  57.2  62.0   Hematocrit 39.0 - 52.0 % 38.7  38.0  39.0   Platelets 150 - 400 K/uL 339          Latest Ref Rng & Units 06/23/2021    1:22 AM 06/22/2021    1:23 AM 06/21/2021    2:09 AM  CMP  Glucose 70 - 99 mg/dL 355  974  163   BUN 6 - 20 mg/dL 16  16  13    Creatinine 0.61 - 1.24 mg/dL  8.45  3.64   Sodium 135 - 145 mmol/L 137  137  141   Potassium 3.5 - 5.1 mmol/L 3.5  3.8  4.3   Chloride 98 - 111 mmol/L 93  97  102   CO2 22 - 32 mmol/L 29  27  28    Calcium 8.9 - 10.3 mg/dL 8.7  8.2  8.6   Total Protein 6.5 - 8.1  g/dL   6.7   Total Bilirubin 0.3 - 1.2 mg/dL   1.3   Alkaline Phos 38 - 126 U/L   71   AST 15 - 41 U/L   22   ALT 0 - 44 U/L   34     Lipid Panel     Component Value Date/Time   CHOL 78 06/21/2021 0209   TRIG 39 06/21/2021 0209   HDL 22 (L) 06/21/2021 0209   CHOLHDL 3.5 06/21/2021 0209   VLDL 8 06/21/2021 0209   LDLCALC 48 06/21/2021 0209   LDLDIRECT 51.4 06/21/2021 0209    No components found for: "NTPROBNP" No results for input(s): "PROBNP" in the last 8760 hours. Recent Labs    06/19/21 0045  TSH 1.260    BMP Recent Labs    06/21/21 0209 06/22/21 0123 06/23/21 0122  NA 141 137 137  K 4.3 3.8 3.5  CL 102 97* 93*  CO2 28 27 29   GLUCOSE 120* 134* 138*  BUN 13 16 16   CREATININE 0.91 0.82 0.89  CALCIUM 8.6* 8.2* 8.7*  GFRNONAA >60 >60 >60    HEMOGLOBIN A1C Lab Results  Component Value Date   HGBA1C 7.8 (H) 06/19/2021   MPG 177.16 06/19/2021    IMPRESSION:    ICD-10-CM   1. Chronic combined systolic and diastolic congestive heart failure (HCC)  I50.42 atorvastatin (LIPITOR) 20 MG tablet    dapagliflozin propanediol (FARXIGA) 10 MG TABS tablet    losartan (COZAAR) 25 MG tablet    spironolactone (ALDACTONE) 25 MG tablet    torsemide (DEMADEX) 10 MG tablet    Ambulatory referral to Sleep Studies    metoprolol succinate (TOPROL-XL) 25 MG 24 hr tablet    EKG 12-Lead    2. Nonischemic cardiomyopathy (HCC)  I42.8 atorvastatin (LIPITOR) 20 MG tablet    dapagliflozin propanediol (FARXIGA) 10 MG TABS tablet    losartan (COZAAR) 25 MG tablet    spironolactone (ALDACTONE) 25 MG tablet    torsemide (DEMADEX) 10 MG tablet    Ambulatory referral to Sleep Studies    metoprolol succinate (TOPROL-XL) 25 MG 24 hr tablet    3. Type 2 diabetes mellitus with hyperglycemia, without long-term current use of insulin (HCC)  E11.65     4. Deep vein thrombosis (DVT) of calf muscle vein of left lower extremity, unspecified chronicity (HCC)  I82.462  RECOMMENDATIONS: Jerry Saunders is a 51 y.o. African-American male whose past medical history and cardiac risk factors include: Premature ventricular contractions, newly discovered type II diabetic, newly discovered systolic and diastolic heart failure, nonischemic cardiomyopathy, obesity.  Chronic combined systolic and diastolic congestive heart failure (HCC) Stage C, NYHA class II. Medications refilled as noted above. Unable to uptitrate GDMT due to lack of insurance at this time.   Patient states that he should have insurance and stated that as of August 2023.  Patient unable to do labs due to it being cost prohibitive.  Strict I's and O's, daily weights. Patient's weight remains stable since discharge.  Discontinue Corlanor. Start Toprol-XL 25 mg p.o. daily.  We will refer to sleep medicine for evaluation of sleep apnea and he was noted to have apneic episodes during his recent hospitalization and given his comorbid conditions. Further recommendations to follow  Nonischemic cardiomyopathy (HCC) See above  Type 2 diabetes mellitus with hyperglycemia, without long-term current use of insulin (HCC) Currently on Farxiga, losartan, statin therapy.  Unable to tolerate metformin due to GI symptoms. Patient still has to make an appointment with PCP for which he is waiting until August 2023. Educated importance of glycemic control.  Deep vein thrombosis (DVT) of calf muscle vein of left lower extremity, unspecified chronicity (HCC) Currently on oral anticoagulation as per hospitalist recommendations. We will defer management to PCP going forward.  FINAL MEDICATION LIST END OF ENCOUNTER: Meds ordered this encounter  Medications   atorvastatin (LIPITOR) 20 MG tablet    Sig: Take 1 tablet (20 mg total) by mouth at bedtime.    Dispense:  90 tablet    Refill:  0   dapagliflozin propanediol (FARXIGA) 10 MG TABS tablet    Sig: Take 1 tablet (10 mg total) by mouth daily.    Dispense:  30  tablet    Refill:  0   losartan (COZAAR) 25 MG tablet    Sig: Take 1 tablet (25 mg total) by mouth daily.    Dispense:  30 tablet    Refill:  0   spironolactone (ALDACTONE) 25 MG tablet    Sig: Take 1 tablet (25 mg total) by mouth in the morning.    Dispense:  30 tablet    Refill:  0   torsemide (DEMADEX) 10 MG tablet    Sig: Take 1 tablet (10 mg total) by mouth daily.    Dispense:  30 tablet    Refill:  0   metoprolol succinate (TOPROL-XL) 25 MG 24 hr tablet    Sig: Take 1 tablet (25 mg total) by mouth every morning. Take with or immediately following a meal.    Dispense:  30 tablet    Refill:  0    Medications Discontinued During This Encounter  Medication Reason   ivabradine (CORLANOR) 5 MG TABS tablet Change in therapy   atorvastatin (LIPITOR) 20 MG tablet Reorder   dapagliflozin propanediol (FARXIGA) 10 MG TABS tablet Reorder   losartan (COZAAR) 25 MG tablet Reorder   spironolactone (ALDACTONE) 25 MG tablet    torsemide (DEMADEX) 10 MG tablet Reorder     Current Outpatient Medications:    apixaban (ELIQUIS) 5 MG TABS tablet, Take 2 tablets (10 mg total) by mouth 2 (two) times daily for 4 days. Then take 1 (one) tablet twice (two) daily., Disp: 74 tablet, Rfl: 0   apixaban (ELIQUIS) 5 MG TABS tablet, Take 1 tablet (5 mg total) by mouth 2 (two) times daily., Disp: 60 tablet, Rfl:  2   Artificial Tear Ointment (DRY EYES OP), Place 2 drops into both eyes 2 (two) times daily as needed (dry eyes)., Disp: , Rfl:    metFORMIN (GLUCOPHAGE) 500 MG tablet, Take 1 tablet (500 mg total) by mouth 2 (two) times daily with a meal., Disp: 60 tablet, Rfl: 3   metoprolol succinate (TOPROL-XL) 25 MG 24 hr tablet, Take 1 tablet (25 mg total) by mouth every morning. Take with or immediately following a meal., Disp: 30 tablet, Rfl: 0   atorvastatin (LIPITOR) 20 MG tablet, Take 1 tablet (20 mg total) by mouth at bedtime., Disp: 90 tablet, Rfl: 0   dapagliflozin propanediol (FARXIGA) 10 MG TABS  tablet, Take 1 tablet (10 mg total) by mouth daily., Disp: 30 tablet, Rfl: 0   losartan (COZAAR) 25 MG tablet, Take 1 tablet (25 mg total) by mouth daily., Disp: 30 tablet, Rfl: 0   spironolactone (ALDACTONE) 25 MG tablet, Take 1 tablet (25 mg total) by mouth in the morning., Disp: 30 tablet, Rfl: 0   torsemide (DEMADEX) 10 MG tablet, Take 1 tablet (10 mg total) by mouth daily., Disp: 30 tablet, Rfl: 0  Orders Placed This Encounter  Procedures   Ambulatory referral to Sleep Studies   EKG 12-Lead    There are no Patient Instructions on file for this visit.   --Continue cardiac medications as reconciled in final medication list. --Return in about 5 weeks (around 08/22/2021) for Follow up, heart failure management.. or sooner if needed. --Continue follow-up with your primary care physician regarding the management of your other chronic comorbid conditions.  Patient's questions and concerns were addressed to his satisfaction. He voices understanding of the instructions provided during this encounter.   This note was created using a voice recognition software as a result there may be grammatical errors inadvertently enclosed that do not reflect the nature of this encounter. Every attempt is made to correct such errors.  Tessa Lerner, [place], [facility]  Pager: [phone] Office: [phone]

## 2021-08-01 ENCOUNTER — Other Ambulatory Visit: Payer: Self-pay

## 2021-08-01 DIAGNOSIS — I5042 Chronic combined systolic (congestive) and diastolic (congestive) heart failure: Secondary | ICD-10-CM

## 2021-08-01 DIAGNOSIS — I428 Other cardiomyopathies: Secondary | ICD-10-CM

## 2021-08-01 MED ORDER — DAPAGLIFLOZIN PROPANEDIOL 10 MG PO TABS: 10.0000 mg | ORAL_TABLET | Freq: Every day | ORAL | 3 refills | Status: DC

## 2021-08-21 ENCOUNTER — Ambulatory Visit: Payer: 59 | Admitting: Cardiology

## 2021-08-21 ENCOUNTER — Encounter: Payer: Self-pay | Admitting: Cardiology

## 2021-08-21 VITALS — BP 114/78 | HR 98 | Temp 97.7°F | Resp 16 | Ht 74.0 in | Wt 279.0 lb

## 2021-08-21 DIAGNOSIS — I5042 Chronic combined systolic (congestive) and diastolic (congestive) heart failure: Secondary | ICD-10-CM

## 2021-08-21 DIAGNOSIS — I428 Other cardiomyopathies: Secondary | ICD-10-CM

## 2021-08-21 DIAGNOSIS — I82462 Acute embolism and thrombosis of left calf muscular vein: Secondary | ICD-10-CM

## 2021-08-21 DIAGNOSIS — E1165 Type 2 diabetes mellitus with hyperglycemia: Secondary | ICD-10-CM

## 2021-08-21 MED ORDER — SPIRONOLACTONE 25 MG PO TABS: 25.0000 mg | ORAL_TABLET | Freq: Every morning | ORAL | 0 refills | Status: DC

## 2021-08-21 MED ORDER — DAPAGLIFLOZIN PROPANEDIOL 10 MG PO TABS: 10.0000 mg | ORAL_TABLET | Freq: Every day | ORAL | 3 refills | Status: DC

## 2021-08-21 MED ORDER — METOPROLOL SUCCINATE ER 50 MG PO TB24: 50.0000 mg | ORAL_TABLET | Freq: Every morning | ORAL | 0 refills | Status: DC

## 2021-08-21 MED ORDER — TORSEMIDE 10 MG PO TABS: 10.0000 mg | ORAL_TABLET | Freq: Every day | ORAL | 0 refills | Status: DC

## 2021-08-21 MED ORDER — LOSARTAN POTASSIUM 25 MG PO TABS: 25.0000 mg | ORAL_TABLET | Freq: Every day | ORAL | 0 refills | Status: DC

## 2021-08-21 NOTE — Progress Notes (Signed)
ID:  Jerry Saunders, DOB May 01, 1970, MRN [id number]  PCP:  Oneita Hurt, No  Cardiologist:  Tessa Lerner, DO, FACC (established care 06/20/2021)  Date: 08/21/21 Last Office Visit: 07/17/2021  Chief Complaint  Patient presents with   Follow-up    Heart failure    HPI  Jerry Saunders is a 50 y.o. African-American male whose past medical history and cardiovascular risk factors include: Premature ventricular contractions, newly discovered type II diabetic, newly discovered systolic and diastolic heart failure, nonischemic cardiomyopathy, obesity.  Was hospitalized in June 2023 for congestive heart failure and was noted to have an LVEF of <20% with grade 2 diastolic dysfunction.  Patient underwent heart catheterization and cardiac MRI results noted below for further reference.  He has been on up titration of medical therapy and now presents for follow-up.  Post discharge barriers to care include lack of insurance.  He was also recommended to have a LifeVest at the time of discharge from the hospital but the patient refused.  At the last office visit he was started on metoprolol succinate and discontinued Corlanor.  It was also referred to sleep medicine.  His discharge weight was 123 kg and today's weight is 126kg.   Due to lack of insurance patient has stopped taking his Eliquis and [place].  He did not reach out to see if there are alternatives or apply for patient assistance.  Patient denies anginal discomfort or heart failure symptoms.  Continues to be active at his work without having any effort related symptoms of shortness of breath, near-syncope, syncope.  ALLERGIES: No Known Allergies  MEDICATION LIST PRIOR TO VISIT: Current Meds  Medication Sig   apixaban (ELIQUIS) 5 MG TABS tablet Take 1 tablet (5 mg total) by mouth 2 (two) times daily.   Artificial Tear Ointment (DRY EYES OP) Place 2 drops into both eyes 2 (two) times daily as needed (dry eyes).   atorvastatin (LIPITOR) 20 MG tablet Take  1 tablet (20 mg total) by mouth at bedtime.   dapagliflozin propanediol (FARXIGA) 10 MG TABS tablet Take 1 tablet (10 mg total) by mouth daily.   losartan (COZAAR) 25 MG tablet Take 1 tablet (25 mg total) by mouth daily.   metFORMIN (GLUCOPHAGE) 500 MG tablet Take 1 tablet (500 mg total) by mouth 2 (two) times daily with a meal.   metoprolol succinate (TOPROL XL) 50 MG 24 hr tablet Take 1 tablet (50 mg total) by mouth every morning. Take with or immediately following a meal.   spironolactone (ALDACTONE) 25 MG tablet Take 1 tablet (25 mg total) by mouth in the morning.   torsemide (DEMADEX) 10 MG tablet Take 1 tablet (10 mg total) by mouth daily.     PAST MEDICAL HISTORY: Past Medical History:  Diagnosis Date   Cardiomyopathy (HCC)    CHF (congestive heart failure) (HCC)    Diabetes mellitus without complication (HCC)    DVT (deep venous thrombosis) (HCC)    Hyperlipidemia    Hypertension     PAST SURGICAL HISTORY: Past Surgical History:  Procedure Laterality Date   RIGHT/LEFT HEART CATH AND CORONARY ANGIOGRAPHY N/A 06/20/2021   Procedure: RIGHT/LEFT HEART CATH AND CORONARY ANGIOGRAPHY;  Surgeon: Elder Negus, MD;  Location: [facility];  Service: Cardiovascular;  Laterality: N/A;    FAMILY HISTORY: The patient family history is not on file.  SOCIAL HISTORY:  The patient  reports that he has never smoked. He does not have any smokeless tobacco history on file. He reports that he  does not drink alcohol and does not use drugs.  REVIEW OF SYSTEMS: Review of Systems  Cardiovascular:  Negative for chest pain, dyspnea on exertion, leg swelling, near-syncope, orthopnea, palpitations, paroxysmal nocturnal dyspnea and syncope.  Respiratory:  Negative for shortness of breath.   Hematologic/Lymphatic: Negative for bleeding problem.  Neurological:  Negative for dizziness and light-headedness.    PHYSICAL EXAM:    08/21/2021    3:49 PM 07/17/2021    3:48 PM 06/23/2021    11:12 AM  Vitals with BMI  Height 6\' 2"  6\' 2"    Weight 279 lbs 274 lbs 3 oz   BMI 35.81 35.19   Systolic 114 112 003  Diastolic 78 64 81  Pulse 98 83 80    CONSTITUTIONAL: Age-appropriate, well-developed and well-nourished. No acute distress.  SKIN: Skin is warm and dry. No rash noted. No cyanosis. No pallor. No jaundice HEAD: Normocephalic and atraumatic.  EYES: No scleral icterus MOUTH/THROAT: Moist oral membranes.  NECK: No JVD present. No thyromegaly noted. No carotid bruits  CHEST Normal respiratory effort. No intercostal retractions  LUNGS: Clear to auscultation bilaterally.  No stridor. No wheezes. No rales.  CARDIOVASCULAR: Regular, tachycardia, positive S1-S2, no murmurs rubs or gallops appreciated. ABDOMINAL: Obese, soft, nontender, nondistended, positive bowel sounds in all 4 quadrants, no apparent ascites.  EXTREMITIES: No peripheral edema, warm to touch, 2+ bilateral DP and PT pulses HEMATOLOGIC: No significant bruising NEUROLOGIC: Oriented to person, place, and time. Nonfocal. Normal muscle tone.  PSYCHIATRIC: Normal mood and affect. Normal behavior. Cooperative  CARDIAC DATABASE: EKG: 07/17/2021: NSR, 80 bpm, left axis, left anterior fascicular block, LAE, Without underlying injury pattern.  Echocardiogram: 06/19/2021: LVEF <20%, severely reduced systolic function, global hypokinesis, left ventricular size is dilated, right ventricular size is dilated, systolic function reduced, biatrial enlargement, mild MR, IVC dilated, estimated RAP 15 mmHg, Definity contrast was utilized LV thrombus not present (This is a personal read, it differs from the final report).   Stress Testing:  None   Heart Catheterization: Right dominant circulation, normal coronaries No angiographic evidence of coronary artery disease   RA: 16 mmHg RV: 69/11 mmHg, RVEDP 24 mmHg PA: 67/43 mmHg, mPAP 54 mmHg PCW: 27 mmHg   CO: 5.8 L/min CI: 2.3 L/min/m2   Patient went into SVT 3 times with  any minimal irritation of LV during left heart catheterization, requiring vagal maneuvers, adenosine 6 mg, 12 mg, with conversion to sinus rhythm   Conclusion: Decompensated nonischemic cardiomyopathy Consider dilated or arrhythmia induced cardiomyopathy  Severe pulmonary hypertension, WHO grp II   GDMT for HFrEF with continued workup for etiology evaluation  Cardiac MRI 06/22/2021 1.  Severe LV dilatation with severe systolic dysfunction (EF 20%)  2.  Moderate RV dilatation with severe systolic dysfunction (EF 27%)  3. RV insertion site late gadolinium enhancement, which is a nonspecific finding often seen in setting of elevated pulmonary pressures. No other LGE seen.   Bilateral lower extremity venous duplex: 06/19/2021 RIGHT:  - There is no evidence of deep vein thrombosis in the lower extremity.   - No cystic structure found in the popliteal fossa.     LEFT:  - Findings consistent with age indeterminate deep vein thrombosis involving the left gastrocnemius veins.  - No cystic structure found in the popliteal fossa.   LABORATORY DATA:    Latest Ref Rng & Units 06/21/2021    2:09 AM 06/20/2021    1:46 PM 06/20/2021    1:39 PM  CBC  WBC 4.0 - 10.5 K/uL  8.6     Hemoglobin 13.0 - 17.0 g/dL 09.3  81.8  29.9   Hematocrit 39.0 - 52.0 % 38.7  38.0  39.0   Platelets 150 - 400 K/uL 339          Latest Ref Rng & Units 06/23/2021    1:22 AM 06/22/2021    1:23 AM 06/21/2021    2:09 AM  CMP  Glucose 70 - 99 mg/dL 371  696  789   BUN 6 - 20 mg/dL 16  16  13    Creatinine 0.61 - 1.24 mg/dL  3.81  0.17   Sodium 135 - 145 mmol/L 137  137  141   Potassium 3.5 - 5.1 mmol/L 3.5  3.8  4.3   Chloride 98 - 111 mmol/L 93  97  102   CO2 22 - 32 mmol/L 29  27  28    Calcium 8.9 - 10.3 mg/dL 8.7  8.2  8.6   Total Protein 6.5 - 8.1 g/dL   6.7   Total Bilirubin 0.3 - 1.2 mg/dL   1.3   Alkaline Phos 38 - 126 U/L   71   AST 15 - 41 U/L   22   ALT 0 - 44 U/L   34     Lipid Panel      Component Value Date/Time   CHOL 78 06/21/2021 0209   TRIG 39 06/21/2021 0209   HDL 22 (L) 06/21/2021 0209   CHOLHDL 3.5 06/21/2021 0209   VLDL 8 06/21/2021 0209   LDLCALC 48 06/21/2021 0209   LDLDIRECT 51.4 06/21/2021 0209    No components found for: "NTPROBNP" No results for input(s): "PROBNP" in the last 8760 hours. Recent Labs    06/19/21 0045  TSH 1.260    BMP Recent Labs    06/21/21 0209 06/22/21 0123 06/23/21 0122  NA 141 137 137  K 4.3 3.8 3.5  CL 102 97* 93*  CO2 28 27 29   GLUCOSE 120* 134* 138*  BUN 13 16 16   CREATININE 0.91 0.82 0.89  CALCIUM 8.6* 8.2* 8.7*  GFRNONAA >60 >60 >60    HEMOGLOBIN A1C Lab Results  Component Value Date   HGBA1C 7.8 (H) 06/19/2021   MPG 177.16 06/19/2021    IMPRESSION:    ICD-10-CM   1. Chronic combined systolic and diastolic congestive heart failure (HCC)  I50.42 dapagliflozin propanediol (FARXIGA) 10 MG TABS tablet    losartan (COZAAR) 25 MG tablet    metoprolol succinate (TOPROL XL) 50 MG 24 hr tablet    spironolactone (ALDACTONE) 25 MG tablet    torsemide (DEMADEX) 10 MG tablet    Basic metabolic panel    Magnesium    Pro b natriuretic peptide (BNP)    2. Nonischemic cardiomyopathy (HCC)  I42.8 dapagliflozin propanediol (FARXIGA) 10 MG TABS tablet    losartan (COZAAR) 25 MG tablet    metoprolol succinate (TOPROL XL) 50 MG 24 hr tablet    spironolactone (ALDACTONE) 25 MG tablet    torsemide (DEMADEX) 10 MG tablet    Basic metabolic panel    Magnesium    Pro b natriuretic peptide (BNP)    3. Type 2 diabetes mellitus with hyperglycemia, without long-term current use of insulin (HCC)  E11.65     4. Deep vein thrombosis (DVT) of calf muscle vein of left lower extremity, unspecified chronicity (HCC)  I82.462        RECOMMENDATIONS: Jerry Saunders is a 51 y.o. African-American male whose past medical history and cardiac  risk factors include: Premature ventricular contractions, newly discovered type II diabetic,  newly discovered systolic and diastolic heart failure, nonischemic cardiomyopathy, obesity.  Chronic combined systolic and diastolic congestive heart failure (HCC) Stage C, NYHA class II. Has not been taking [place] secondary to no insurance coverage. Patient now has insurance as of August 2023 and is requesting refills on all his heart failure medications. Refilled: Torsemide, Aldactone, losartan, Farxiga, and uptitrated Toprol-XL to 50 mg p.o. daily. Patient is requested to have labs a week after being on the current medical regimen. Based on repeat labs will further uptitrate GDMT. Since he now has insurance and would like to see him closely to further uptitrate GDMT. Strict I's and O's, daily weights. Patient's weight remains stable since discharge.  Referred him to sleep medicine at last office visit-evaluation still pending. Further recommendations to follow  Nonischemic cardiomyopathy (HCC) See above  Type 2 diabetes mellitus with hyperglycemia, without long-term current use of insulin (HCC) Currently on Farxiga, losartan, statin therapy.  Unable to tolerate metformin due to GI symptoms. Patient still has to make an appointment with PCP for which he is waiting until August 2023. Educated importance of glycemic control.  Deep vein thrombosis (DVT) of calf muscle vein of left lower extremity, unspecified chronicity (HCC) Patient states that Eliquis is cost prohibitive. I will give him samples for 1 month until he is a chance to follow-up with his new PCP for reevaluation. We will defer long-term management to PCP.  FINAL MEDICATION LIST END OF ENCOUNTER: Meds ordered this encounter  Medications   dapagliflozin propanediol (FARXIGA) 10 MG TABS tablet    Sig: Take 1 tablet (10 mg total) by mouth daily.    Dispense:  90 tablet    Refill:  3   losartan (COZAAR) 25 MG tablet    Sig: Take 1 tablet (25 mg total) by mouth daily.    Dispense:  30 tablet    Refill:  0   metoprolol  succinate (TOPROL XL) 50 MG 24 hr tablet    Sig: Take 1 tablet (50 mg total) by mouth every morning. Take with or immediately following a meal.    Dispense:  90 tablet    Refill:  0   spironolactone (ALDACTONE) 25 MG tablet    Sig: Take 1 tablet (25 mg total) by mouth in the morning.    Dispense:  90 tablet    Refill:  0   torsemide (DEMADEX) 10 MG tablet    Sig: Take 1 tablet (10 mg total) by mouth daily.    Dispense:  90 tablet    Refill:  0    Medications Discontinued During This Encounter  Medication Reason   apixaban (ELIQUIS) 5 MG TABS tablet    metoprolol succinate (TOPROL-XL) 25 MG 24 hr tablet Dose change   losartan (COZAAR) 25 MG tablet Reorder   spironolactone (ALDACTONE) 25 MG tablet Reorder   torsemide (DEMADEX) 10 MG tablet Reorder   dapagliflozin propanediol (FARXIGA) 10 MG TABS tablet Reorder     Current Outpatient Medications:    apixaban (ELIQUIS) 5 MG TABS tablet, Take 1 tablet (5 mg total) by mouth 2 (two) times daily., Disp: 60 tablet, Rfl: 2   Artificial Tear Ointment (DRY EYES OP), Place 2 drops into both eyes 2 (two) times daily as needed (dry eyes)., Disp: , Rfl:    atorvastatin (LIPITOR) 20 MG tablet, Take 1 tablet (20 mg total) by mouth at bedtime., Disp: 90 tablet, Rfl: 0   dapagliflozin propanediol (FARXIGA)  10 MG TABS tablet, Take 1 tablet (10 mg total) by mouth daily., Disp: 90 tablet, Rfl: 3   losartan (COZAAR) 25 MG tablet, Take 1 tablet (25 mg total) by mouth daily., Disp: 30 tablet, Rfl: 0   metFORMIN (GLUCOPHAGE) 500 MG tablet, Take 1 tablet (500 mg total) by mouth 2 (two) times daily with a meal., Disp: 60 tablet, Rfl: 3   metoprolol succinate (TOPROL XL) 50 MG 24 hr tablet, Take 1 tablet (50 mg total) by mouth every morning. Take with or immediately following a meal., Disp: 90 tablet, Rfl: 0   spironolactone (ALDACTONE) 25 MG tablet, Take 1 tablet (25 mg total) by mouth in the morning., Disp: 90 tablet, Rfl: 0   torsemide (DEMADEX) 10 MG tablet,  Take 1 tablet (10 mg total) by mouth daily., Disp: 90 tablet, Rfl: 0  Orders Placed This Encounter  Procedures   Basic metabolic panel   Magnesium   Pro b natriuretic peptide (BNP)    There are no Patient Instructions on file for this visit.   --Continue cardiac medications as reconciled in final medication list. --Return in about 4 weeks (around 09/18/2021) for Follow up, heart failure management.. or sooner if needed. --Continue follow-up with your primary care physician regarding the management of your other chronic comorbid conditions.  Patient's questions and concerns were addressed to his satisfaction. He voices understanding of the instructions provided during this encounter.   This note was created using a voice recognition software as a result there may be grammatical errors inadvertently enclosed that do not reflect the nature of this encounter. Every attempt is made to correct such errors.  Tessa Lerner, [place], [facility]  Pager: [phone] Office: [phone]

## 2021-08-31 ENCOUNTER — Ambulatory Visit (INDEPENDENT_AMBULATORY_CARE_PROVIDER_SITE_OTHER): Payer: Self-pay | Admitting: Primary Care

## 2021-11-14 ENCOUNTER — Other Ambulatory Visit: Payer: Self-pay

## 2021-11-14 DIAGNOSIS — I428 Other cardiomyopathies: Secondary | ICD-10-CM

## 2021-11-14 DIAGNOSIS — I5042 Chronic combined systolic (congestive) and diastolic (congestive) heart failure: Secondary | ICD-10-CM

## 2021-11-14 MED ORDER — APIXABAN 5 MG PO TABS: 5.0000 mg | ORAL_TABLET | Freq: Two times a day (BID) | ORAL | 3 refills | Status: DC

## 2021-11-14 MED ORDER — DAPAGLIFLOZIN PROPANEDIOL 10 MG PO TABS: 10.0000 mg | ORAL_TABLET | Freq: Every day | ORAL | 3 refills | Status: DC

## 2022-01-18 ENCOUNTER — Ambulatory Visit: Payer: 59 | Admitting: Cardiology

## 2022-01-18 ENCOUNTER — Encounter: Payer: Self-pay | Admitting: Cardiology

## 2022-01-18 VITALS — BP 110/73 | HR 92 | Resp 18 | Ht 74.0 in | Wt 294.0 lb

## 2022-01-18 DIAGNOSIS — I428 Other cardiomyopathies: Secondary | ICD-10-CM

## 2022-01-18 DIAGNOSIS — I82462 Acute embolism and thrombosis of left calf muscular vein: Secondary | ICD-10-CM

## 2022-01-18 DIAGNOSIS — I5042 Chronic combined systolic (congestive) and diastolic (congestive) heart failure: Secondary | ICD-10-CM

## 2022-01-18 DIAGNOSIS — E1165 Type 2 diabetes mellitus with hyperglycemia: Secondary | ICD-10-CM

## 2022-01-18 MED ORDER — DAPAGLIFLOZIN PROPANEDIOL 10 MG PO TABS: 10.0000 mg | ORAL_TABLET | Freq: Every day | ORAL | 0 refills | Status: AC

## 2022-01-18 MED ORDER — SPIRONOLACTONE 25 MG PO TABS: 25.0000 mg | ORAL_TABLET | Freq: Every morning | ORAL | 0 refills | Status: DC

## 2022-01-18 MED ORDER — LOSARTAN POTASSIUM 25 MG PO TABS: 25.0000 mg | ORAL_TABLET | Freq: Every day | ORAL | 0 refills | Status: DC

## 2022-01-18 MED ORDER — TORSEMIDE 10 MG PO TABS: 10.0000 mg | ORAL_TABLET | Freq: Every morning | ORAL | 0 refills | Status: DC

## 2022-01-18 MED ORDER — METOPROLOL SUCCINATE ER 50 MG PO TB24: 50.0000 mg | ORAL_TABLET | Freq: Every morning | ORAL | 0 refills | Status: DC

## 2022-01-18 NOTE — Progress Notes (Signed)
ID:  Jerry Saunders, DOB May 17, 1970, MRN [id number]  PCP:  Merryl Hacker, No  Cardiologist:  Rex Kras, DO, FACC (established care 06/20/2021)  Date: 01/18/22 Last Office Visit: 08/21/2021  Chief Complaint  Patient presents with   Congestive Heart Failure    management   Follow-up    5 months    HPI  Jerry Saunders is a 52 y.o. African-American male whose past medical history and cardiovascular risk factors include: Premature ventricular contractions, newly discovered type II diabetic, newly discovered systolic and diastolic heart failure, nonischemic cardiomyopathy, obesity.  Hospitalized in June 2023 for congestive heart failure and was noted to have LVEF of <20% with grade 2 diastolic dysfunction.  He underwent angiography as well as cardiac MRI and medications were uptitrated.  However due to lack of insurance he was not able to afford appropriate GDMT.  At last office visit patient had regained insurance and medications were refilled and he was supposed to follow-up regularly to uptitrate GDMT.  However, he was lost to follow-up.  And he states that he is been out of medications for the last 3 weeks at least.  When asked why patient states that he had no refills.   His discharge weight was approximately 123 kg.  At the last office visit was 126 kg and now 133 kg (approximately 7 kg of weight gain).   He still has not seen sleep medicine for evaluation of sleep apnea and on establish care with PCP.  Patient states that his weight gain is likely secondary to dietary indiscretion (eating oily foods).  He denies anginal discomfort, orthopnea, PND, lower extremity swelling.  ALLERGIES: No Known Allergies  MEDICATION LIST PRIOR TO VISIT: Current Meds  Medication Sig   apixaban (ELIQUIS) 5 MG TABS tablet Take 1 tablet (5 mg total) by mouth 2 (two) times daily.   Artificial Tear Ointment (DRY EYES OP) Place 2 drops into both eyes 2 (two) times daily as needed (dry eyes).   [DISCONTINUED]  dapagliflozin propanediol (FARXIGA) 10 MG TABS tablet Take 1 tablet (10 mg total) by mouth daily.   [DISCONTINUED] torsemide (DEMADEX) 10 MG tablet Take 1 tablet (10 mg total) by mouth daily.     PAST MEDICAL HISTORY: Past Medical History:  Diagnosis Date   Cardiomyopathy ([place])    CHF (congestive heart failure) ([place])    Diabetes mellitus without complication ([place])    DVT (deep venous thrombosis) ([place])    Hyperlipidemia    Hypertension     PAST SURGICAL HISTORY: Past Surgical History:  Procedure Laterality Date   RIGHT/LEFT HEART CATH AND CORONARY ANGIOGRAPHY N/A 06/20/2021   Procedure: RIGHT/LEFT HEART CATH AND CORONARY ANGIOGRAPHY;  Surgeon: Nigel Mormon, MD;  Location: [facility];  Service: Cardiovascular;  Laterality: N/A;    FAMILY HISTORY: The patient family history is not on file.  SOCIAL HISTORY:  The patient  reports that he has never smoked. He does not have any smokeless tobacco history on file. He reports that he does not drink alcohol and does not use drugs.  REVIEW OF SYSTEMS: Review of Systems  Constitutional: Positive for weight gain.  Cardiovascular:  Negative for chest pain, dyspnea on exertion, leg swelling, near-syncope, orthopnea, palpitations, paroxysmal nocturnal dyspnea and syncope.  Respiratory:  Negative for shortness of breath.   Hematologic/Lymphatic: Negative for bleeding problem.  Neurological:  Negative for dizziness and light-headedness.    PHYSICAL EXAM:    01/18/2022    3:15 PM 08/21/2021    3:49 PM 07/17/2021  3:48 PM  Vitals with BMI  Height 6\' 2"  6\' 2"  6\' 2"   Weight 294 lbs 279 lbs 274 lbs 3 oz  BMI 37.73 35.81 35.19  Systolic 110 114  Diastolic 73 78 64  Pulse 92 98 83    Physical Exam  Constitutional: No distress.  Age appropriate, hemodynamically stable.   Neck: No JVD present.  Cardiovascular: Normal rate, regular rhythm, S1 normal, S2 normal, intact distal pulses and normal pulses. Exam reveals no  gallop, no S3 and no S4.  No murmur heard. Pulmonary/Chest: Effort normal and breath sounds normal. No stridor. He has no wheezes. He has no rales.  Abdominal: Soft. Bowel sounds are normal. He exhibits no distension. There is no abdominal tenderness.  Musculoskeletal:        General: No edema.     Cervical back: Neck supple.  Neurological: He is alert and oriented to person, place, and time. He has intact cranial nerves (2-12).  Skin: Skin is warm and moist.   CARDIAC DATABASE: EKG: 01/18/2022: Sinus rhythm, 94 bpm, left axis, left anterior fascicular block, LAE, TWI in the anterolateral leads suggestive of ischemia.  Echocardiogram: 06/19/2021: LVEF <20%, severely reduced systolic function, global hypokinesis, left ventricular size is dilated, right ventricular size is dilated, systolic function reduced, biatrial enlargement, mild MR, IVC dilated, estimated RAP 15 mmHg, Definity contrast was utilized LV thrombus not present (This is a personal read, it differs from the final report).   Stress Testing:  None   Heart Catheterization: Right dominant circulation, normal coronaries No angiographic evidence of coronary artery disease   RA: 16 mmHg RV: 69/11 mmHg, RVEDP 24 mmHg PA: 67/43 mmHg, mPAP 54 mmHg PCW: 27 mmHg   CO: 5.8 L/min CI: 2.3 L/min/m2   Patient went into SVT 3 times with any minimal irritation of LV during left heart catheterization, requiring vagal maneuvers, adenosine 6 mg, 12 mg, with conversion to sinus rhythm   Conclusion: Decompensated nonischemic cardiomyopathy Consider dilated or arrhythmia induced cardiomyopathy  Severe pulmonary hypertension, WHO grp II   GDMT for HFrEF with continued workup for etiology evaluation  Cardiac MRI 06/22/2021 1.  Severe LV dilatation with severe systolic dysfunction (EF 20%)  2.  Moderate RV dilatation with severe systolic dysfunction (EF 27%)  3. RV insertion site late gadolinium enhancement, which is a nonspecific finding  often seen in setting of elevated pulmonary pressures. No other LGE seen.   Bilateral lower extremity venous duplex: 06/19/2021 RIGHT:  - There is no evidence of deep vein thrombosis in the lower extremity.   - No cystic structure found in the popliteal fossa.     LEFT:  - Findings consistent with age indeterminate deep vein thrombosis involving the left gastrocnemius veins.  - No cystic structure found in the popliteal fossa.   LABORATORY DATA:    Latest Ref Rng & Units 06/21/2021    2:09 AM 06/20/2021    1:46 PM 06/20/2021    1:39 PM  CBC  WBC 4.0 - 10.5 K/uL 8.6     Hemoglobin 13.0 - 17.0 g/dL 06/23/2021  06/22/2021  06/22/2021   Hematocrit 39.0 - 52.0 % 38.7  38.0  39.0   Platelets 150 - 400 K/uL 339          Latest Ref Rng & Units 06/23/2021    1:22 AM 06/22/2021    1:23 AM 06/21/2021    2:09 AM  CMP  Glucose 70 - 99 mg/dL 06/25/2021  06/24/2021  06/23/2021   BUN 6 -  20 mg/dL 16  16  13    Creatinine 0.61 - 1.24 mg/dL  4.48  1.85   Sodium 135 - 145 mmol/L 137  137  141   Potassium 3.5 - 5.1 mmol/L 3.5  3.8  4.3   Chloride 98 - 111 mmol/L 93  97  102   CO2 22 - 32 mmol/L 29  27  28    Calcium 8.9 - 10.3 mg/dL 8.7  8.2  8.6   Total Protein 6.5 - 8.1 g/dL   6.7   Total Bilirubin 0.3 - 1.2 mg/dL   1.3   Alkaline Phos 38 - 126 U/L   71   AST 15 - 41 U/L   22   ALT 0 - 44 U/L   34     Lipid Panel     Component Value Date/Time   CHOL 78 06/21/2021 0209   TRIG 39 06/21/2021 0209   HDL 22 (L) 06/21/2021 0209   CHOLHDL 3.5 06/21/2021 0209   VLDL 8 06/21/2021 0209   LDLCALC 48 06/21/2021 0209   LDLDIRECT 51.4 06/21/2021 0209    No components found for: "NTPROBNP" No results for input(s): "PROBNP" in the last 8760 hours. Recent Labs    06/19/21 0045  TSH 1.260    BMP Recent Labs    06/21/21 0209 06/22/21 0123 06/23/21 0122  NA 141 137 137  K 4.3 3.8 3.5  CL 102 97* 93*  CO2 28 27 29   GLUCOSE 120* 134* 138*  BUN 13 16 16   CREATININE 0.91 0.82 0.89  CALCIUM 8.6* 8.2* 8.7*  GFRNONAA >60  >60 >60    HEMOGLOBIN A1C Lab Results  Component Value Date   HGBA1C 7.8 (H) 06/19/2021   MPG 177.16 06/19/2021    IMPRESSION:    ICD-10-CM   1. Chronic combined systolic and diastolic congestive heart failure (HCC)  I50.42 EKG 12-Lead    dapagliflozin propanediol (FARXIGA) 10 MG TABS tablet    spironolactone (ALDACTONE) 25 MG tablet    torsemide (DEMADEX) 10 MG tablet    losartan (COZAAR) 25 MG tablet    Pro b natriuretic peptide (BNP)    Magnesium    metoprolol succinate (TOPROL XL) 50 MG 24 hr tablet    CANCELED: Basic metabolic panel    2. Nonischemic cardiomyopathy (HCC)  I42.8 dapagliflozin propanediol (FARXIGA) 10 MG TABS tablet    spironolactone (ALDACTONE) 25 MG tablet    torsemide (DEMADEX) 10 MG tablet    losartan (COZAAR) 25 MG tablet    metoprolol succinate (TOPROL XL) 50 MG 24 hr tablet    Lipid Panel With LDL/HDL Ratio    LDL cholesterol, direct    CMP14+EGFR    Hemoglobin A1c    3. Type 2 diabetes mellitus with hyperglycemia, without long-term current use of insulin (HCC)  E11.65 LDL cholesterol, direct    CMP14+EGFR    Hemoglobin A1c    4. Deep vein thrombosis (DVT) of calf muscle vein of left lower extremity, unspecified chronicity (HCC)  I82.462 PCV LOWER VENOUS (BILATERAL)       RECOMMENDATIONS: Jerry Saunders is a 52 y.o. African-American male whose past medical history and cardiac risk factors include: Premature ventricular contractions, newly discovered type II diabetic, newly discovered systolic and diastolic heart failure, nonischemic cardiomyopathy, obesity.  Chronic combined systolic and diastolic congestive heart failure (HCC) Nonischemic cardiomyopathy (HCC) Stage C, NYHA class II Weight since last office visit: up 7kg (likely due to dietary indiscretion) Last hospitalization: June 2023 Last Echo: June 2023  LVEF reported to be <20%, see above for more details.  Medications reconciled.  Refilled: Farxiga, losartan, Toprol-XL,  spironolactone, torsemide Will check labs now (NT-proBNP, BMP, Mg level, Lipids) and a week later.  Educated him on the importance of compliance with medications as well as follow-through. I would like to get him on maximally tolerated GDMT and repeat an echocardiogram to reevaluate LVEF to see if he is a candidate for AICD for primary prevention for sudden cardiac death.  Patient understands the importance of medication compliance, follow-up. Recommend daily weight check, strict I/O's Fluid restriction to <2L per day, Na restriction < 2g per day.   Type 2 diabetes mellitus with hyperglycemia, without long-term current use of insulin (HCC) Refilled Farxiga, losartan. Have asked him to establish care with PCP for appropriate management of his diabetes. Check A1c  Deep vein thrombosis (DVT) of calf muscle vein of left lower extremity, unspecified chronicity ([place]) Initially diagnosed during his hospitalization in June 2023. He has been on anticoagulation for greater than 6 months. Has stopped taking Eliquis due to running out of prescription. Will order lower extremity venous duplex to reevaluate for DVT.  FINAL MEDICATION LIST END OF ENCOUNTER: Meds ordered this encounter  Medications   dapagliflozin propanediol (FARXIGA) 10 MG TABS tablet    Sig: Take 1 tablet (10 mg total) by mouth daily.    Dispense:  90 tablet    Refill:  0   spironolactone (ALDACTONE) 25 MG tablet    Sig: Take 1 tablet (25 mg total) by mouth in the morning.    Dispense:  90 tablet    Refill:  0   torsemide (DEMADEX) 10 MG tablet    Sig: Take 1 tablet (10 mg total) by mouth every morning.    Dispense:  30 tablet    Refill:  0   losartan (COZAAR) 25 MG tablet    Sig: Take 1 tablet (25 mg total) by mouth daily at 10 pm.    Dispense:  30 tablet    Refill:  0   metoprolol succinate (TOPROL XL) 50 MG 24 hr tablet    Sig: Take 1 tablet (50 mg total) by mouth every morning. Hold if systolic blood pressure (top  number) less than 100 mmHg or pulse less than 60 bpm.    Dispense:  90 tablet    Refill:  0    Medications Discontinued During This Encounter  Medication Reason   atorvastatin (LIPITOR) 20 MG tablet Patient Preference   metFORMIN (GLUCOPHAGE) 500 MG tablet Patient Preference   metoprolol succinate (TOPROL XL) 50 MG 24 hr tablet Patient Preference   spironolactone (ALDACTONE) 25 MG tablet Reorder   torsemide (DEMADEX) 10 MG tablet Reorder   dapagliflozin propanediol (FARXIGA) 10 MG TABS tablet Reorder   losartan (COZAAR) 25 MG tablet Reorder     Current Outpatient Medications:    apixaban (ELIQUIS) 5 MG TABS tablet, Take 1 tablet (5 mg total) by mouth 2 (two) times daily., Disp: 180 tablet, Rfl: 3   Artificial Tear Ointment (DRY EYES OP), Place 2 drops into both eyes 2 (two) times daily as needed (dry eyes)., Disp: , Rfl:    dapagliflozin propanediol (FARXIGA) 10 MG TABS tablet, Take 1 tablet (10 mg total) by mouth daily., Disp: 90 tablet, Rfl: 0   losartan (COZAAR) 25 MG tablet, Take 1 tablet (25 mg total) by mouth daily at 10 pm., Disp: 30 tablet, Rfl: 0   metoprolol succinate (TOPROL XL) 50 MG 24 hr tablet, Take 1  tablet (50 mg total) by mouth every morning. Hold if systolic blood pressure (top number) less than 100 mmHg or pulse less than 60 bpm., Disp: 90 tablet, Rfl: 0   spironolactone (ALDACTONE) 25 MG tablet, Take 1 tablet (25 mg total) by mouth in the morning., Disp: 90 tablet, Rfl: 0   torsemide (DEMADEX) 10 MG tablet, Take 1 tablet (10 mg total) by mouth every morning., Disp: 30 tablet, Rfl: 0  Orders Placed This Encounter  Procedures   Pro b natriuretic peptide (BNP)   Magnesium   Lipid Panel With LDL/HDL Ratio   LDL cholesterol, direct   CNO70+JGGE   Hemoglobin A1c   EKG 12-Lead   PCV LOWER VENOUS US (BILATERAL)    There are no Patient Instructions on file for this visit.   --Continue cardiac medications as reconciled in final medication list. --Return in about 3  weeks (around 02/08/2022) for Follow up, heart failure management.. or sooner if needed. --Continue follow-up with your primary care physician regarding the management of your other chronic comorbid conditions.  Patient's questions and concerns were addressed to his satisfaction. He voices understanding of the instructions provided during this encounter.   This note was created using a voice recognition software as a result there may be grammatical errors inadvertently enclosed that do not reflect the nature of this encounter. Every attempt is made to correct such errors.  Tessa Lerner, [place], [facility]  Pager: [phone] Office: [phone]

## 2022-01-19 ENCOUNTER — Ambulatory Visit: Payer: 59

## 2022-01-19 DIAGNOSIS — I82462 Acute embolism and thrombosis of left calf muscular vein: Secondary | ICD-10-CM

## 2022-01-20 LAB — CMP14+EGFR
ALT: 13 IU/L (ref 0–44)
AST: 14 IU/L (ref 0–40)
Albumin/Globulin Ratio: 1.4 (ref 1.2–2.2)
Albumin: 4.2 g/dL (ref 4.1–5.1)
Alkaline Phosphatase: 95 IU/L (ref 44–121)
BUN/Creatinine Ratio: 14 (ref 9–20)
BUN: 13 mg/dL (ref 6–24)
Bilirubin Total: 0.4 mg/dL (ref 0.0–1.2)
CO2: 24 mmol/L (ref 20–29)
Calcium: 8.9 mg/dL (ref 8.7–10.2)
Chloride: 101 mmol/L (ref 96–106)
Creatinine, Ser: 0.92 mg/dL (ref 0.76–1.27)
Globulin, Total: 3 g/dL (ref 1.5–4.5)
Glucose: 100 mg/dL — ABNORMAL HIGH (ref 70–99)
Potassium: 4.3 mmol/L (ref 3.5–5.2)
Sodium: 140 mmol/L (ref 134–144)
Total Protein: 7.2 g/dL (ref 6.0–8.5)
eGFR: 101 mL/min/{1.73_m2} (ref >59)

## 2022-01-20 LAB — LIPID PANEL WITH LDL/HDL RATIO
Cholesterol, Total: 116 mg/dL (ref 100–199)
HDL: 44 mg/dL (ref >39)
LDL Chol Calc (NIH): 58 mg/dL (ref 0–99)
LDL/HDL Ratio: 1.3 ratio (ref 0.0–3.6)
Triglycerides: 64 mg/dL (ref 0–149)
VLDL Cholesterol Cal: 14 mg/dL (ref 5–40)

## 2022-01-20 LAB — LDL CHOLESTEROL, DIRECT: LDL Direct: 60 mg/dL (ref 0–99)

## 2022-01-20 LAB — HEMOGLOBIN A1C
Est. average glucose Bld gHb Est-mCnc: 154 mg/dL
Hgb A1c MFr Bld: 7 % — ABNORMAL HIGH (ref 4.8–5.6)

## 2022-01-20 LAB — PRO B NATRIURETIC PEPTIDE: NT-Pro BNP: 540 pg/mL — ABNORMAL HIGH (ref 0–121)

## 2022-01-20 LAB — MAGNESIUM: Magnesium: 2.2 mg/dL (ref 1.6–2.3)

## 2022-01-22 NOTE — Progress Notes (Signed)
Spoke with patient about results, he acknowledged understanding. He will also stop taking the Eliquis today.

## 2022-01-23 ENCOUNTER — Other Ambulatory Visit: Payer: Self-pay

## 2022-01-23 DIAGNOSIS — I5020 Unspecified systolic (congestive) heart failure: Secondary | ICD-10-CM

## 2022-01-23 MED ORDER — ENTRESTO 49-51 MG PO TABS: 1.0000 | ORAL_TABLET | Freq: Two times a day (BID) | ORAL | 3 refills | Status: DC

## 2022-01-23 NOTE — Progress Notes (Signed)
Spoke with patient, he agreed to stop losartan and begin entresto. He will follow up with labs a week after beginning entresto.

## 2022-01-30 ENCOUNTER — Other Ambulatory Visit: Payer: Self-pay

## 2022-01-30 MED ORDER — ENTRESTO 49-51 MG PO TABS: 1.0000 | ORAL_TABLET | Freq: Two times a day (BID) | ORAL | 3 refills | Status: DC

## 2022-02-08 ENCOUNTER — Ambulatory Visit: Payer: 59 | Admitting: Cardiology

## 2022-02-14 NOTE — Progress Notes (Signed)
Called patient, VM was full. Could not leave VM

## 2022-02-16 NOTE — Progress Notes (Signed)
Called patient, VM was still full.

## 2022-02-22 ENCOUNTER — Ambulatory Visit: Payer: 59 | Admitting: Cardiology

## 2022-02-23 NOTE — Progress Notes (Signed)
Spoke with patient and he said he will go next week and have labs drawn. Are they all the same labs as ordered at last appointment?

## 2022-03-05 ENCOUNTER — Other Ambulatory Visit: Payer: Self-pay

## 2022-03-05 DIAGNOSIS — I428 Other cardiomyopathies: Secondary | ICD-10-CM

## 2022-03-05 DIAGNOSIS — I5020 Unspecified systolic (congestive) heart failure: Secondary | ICD-10-CM

## 2022-03-05 DIAGNOSIS — I5042 Chronic combined systolic (congestive) and diastolic (congestive) heart failure: Secondary | ICD-10-CM

## 2022-03-05 NOTE — Progress Notes (Signed)
Called patient no answer could not leave a vm due to full vm. Schedule has been changed and labs as been sent

## 2022-03-06 ENCOUNTER — Other Ambulatory Visit: Payer: Self-pay

## 2022-03-06 DIAGNOSIS — I5020 Unspecified systolic (congestive) heart failure: Secondary | ICD-10-CM

## 2022-03-06 NOTE — Progress Notes (Signed)
Ordered and released labs, called patient and got no answer. VM was full, unable to leave VM

## 2022-03-13 ENCOUNTER — Ambulatory Visit: Payer: 59 | Admitting: Cardiology

## 2022-03-15 NOTE — Progress Notes (Signed)
3rd attempt : Called patient, NA, LMAM

## 2022-03-15 NOTE — Progress Notes (Signed)
Called and left another VM on his phone and his spouses phone. Will call again later today.

## 2022-03-15 NOTE — Progress Notes (Signed)
Called and left another VM to call back.

## 2022-03-20 NOTE — Progress Notes (Signed)
Called and left another VM to call back.

## 2022-03-22 NOTE — Progress Notes (Signed)
Called and left another message for patient to call back. We have tried calling him and his wife with no luck.

## 2022-03-23 NOTE — Progress Notes (Signed)
Called patient again, VM is full and couldn't leave another VM.

## 2022-04-04 NOTE — Progress Notes (Signed)
Called patient again, and VM is too full to leave a message. Do we continue calling him?

## 2022-06-30 ENCOUNTER — Emergency Department (HOSPITAL_COMMUNITY): Payer: 59

## 2022-06-30 ENCOUNTER — Inpatient Hospital Stay (HOSPITAL_COMMUNITY)
Admission: EM | Admit: 2022-06-30 | Discharge: 2022-07-07 | DRG: 175 | Disposition: A | Discharge status: Home / Self Care | Payer: 59 | Attending: Internal Medicine | Admitting: Internal Medicine

## 2022-06-30 ENCOUNTER — Other Ambulatory Visit: Payer: Self-pay

## 2022-06-30 ENCOUNTER — Encounter (HOSPITAL_COMMUNITY): Payer: Self-pay | Admitting: *Deleted

## 2022-06-30 DIAGNOSIS — I2699 Other pulmonary embolism without acute cor pulmonale: Secondary | ICD-10-CM | POA: Diagnosis not present

## 2022-06-30 DIAGNOSIS — E785 Hyperlipidemia, unspecified: Secondary | ICD-10-CM | POA: Diagnosis present

## 2022-06-30 DIAGNOSIS — Z597 Insufficient social insurance and welfare support: Secondary | ICD-10-CM

## 2022-06-30 DIAGNOSIS — D638 Anemia in other chronic diseases classified elsewhere: Secondary | ICD-10-CM | POA: Diagnosis present

## 2022-06-30 DIAGNOSIS — I110 Hypertensive heart disease with heart failure: Secondary | ICD-10-CM | POA: Diagnosis present

## 2022-06-30 DIAGNOSIS — Z6835 Body mass index (BMI) 35.0-35.9, adult: Secondary | ICD-10-CM

## 2022-06-30 DIAGNOSIS — I4892 Unspecified atrial flutter: Secondary | ICD-10-CM

## 2022-06-30 DIAGNOSIS — I513 Intracardiac thrombosis, not elsewhere classified: Secondary | ICD-10-CM

## 2022-06-30 DIAGNOSIS — I2722 Pulmonary hypertension due to left heart disease: Secondary | ICD-10-CM | POA: Diagnosis present

## 2022-06-30 DIAGNOSIS — R911 Solitary pulmonary nodule: Secondary | ICD-10-CM | POA: Diagnosis present

## 2022-06-30 DIAGNOSIS — R570 Cardiogenic shock: Secondary | ICD-10-CM

## 2022-06-30 DIAGNOSIS — R579 Shock, unspecified: Secondary | ICD-10-CM | POA: Diagnosis not present

## 2022-06-30 DIAGNOSIS — I5082 Biventricular heart failure: Secondary | ICD-10-CM | POA: Diagnosis present

## 2022-06-30 DIAGNOSIS — R509 Fever, unspecified: Secondary | ICD-10-CM

## 2022-06-30 DIAGNOSIS — E871 Hypo-osmolality and hyponatremia: Secondary | ICD-10-CM | POA: Diagnosis present

## 2022-06-30 DIAGNOSIS — I4729 Other ventricular tachycardia: Secondary | ICD-10-CM

## 2022-06-30 DIAGNOSIS — I493 Ventricular premature depolarization: Secondary | ICD-10-CM

## 2022-06-30 DIAGNOSIS — I2609 Other pulmonary embolism with acute cor pulmonale: Secondary | ICD-10-CM | POA: Diagnosis not present

## 2022-06-30 DIAGNOSIS — I480 Paroxysmal atrial fibrillation: Secondary | ICD-10-CM | POA: Diagnosis present

## 2022-06-30 DIAGNOSIS — I340 Nonrheumatic mitral (valve) insufficiency: Secondary | ICD-10-CM | POA: Diagnosis present

## 2022-06-30 DIAGNOSIS — I071 Rheumatic tricuspid insufficiency: Secondary | ICD-10-CM | POA: Diagnosis present

## 2022-06-30 DIAGNOSIS — Z7901 Long term (current) use of anticoagulants: Secondary | ICD-10-CM

## 2022-06-30 DIAGNOSIS — Z91199 Patient's noncompliance with other medical treatment and regimen due to unspecified reason: Secondary | ICD-10-CM

## 2022-06-30 DIAGNOSIS — I5043 Acute on chronic combined systolic (congestive) and diastolic (congestive) heart failure: Secondary | ICD-10-CM

## 2022-06-30 DIAGNOSIS — Z7984 Long term (current) use of oral hypoglycemic drugs: Secondary | ICD-10-CM

## 2022-06-30 DIAGNOSIS — E119 Type 2 diabetes mellitus without complications: Secondary | ICD-10-CM

## 2022-06-30 DIAGNOSIS — R55 Syncope and collapse: Secondary | ICD-10-CM

## 2022-06-30 DIAGNOSIS — I509 Heart failure, unspecified: Secondary | ICD-10-CM

## 2022-06-30 DIAGNOSIS — E876 Hypokalemia: Secondary | ICD-10-CM | POA: Diagnosis present

## 2022-06-30 DIAGNOSIS — I2489 Other forms of acute ischemic heart disease: Secondary | ICD-10-CM | POA: Diagnosis present

## 2022-06-30 DIAGNOSIS — R7989 Other specified abnormal findings of blood chemistry: Secondary | ICD-10-CM

## 2022-06-30 DIAGNOSIS — I272 Pulmonary hypertension, unspecified: Secondary | ICD-10-CM

## 2022-06-30 DIAGNOSIS — R578 Other shock: Secondary | ICD-10-CM | POA: Diagnosis present

## 2022-06-30 DIAGNOSIS — I471 Supraventricular tachycardia, unspecified: Secondary | ICD-10-CM | POA: Diagnosis not present

## 2022-06-30 DIAGNOSIS — I50814 Right heart failure due to left heart failure: Secondary | ICD-10-CM

## 2022-06-30 DIAGNOSIS — Z1152 Encounter for screening for COVID-19: Secondary | ICD-10-CM

## 2022-06-30 DIAGNOSIS — Z91148 Patient's other noncompliance with medication regimen for other reason: Secondary | ICD-10-CM

## 2022-06-30 DIAGNOSIS — G4733 Obstructive sleep apnea (adult) (pediatric): Secondary | ICD-10-CM | POA: Diagnosis present

## 2022-06-30 DIAGNOSIS — J9601 Acute respiratory failure with hypoxia: Secondary | ICD-10-CM | POA: Diagnosis present

## 2022-06-30 DIAGNOSIS — I428 Other cardiomyopathies: Secondary | ICD-10-CM | POA: Diagnosis present

## 2022-06-30 DIAGNOSIS — I5023 Acute on chronic systolic (congestive) heart failure: Secondary | ICD-10-CM | POA: Diagnosis present

## 2022-06-30 DIAGNOSIS — K7200 Acute and subacute hepatic failure without coma: Secondary | ICD-10-CM | POA: Diagnosis present

## 2022-06-30 DIAGNOSIS — Z79899 Other long term (current) drug therapy: Secondary | ICD-10-CM

## 2022-06-30 DIAGNOSIS — Z86718 Personal history of other venous thrombosis and embolism: Secondary | ICD-10-CM

## 2022-06-30 DIAGNOSIS — Z86711 Personal history of pulmonary embolism: Secondary | ICD-10-CM

## 2022-06-30 LAB — CBC
HCT: 35.9 % — ABNORMAL LOW (ref 39.0–52.0)
Hemoglobin: 11.5 g/dL — ABNORMAL LOW (ref 13.0–17.0)
MCH: 27.4 pg (ref 26.0–34.0)
MCHC: 32 g/dL (ref 30.0–36.0)
MCV: 85.5 fL (ref 80.0–100.0)
Platelets: 239 10*3/uL (ref 150–400)
RBC: 4.2 MIL/uL — ABNORMAL LOW (ref 4.22–5.81)
RDW: 15.1 % (ref 11.5–15.5)
WBC: 9.4 10*3/uL (ref 4.0–10.5)
nRBC: 0 % (ref 0.0–0.2)

## 2022-06-30 LAB — URINALYSIS, W/ REFLEX TO CULTURE (INFECTION SUSPECTED)
Bilirubin Urine: NEGATIVE
Glucose, UA: NEGATIVE mg/dL
Ketones, ur: NEGATIVE mg/dL
Leukocytes,Ua: NEGATIVE
Nitrite: NEGATIVE
Protein, ur: 30 mg/dL — AB
Specific Gravity, Urine: 1.024 (ref 1.005–1.030)
pH: 5 (ref 5.0–8.0)

## 2022-06-30 LAB — BASIC METABOLIC PANEL
Anion gap: 11 (ref 5–15)
BUN: 31 mg/dL — ABNORMAL HIGH (ref 6–20)
CO2: 24 mmol/L (ref 22–32)
Calcium: 8 mg/dL — ABNORMAL LOW (ref 8.9–10.3)
Chloride: 98 mmol/L (ref 98–111)
Creatinine, Ser: 1.21 mg/dL (ref 0.61–1.24)
GFR, Estimated: >60 mL/min (ref >60)
Glucose, Bld: 139 mg/dL — ABNORMAL HIGH (ref 70–99)
Potassium: 3.6 mmol/L (ref 3.5–5.1)
Sodium: 133 mmol/L — ABNORMAL LOW (ref 135–145)

## 2022-06-30 LAB — BRAIN NATRIURETIC PEPTIDE: B Natriuretic Peptide: 2206.2 pg/mL — ABNORMAL HIGH (ref 0.0–100.0)

## 2022-06-30 LAB — SARS CORONAVIRUS 2 BY RT PCR: SARS Coronavirus 2 by RT PCR: NEGATIVE

## 2022-06-30 LAB — TROPONIN I (HIGH SENSITIVITY)
Troponin I (High Sensitivity): 72 ng/L — ABNORMAL HIGH (ref <18)
Troponin I (High Sensitivity): 72 ng/L — ABNORMAL HIGH (ref <18)

## 2022-06-30 LAB — LACTIC ACID, PLASMA: Lactic Acid, Venous: 1.8 mmol/L (ref 0.5–1.9)

## 2022-06-30 MED ORDER — NOREPINEPHRINE 4 MG/250ML-% IV SOLN
2.0000 ug/min | INTRAVENOUS | Status: DC
  Administered 2022-06-30: 2 ug/min via INTRAVENOUS
  Administered 2022-07-01: 5 ug/min via INTRAVENOUS
  Administered 2022-07-01: 2 ug/min via INTRAVENOUS
  Administered 2022-07-02 – 2022-07-03 (×2): 3 ug/min via INTRAVENOUS
  Administered 2022-07-04: 4 ug/min via INTRAVENOUS
  Filled 2022-06-30 (×5): qty 250

## 2022-06-30 MED ORDER — SODIUM CHLORIDE 0.9 % IV BOLUS
500.0000 mL | Freq: Once | INTRAVENOUS | Status: AC
  Administered 2022-06-30: 500 mL via INTRAVENOUS

## 2022-06-30 MED ORDER — MORPHINE SULFATE (PF) 4 MG/ML IV SOLN: 4.0000 mg | Freq: Once | INTRAVENOUS | Status: DC

## 2022-06-30 MED ORDER — ACETAMINOPHEN 325 MG PO TABS
650.0000 mg | ORAL_TABLET | Freq: Once | ORAL | Status: AC
  Administered 2022-06-30: 650 mg via ORAL
  Filled 2022-06-30: qty 2

## 2022-06-30 MED ORDER — SODIUM CHLORIDE 0.9 % IV SOLN
1.0000 g | Freq: Once | INTRAVENOUS | Status: AC
  Administered 2022-06-30: 1 g via INTRAVENOUS
  Filled 2022-06-30: qty 10

## 2022-06-30 MED ORDER — HEPARIN BOLUS VIA INFUSION
4000.0000 [IU] | Freq: Once | INTRAVENOUS | Status: AC
  Administered 2022-06-30: 4000 [IU] via INTRAVENOUS
  Filled 2022-06-30: qty 4000

## 2022-06-30 MED ORDER — POTASSIUM CHLORIDE CRYS ER 20 MEQ PO TBCR
40.0000 meq | EXTENDED_RELEASE_TABLET | Freq: Once | ORAL | Status: AC
  Administered 2022-06-30: 40 meq via ORAL
  Filled 2022-06-30: qty 2

## 2022-06-30 MED ORDER — FUROSEMIDE 10 MG/ML IJ SOLN
40.0000 mg | Freq: Once | INTRAMUSCULAR | Status: DC
  Filled 2022-06-30: qty 4

## 2022-06-30 MED ORDER — IOHEXOL 350 MG/ML SOLN
75.0000 mL | Freq: Once | INTRAVENOUS | Status: AC | PRN
  Administered 2022-06-30: 75 mL via INTRAVENOUS

## 2022-06-30 MED ORDER — SODIUM CHLORIDE 0.9 % IV SOLN
250.0000 mL | INTRAVENOUS | Status: DC
  Administered 2022-06-30: 250 mL via INTRAVENOUS

## 2022-06-30 MED ORDER — SODIUM CHLORIDE 0.9 % IV SOLN
500.0000 mg | Freq: Once | INTRAVENOUS | Status: AC
  Administered 2022-06-30: 500 mg via INTRAVENOUS
  Filled 2022-06-30: qty 5

## 2022-06-30 MED ORDER — HEPARIN (PORCINE) 25000 UT/250ML-% IV SOLN
2000.0000 [IU]/h | INTRAVENOUS | Status: DC
  Administered 2022-06-30: 2000 [IU]/h via INTRAVENOUS
  Filled 2022-06-30: qty 250

## 2022-06-30 NOTE — H&P (Incomplete)
NAME:  Yakub Lodes, MRN:  [id number], DOB:  [birth year], LOS: 0 ADMISSION DATE:  06/30/2022, CONSULTATION DATE:  07/01/22 REFERRING MD:  Lynelle Doctor - EM, CHIEF COMPLAINT:  SOB   History of Present Illness:  52 yo M PMH NICM EF <15% followed by [facility], Hx DVT noncompliant w DOAC, LV thrombus, DM2, Severe pHTN WHO group II, who presented to ED 6/23 with SOB and fatigue x a few weeks, some associated leg sw   Admitted to [facility] 4/14-4/18 with decomp HFrEF + DVT  Pertinent  Medical History  ***  Significant Hospital Events: Including procedures, antibiotic start and stop dates in addition to other pertinent events     Interim History / Subjective:  ***  Objective   Blood pressure (!) 92/47, pulse (!) 112, temperature 99.1 F (37.3 C), temperature source Oral, resp. rate (!) 21, height 6\' 2"  (1.88 m), weight 133.4 kg, SpO2 98 %.        Intake/Output Summary (Last 24 hours) at 06/30/2022 2348 Last data filed at 06/30/2022 2212 Gross per 24 hour  Intake 500.74 ml  Output --  Net 500.74 ml   Filed Weights   06/30/22 1806  Weight: 133.4 kg    Examination: General: *** HENT: *** Lungs: *** Cardiovascular: *** Abdomen: *** Extremities: *** Neuro: *** GU: ***  Resolved Hospital Problem list   ***  Assessment & Plan:  ***  Best Practice (right click and "Reselect all SmartList Selections" daily)   Diet/type: {diet type:25684} DVT prophylaxis: {anticoagulation (Optional):25687} GI prophylaxis: {WU:98119} Lines: {Central Venous Access:25771} Foley:  {Central Venous Access:25691} Code Status:  {Code Status:26939} Last date of multidisciplinary goals of care discussion [***]  Labs   CBC: Recent Labs  Lab 06/30/22 1810  WBC 9.4  HGB 11.5*  HCT 35.9*  MCV 85.5  PLT 239    Basic Metabolic Panel: Recent Labs  Lab 06/30/22 1810  NA 133*  K 3.6  CL 98  CO2 24  GLUCOSE 139*  BUN 31*  CREATININE 1.21  CALCIUM 8.0*   GFR: Estimated Creatinine Clearance: 106.1 mL/min  (by C-G formula based on SCr of 1.21 mg/dL). Recent Labs  Lab 06/30/22 1810 06/30/22 1811  WBC 9.4  --   LATICACIDVEN  --  1.8    Liver Function Tests: No results for input(s): "AST", "ALT", "ALKPHOS", "BILITOT", "PROT", "ALBUMIN" in the last 168 hours. No results for input(s): "LIPASE", "AMYLASE" in the last 168 hours. No results for input(s): "AMMONIA" in the last 168 hours.  ABG    Component Value Date/Time   PHART 7.459 (H) 06/20/2021 1346   PCO2ART 43.6 06/20/2021 1346   PO2ART 84 06/20/2021 1346   HCO3 30.9 (H) 06/20/2021 1346   TCO2 32 06/20/2021 1346   O2SAT 97 06/20/2021 1346     Coagulation Profile: No results for input(s): "INR", "PROTIME" in the last 168 hours.  Cardiac Enzymes: No results for input(s): "CKTOTAL", "CKMB", "CKMBINDEX", "TROPONINI" in the last 168 hours.  HbA1C: Hgb A1c MFr Bld  Date/Time Value Ref Range Status  01/19/2022 03:54 PM 7.0 (H) 4.8 - 5.6 % Final    Comment:             Prediabetes: 5.7 - 6.4          Diabetes: >6.4          Glycemic control for adults with diabetes: <7.0   06/19/2021 12:45 AM 7.8 (H) 4.8 - 5.6 % Final    Comment:    (NOTE) Pre diabetes:  5.7%-6.4%  Diabetes:              >6.4%  Glycemic control for   <7.0% adults with diabetes     CBG: No results for input(s): "GLUCAP" in the last 168 hours.  Review of Systems:   ***  Past Medical History:  He,  has a past medical history of Cardiomyopathy (HCC), CHF (congestive heart failure) (HCC), Diabetes mellitus without complication (HCC), DVT (deep venous thrombosis) (HCC), Hyperlipidemia, and Hypertension.   Surgical History:   Past Surgical History:  Procedure Laterality Date  . RIGHT/LEFT HEART CATH AND CORONARY ANGIOGRAPHY N/A 06/20/2021   Procedure: RIGHT/LEFT HEART CATH AND CORONARY ANGIOGRAPHY;  Surgeon: Elder Negus, MD;  Location: [facility];  Service: Cardiovascular;  Laterality: N/A;     Social History:   reports that  he has never smoked. He does not have any smokeless tobacco history on file. He reports that he does not drink alcohol and does not use drugs.   Family History:  His family history is not on file.   Allergies No Known Allergies   Home Medications  Prior to Admission medications   Medication Sig Start Date End Date Taking? Authorizing Provider  apixaban (ELIQUIS) 5 MG TABS tablet Take 1 tablet (5 mg total) by mouth 2 (two) times daily. 11/14/21  Yes Tolia, Sunit, DO  Artificial Tear Ointment (DRY EYES OP) Place 2 drops into both eyes 2 (two) times daily as needed (dry eyes).    [provider]  losartan (COZAAR) 25 MG tablet Take 1 tablet (25 mg total) by mouth daily at 10 pm. 01/18/22 02/17/22  Tolia, Sunit, DO  metoprolol succinate (TOPROL XL) 50 MG 24 hr tablet Take 1 tablet (50 mg total) by mouth every morning. Hold if systolic blood pressure (top number) less than 100 mmHg or pulse less than 60 bpm. 01/18/22 04/18/22  Tolia, Sunit, DO  sacubitril-valsartan (ENTRESTO) 49-51 MG Take 1 tablet by mouth 2 (two) times daily. 01/30/22   Tolia, Sunit, DO  spironolactone (ALDACTONE) 25 MG tablet Take 1 tablet (25 mg total) by mouth in the morning. 01/18/22 04/18/22  Tolia, Sunit, DO  torsemide (DEMADEX) 10 MG tablet Take 1 tablet (10 mg total) by mouth every morning. 01/18/22 02/17/22  Tessa Lerner, DO     Critical care time: ***

## 2022-06-30 NOTE — Progress Notes (Signed)
ANTICOAGULATION CONSULT NOTE - Initial Consult  Pharmacy Consult for heparin Indication: pulmonary embolus  No Known Allergies  Patient Measurements: Height: 6\' 2"  (188 cm) Weight: 133.4 kg (294 lb 1.5 oz) IBW/kg (Calculated) : 82.2 Heparin Dosing Weight: 112 Kg  Vital Signs: Temp: 99.1 F (37.3 C) (06/22 2212) Temp Source: Oral (06/22 2212) BP: 104/72 (06/22 2230) Pulse Rate: 102 (06/22 2145)  Labs: Recent Labs    06/30/22 1810 06/30/22 2010  HGB 11.5*  --   HCT 35.9*  --   PLT 239  --   CREATININE 1.21  --   TROPONINIHS 72* 72*    Estimated Creatinine Clearance: 106.1 mL/min (by C-G formula based on SCr of 1.21 mg/dL).   Medical History: Past Medical History:  Diagnosis Date   Cardiomyopathy (HCC)    CHF (congestive heart failure) (HCC)    Diabetes mellitus without complication (HCC)    DVT (deep venous thrombosis) (HCC)    Hyperlipidemia    Hypertension      Assessment: 64 yoM with history of DVT presenting to the ED with increased leg swelling and SOB found to have new PE. Patient was previously taking Eliquis, but has NOT had it refilled in atleast a month. Hgb 11.5, PLT WNL. Pharmacy consulted to dose heparin. Of note patient was just admitted on 6/13 and found to have DVT. At that time he was instructed to take Eliquis, but no prescription insurance on file.    Goal of Therapy:  Heparin level 0.3-0.7 units/ml Monitor platelets by anticoagulation protocol: Yes   Plan:  Give 4000 units bolus x 1 Start heparin infusion at 1900 units/hr (~17 units/kg/hr) Check anti-Xa level in 6 hours and daily while on heparin Continue to monitor H&H and platelets  Ruben Im, PharmD Clinical Pharmacist 06/30/2022 11:15 PM Please check AMION for all [facility] Pharmacy numbers

## 2022-06-30 NOTE — Progress Notes (Signed)
ANTICOAGULATION CONSULT NOTE - Initial Consult  Pharmacy Consult for heparin Indication: pulmonary embolus  No Known Allergies  Patient Measurements: Height: 6\' 2"  (188 cm) Weight: 133.4 kg (294 lb 1.5 oz) IBW/kg (Calculated) : 82.2 Heparin Dosing Weight: 112 Kg  Vital Signs: Temp: 99.1 F (37.3 C) (06/22 2212) Temp Source: Oral (06/22 2212) BP: 104/72 (06/22 2230) Pulse Rate: 102 (06/22 2145)  Labs: Recent Labs    06/30/22 1810 06/30/22 2010  HGB 11.5*  --   HCT 35.9*  --   PLT 239  --   CREATININE 1.21  --   TROPONINIHS 72* 72*     Estimated Creatinine Clearance: 106.1 mL/min (by C-G formula based on SCr of 1.21 mg/dL).   Medical History: Past Medical History:  Diagnosis Date   Cardiomyopathy (HCC)    CHF (congestive heart failure) (HCC)    Diabetes mellitus without complication (HCC)    DVT (deep venous thrombosis) (HCC)    Hyperlipidemia    Hypertension      Assessment: 45 yoM with history of DVT presenting to the ED with increased leg swelling and SOB found to have new PE. Patient was previously taking Eliquis, but has NOT had it refilled in atleast a month. Hgb 11.5, PLT WNL. Pharmacy consulted to dose heparin. Of note patient was just admitted on 6/13 and found to have DVT. At that time he was instructed to take Eliquis, but no prescription insurance on file.    Goal of Therapy:  Heparin level 0.3-0.7 units/ml Monitor platelets by anticoagulation protocol: Yes   Plan:  Give 4000 units bolus x 1 Start heparin infusion at 2000 units/hr (~18 units/kg/hr) Check anti-Xa level in 6 hours and daily while on heparin Continue to monitor H&H and platelets  Ruben Im, PharmD Clinical Pharmacist 06/30/2022 11:28 PM Please check AMION for all [facility] numbers

## 2022-06-30 NOTE — ED Triage Notes (Signed)
The pt is c/o shortness of breath for 2-3 days  no pain non-productive cough  no extemity swelling

## 2022-06-30 NOTE — Plan of Care (Signed)
Jerry Saunders XBJ:[id number] DOB: 04/12/1970 DOA: 06/30/2022    PCP: Pcp, No      Patient arrived to ER on 06/30/22 at 1733 Referred by Attending Linwood Dibbles, MD Cardiology [place]  Patient coming from:    home Lives a  With family    Chief Complaint:   Chief Complaint  Patient presents with   Shortness of Breath    HPI: Jerry Saunders is a 52 y.o. male with medical history significant of CHF  Dm2, DVT HLD, HTN  Presented with  SOB and fever Came in with worsening SOB Initially febrile to 101 No CP   Denies significant ETOH intake   Does not smoke   Lab Results  Component Value Date   SARSCOV2NAA NEGATIVE 06/30/2022        Regarding pertinent Chronic problems:       HTN on Cozaar toprol, entresto   chronic CHF systolic -  On entresto, spironolactone, torsemide  last echo  Recent Results (from the past 30865 hour(s))  ECHOCARDIOGRAM COMPLETE   Collection Time: 06/19/21  2:46 PM  Result Value   Weight 4,320   Height 74   BP 114/87   S' Lateral 6.20   AR max vel 3.49   AV Area VTI 2.57   AV Mean grad 4.0   AV Peak grad 5.4   Ao pk vel 1.16   Area-P 1/2 7.90   MV M vel 3.82   AV Area mean vel 3.06   MV Peak grad 58.4   Narrative      ECHOCARDIOGRAM REPORT         1. Left ventricular ejection fraction, by estimation, is <20%. The left ventricle has severely decreased function. The left ventricle demonstrates global hypokinesis. The left ventricular internal cavity size was moderately to severely dilated. There is  mild left ventricular hypertrophy. Left ventricular diastolic parameters were normal.  2. Right ventricular systolic function is mildly reduced. The right ventricular size is not well visualized. There is normal pulmonary artery systolic pressure.  3. Left atrial size was mild to moderately dilated.  4. The mitral valve is normal in structure. Mild mitral valve regurgitation.  5. The aortic valve is tricuspid. Aortic valve regurgitation is not  visualized.  6. Aortic no significant ascending aneurysm.  7. The inferior vena cava is dilated in size with >50% respiratory variability, suggesting right atrial pressure of 8 mmHg.              DM 2 -  Lab Results  Component Value Date   HGBA1C 7.0 (H) 01/19/2022    diet controlled     obesity-   BMI Readings from Last 1 Encounters:  06/30/22 37.76 kg/m      Chronic anemia - baseline hg Hemoglobin & Hematocrit  Recent Labs    06/30/22 1810  HGB 11.5*       While in ER: Clinical Course as of 07/01/22 0023  Sat Jun 30, 2022  2018 Chest x-ray shows pulmonary vascular congestion [JK]  2018 Troponin I (High Sensitivity)(!) Troponin elevated at 72.  Lactic acid level normal at 1.8 [JK]  2019 Sodium slightly decreased compared to previous [JK]  2019 Trop increased since last [JK]  2222 Brain natriuretic peptide(!) BNP is elevated, increased compared to previous [JK]  2222 SARS Coronavirus 2 by RT PCR (hospital order, performed in [facility] hospital lab) *cepheid single result test* Anterior Nasal Swab Covid flu negative [JK]  2230 Acute pe noted on CT [JK]  2300 Case discussed with Dr Adela Glimpse [JK]  2327 Notified the patient's blood pressure has decreased he is feeling more short of breath now.  Will give him a small fluid bolus but I will also order pressors.  With his worsening condition I will consult with the intensivist. [JK]  2333 Case discussed with Dr Marchelle Gearing.  Critical will come admit, see him.  REquests cardiology consult as well.  Pt's EF is very low 20%.  May end up needing thrombolysis [JK]  Sun Jul 01, 2022  0007 Case discussed with Dr Odis Hollingshead, as well.  Pt [facility] [JK]    Clinical Course User Index [JK] Linwood Dibbles, MD     Lab Orders         Blood culture (routine x 2)         SARS Coronavirus 2 by RT PCR (hospital order, performed in [facility] hospital lab) *cepheid single result test* Anterior Nasal Swab         Basic  metabolic panel         CBC         Brain natriuretic peptide         Urinalysis, w/ Reflex to Culture (Infection Suspected) -Urine, Clean Catch         Magnesium         Heparin level (unfractionated)         Heparin level (unfractionated)         CBC        CXR - Cardiomegaly with pulmonary vascular congestion and mild pulmonary edema.    CTA chest -   evidence of infiltrate  Following Medications were ordered in ER: Medications  azithromycin (ZITHROMAX) 500 mg in sodium chloride 0.9 % 250 mL IVPB (500 mg Intravenous New Bag/Given 06/30/22 2233)  furosemide (LASIX) injection 40 mg (has no administration in time range)  potassium chloride SA (KLOR-CON M) CR tablet 40 mEq (has no administration in time range)  morphine (PF) 4 MG/ML injection 4 mg (has no administration in time range)  heparin bolus via infusion 4,000 Units (has no administration in time range)  heparin ADULT infusion 100 units/mL (25000 units/242mL) (has no administration in time range)  0.9 %  sodium chloride infusion (has no administration in time range)  norepinephrine (LEVOPHED) 4mg  in (0.016 mg/mL) premix infusion (has no administration in time range)  acetaminophen (TYLENOL) tablet 650 mg (650 mg Oral Given 06/30/22 1815)  sodium chloride 0.9 % bolus 500 mL (0 mLs Intravenous Stopped 06/30/22 2212)  cefTRIAXone (ROCEPHIN) 1 g in sodium chloride 0.9 % 100 mL IVPB (0 g Intravenous Stopped 06/30/22 2127)  iohexol (OMNIPAQUE) 350 MG/ML injection 75 mL (75 mLs Intravenous Contrast Given 06/30/22 2214)    _______________________________________________________ ER Provider Called:  PCCM  Dr. Marchelle Gearing  ER Provider Called:  Cardiology  Dr.Tolia   ED Triage Vitals  Enc Vitals Group     BP 06/30/22 1755 105/73     Pulse Rate 06/30/22 1755 (!) 116     Resp 06/30/22 1755 (!) 26     Temp 06/30/22 1755 (!) 102.1 F (38.9 C)     Temp Source 06/30/22 2212 Oral     SpO2 06/30/22 1755 96 %     Weight 06/30/22 1806  294 lb 1.5 oz (133.4 kg)     Height 06/30/22 1806 6\' 2"  (1.88 m)     Head Circumference --      Peak Flow --  Pain Score 06/30/22 1805 8     Pain Loc --      Pain Edu? --      Excl. in GC? --   TMAX(24)@     _________________________________________ Significant initial  Findings: Abnormal Labs Reviewed  BASIC METABOLIC PANEL - Abnormal; Notable for the following components:      Result Value   Sodium 133 (*)    Glucose, Bld 139 (*)    BUN 31 (*)    Calcium 8.0 (*)    All other components within normal limits  CBC - Abnormal; Notable for the following components:   RBC 4.20 (*)    Hemoglobin 11.5 (*)    HCT 35.9 (*)    All other components within normal limits  BRAIN NATRIURETIC PEPTIDE - Abnormal; Notable for the following components:   B Natriuretic Peptide 2,206.2 (*)    All other components within normal limits  URINALYSIS, W/ REFLEX TO CULTURE (INFECTION SUSPECTED) - Abnormal; Notable for the following components:   Color, Urine AMBER (*)    APPearance HAZY (*)    Hgb urine dipstick SMALL (*)    Protein, ur 30 (*)    Bacteria, UA FEW (*)    All other components within normal limits  TROPONIN I (HIGH SENSITIVITY) - Abnormal; Notable for the following components:   Troponin I (High Sensitivity) 72 (*)    All other components within normal limits  TROPONIN I (HIGH SENSITIVITY) - Abnormal; Notable for the following components:   Troponin I (High Sensitivity) 72 (*)    All other components within normal limits    _________________________ Troponin   Cardiac Panel (last 3 results) Recent Labs    06/30/22 1810 06/30/22 2010  TROPONINIHS 72* 72*      ECG: Ordered Personally reviewed and interpreted by me showing: HR : 96 Rhythm:   Sinus rhythm Ventricular bigeminy , new since last tracing Biatrial enlargement Right ventricular hypertrophy Nonspecific T abnrm, anterolateral leads  QTC 371  BNP (last 3 results) Recent Labs    06/30/22 1810  BNP 2,206.2*      COVID-19 Labs  No results for input(s): "DDIMER", "FERRITIN", "LDH", "CRP" in the last 72 hours.  Lab Results  Component Value Date   SARSCOV2NAA NEGATIVE 06/30/2022    ____________________ This patient meets SIRS Criteria and may be septic.    The recent clinical data is shown below. Vitals:   06/30/22 2130 06/30/22 2145 06/30/22 2212 06/30/22 2230  BP: 110/78 103/80  104/72  Pulse: (!) 101 (!) 102    Resp: (!) 28 (!) 28  (!) 21  Temp:   99.1 F (37.3 C)   TempSrc:   Oral   SpO2: 98% 100%  95%  Weight:      Height:        WBC     Component Value Date/Time   WBC 9.4 06/30/2022 1810   LYMPHSABS 2.6 06/18/2021 1735   MONOABS 0.6 06/18/2021 1735   EOSABS 0.1 06/18/2021 1735   BASOSABS 0.0 06/18/2021 1735     Lactic Acid, Venous    Component Value Date/Time   LATICACIDVEN 1.8 06/30/2022 1811     UA   no evidence of UTI      Urine analysis:    Component Value Date/Time   COLORURINE AMBER (A) 06/30/2022 2230   APPEARANCEUR HAZY (A) 06/30/2022 2230   LABSPEC 1.024 06/30/2022 2230   PHURINE 5.0 06/30/2022 2230   GLUCOSEU NEGATIVE 06/30/2022 2230   HGBUR SMALL (A) 06/30/2022  2230   BILIRUBINUR NEGATIVE 06/30/2022 2230   KETONESUR NEGATIVE 06/30/2022 2230   PROTEINUR 30 (A) 06/30/2022 2230   NITRITE NEGATIVE 06/30/2022 2230   LEUKOCYTESUR NEGATIVE 06/30/2022 2230    Results for orders placed or performed during the hospital encounter of 06/30/22  SARS Coronavirus 2 by RT PCR (hospital order, performed in [facility] hospital lab) *cepheid single result test* Anterior Nasal Swab     Status: None   Collection Time: 06/30/22  9:01 PM   Specimen: Anterior Nasal Swab  Result Value Ref Range Status   SARS Coronavirus 2 by RT PCR NEGATIVE NEGATIVE Final    Comment: Performed at [facility], [address] [place], [place] [address]    ABX started Antibiotics Given (last 72 hours)     Date/Time Action Medication Dose Rate   06/30/22 2057 New  Bag/Given   cefTRIAXone (ROCEPHIN) 1 g in sodium chloride 0.9 % 100 mL IVPB 1 g 200 mL/hr   06/30/22 2233 New Bag/Given   azithromycin (ZITHROMAX) 500 mg in sodium chloride 0.9 % 250 mL IVPB 500 mg 250 mL/hr       No results found for the last 90 days.    __________________________________________________________ Recent Labs  Lab 06/30/22 1810  NA 133*  K 3.6  CO2 24  GLUCOSE 139*  BUN 31*  CREATININE 1.21  CALCIUM 8.0*    Cr   stable,    Lab Results  Component Value Date   CREATININE 1.21 06/30/2022   CREATININE 0.92 01/19/2022   CREATININE 0.89 06/23/2021    No results for input(s): "AST", "ALT", "ALKPHOS", "BILITOT", "PROT", "ALBUMIN" in the last 168 hours. Lab Results  Component Value Date   CALCIUM 8.0 (L) 06/30/2022       Plt: Lab Results  Component Value Date   PLT 239 06/30/2022    Recent Labs  Lab 06/30/22 1810  WBC 9.4  HGB 11.5*  HCT 35.9*  MCV 85.5  PLT 239    HG/HCT   stable,      Component Value Date/Time   HGB 11.5 (L) 06/30/2022 1810   HCT 35.9 (L) 06/30/2022 1810   MCV 85.5 06/30/2022 1810      _______________________________________________ Hospitalist was called for admission for   Acute pulmonary embolism without acute cor pulmonale, unspecified pulmonary embolism type (HCC)    Congestive heart failure, unspecified HF chronicity, unspecified heart failure type [facility]) Patient rapidly decompensated requiring PCCM consult Now likely in cardiogenic versus obstructive shock. Although initially clot burden considered to be low given severe CHF likely hemodynamically sufficient to cause shock Appreciate PCCM and cardiology consult    The following Work up has been ordered so far:  Orders Placed This Encounter  Procedures   Critical Care   Blood culture (routine x 2)   SARS Coronavirus 2 by RT PCR (hospital order, performed in [facility] hospital lab) *cepheid single result test* Anterior Nasal Swab   DG Chest 2 View   CT  Angio Chest PE W and/or Wo Contrast   Basic metabolic panel   CBC   Brain natriuretic peptide   Urinalysis, w/ Reflex to Culture (Infection Suspected) -Urine, Clean Catch   Magnesium   Heparin level (unfractionated)   Heparin level (unfractionated)   CBC   Document Height and Actual Weight   Vasopressor infusion via peripheral IV access   Assess IV site every 2 hours for vasopressor extravasation   IV site should be re-confirmed by blood return every 24 hours of  vasopressor administration   Peripheral administration of vasopressors should not exceed 72 hours   Use ivWatch to detect peripheral IV (PIV) infiltrations and extravasations   Use ultrasound guided IV for peripheral infusion (may start medication in non-US placed line)   heparin per pharmacy consult   Consult to hospitalist   Consult to intensivist   pharmacy consult   Airborne and Contact precautions   Bipap   EKG 12-Lead   ED EKG   EKG 12-Lead   EKG 12-Lead     OTHER Significant initial  Findings:  labs showing:     DM  labs:  HbA1C: Recent Labs    01/19/22 1554  HGBA1C 7.0*       CBG (last 3)  No results for input(s): "GLUCAP" in the last 72 hours.        Cultures: No results found for: "SDES", "SPECREQUEST", "CULT", "REPTSTATUS"   Radiological Exams on Admission: CT Angio Chest PE W and/or Wo Contrast  Result Date: 06/30/2022 CLINICAL DATA:  Cough, short of breath, febrile, suspected pulmonary embolus EXAM: CT ANGIOGRAPHY CHEST WITH CONTRAST TECHNIQUE: Multidetector CT imaging of the chest was performed using the standard protocol during bolus administration of intravenous contrast. Multiplanar CT image reconstructions and MIPs were obtained to evaluate the vascular anatomy. RADIATION DOSE REDUCTION: This exam was performed according to the departmental dose-optimization program which includes automated exposure control, adjustment of the mA and/or kV according to patient size and/or use of iterative  reconstruction technique. CONTRAST:  75mL OMNIPAQUE IOHEXOL 350 MG/ML SOLN COMPARISON:  06/30/2022, 06/18/2021 FINDINGS: Cardiovascular: This is a technically adequate evaluation of the pulmonary vasculature. There is a proximal segmental pulmonary embolus within the right lower lobe. Minimal clot burden. No evidence of right heart strain, with chronic dilatation of the cardiac chambers again noted. Continued cardiomegaly without pericardial effusion. Normal caliber of the thoracic aorta. Mediastinum/Nodes: No enlarged mediastinal, hilar, or axillary lymph nodes. Thyroid gland, trachea, and esophagus demonstrate no significant findings. Lungs/Pleura: No acute airspace disease, effusion, or pneumothorax. Scattered hypoventilatory changes are seen at the lung bases. Sub solid 9 mm subpleural right lower lobe pulmonary nodule reference image 100/7 is noted, new since prior exam. Upper Abdomen: No acute abnormality. Musculoskeletal: No acute or destructive bony abnormalities. Reconstructed images demonstrate no additional findings. Review of the MIP images confirms the above findings. IMPRESSION: 1. Right lower lobe proximal segmental pulmonary embolus. Minimal clot burden with no evidence of right heart strain. 2. Stable cardiomegaly, with marked dilatation of the cardiac chambers again noted. No pericardial effusion. 3. Right part-solid pulmonary nodule measuring 9 mm. Per Fleischner Society Guidelines, recommend a non-contrast Chest CT at 3-6 months to confirm persistence. If unchanged and the solid component remains < 6 mm, an annual non-contrast Chest CT should be performed for 5 years. If the solid component is 6-8 mm on follow-up, recommend biopsy or resection. If the solid component is > 8 mm on follow-up, recommend PET/CT, biopsy or resection. These guidelines do not apply to immunocompromised patients and patients with cancer. Follow up in patients with significant comorbidities as clinically warranted. For  lung cancer screening, adhere to Lung-RADS guidelines. Reference: Radiology. 2017; 284(1):228-43. Critical Value/emergent results were called by telephone at the time of interpretation on 06/30/2022 at 10:30 pm to provider [facility] , who verbally acknowledged these results. Electronically Signed   By: Sharlet Salina M.D.   On: 06/30/2022 22:31   DG Chest 2 View  Result Date: 06/30/2022 CLINICAL DATA:  Elevated temperature, cough and  shortness of breath. EXAM: CHEST - 2 VIEW COMPARISON:  Chest radiograph dated June 18, 2021 FINDINGS: The heart is enlarged. Pulmonary vascular congestion and bilateral perihilar interstitial opacities suggesting mild pulmonary edema. No appreciable pleural effusion. IMPRESSION: Cardiomegaly with pulmonary vascular congestion and mild pulmonary edema. Electronically Signed   By: Larose Hires D.O.   On: 06/30/2022 18:50   _______________________________________________________________________________________________________ Latest  Blood pressure 104/72, pulse (!) 102, temperature 99.1 F (37.3 C), temperature source Oral, resp. rate (!) 21, height 6\' 2"  (1.88 m), weight 133.4 kg, SpO2 95 %.   Vitals  labs and radiology finding personally reviewed  Review of Systems:    Pertinent positives include:   Fevers, chills, fatigue,hortness of breath at rest. No dyspnea on exertion, Constitutional:  No weight loss, night sweats,  weight loss  HEENT:  No headaches, Difficulty swallowing,Tooth/dental problems,Sore throat,  No sneezing, itching, ear ache, nasal congestion, post nasal drip,  Cardio-vascular:  No chest pain, Orthopnea, PND, anasarca, dizziness, palpitations.no Bilateral lower extremity swelling  GI:  No heartburn, indigestion, abdominal pain, nausea, vomiting, diarrhea, change in bowel habits, loss of appetite, melena, blood in stool, hematemesis Resp:  no s No excess mucus, no productive cough, No non-productive cough, No coughing up of blood.No change in  color of mucus.No wheezing. Skin:  no rash or lesions. No jaundice GU:  no dysuria, change in color of urine, no urgency or frequency. No straining to urinate.  No flank pain.  Musculoskeletal:  No joint pain or no joint swelling. No decreased range of motion. No back pain.  Psych:  No change in mood or affect. No depression or anxiety. No memory loss.  Neuro: no localizing neurological complaints, no tingling, no weakness, no double vision, no gait abnormality, no slurred speech, no confusion  All systems reviewed and apart from HOPI all are negative _______________________________________________________________________________________________ Past Medical History:   Past Medical History:  Diagnosis Date   Cardiomyopathy (HCC)    CHF (congestive heart failure) (HCC)    Diabetes mellitus without complication (HCC)    DVT (deep venous thrombosis) (HCC)    Hyperlipidemia    Hypertension      Past Surgical History:  Procedure Laterality Date   RIGHT/LEFT HEART CATH AND CORONARY ANGIOGRAPHY N/A 06/20/2021   Procedure: RIGHT/LEFT HEART CATH AND CORONARY ANGIOGRAPHY;  Surgeon: Elder Negus, MD;  Location: [facility] INVASIVE [facility] LAB;  Service: Cardiovascular;  Laterality: N/A;    Social History:  Ambulatory   independently       reports that he has never smoked. He does not have any smokeless tobacco history on file. He reports that he does not drink alcohol and does not use drugs.     Family History:   History reviewed. No pertinent family history. ______________________________________________________________________________________________ Allergies: No Known Allergies   Prior to Admission medications   Medication Sig Start Date End Date Taking? Authorizing Provider  apixaban (ELIQUIS) 5 MG TABS tablet Take 1 tablet (5 mg total) by mouth 2 (two) times daily. 11/14/21  Yes Tolia, Sunit, DO  Artificial Tear Ointment (DRY EYES OP) Place 2 drops into both eyes 2 (two) times  daily as needed (dry eyes).    [provider]  losartan (COZAAR) 25 MG tablet Take 1 tablet (25 mg total) by mouth daily at 10 pm. 01/18/22 02/17/22  Tolia, Sunit, DO  metoprolol succinate (TOPROL XL) 50 MG 24 hr tablet Take 1 tablet (50 mg total) by mouth every morning. Hold if systolic blood pressure (top number) less than  100 mmHg or pulse less than 60 bpm. 01/18/22 04/18/22  Tolia, Sunit, DO  sacubitril-valsartan (ENTRESTO) 49-51 MG Take 1 tablet by mouth 2 (two) times daily. 01/30/22   Tolia, Sunit, DO  spironolactone (ALDACTONE) 25 MG tablet Take 1 tablet (25 mg total) by mouth in the morning. 01/18/22 04/18/22  Tolia, Sunit, DO  torsemide (DEMADEX) 10 MG tablet Take 1 tablet (10 mg total) by mouth every morning. 01/18/22 02/17/22  Tessa Lerner, DO    ___________________________________________________________________________________________________ Physical Exam:    06/30/2022   10:30 PM 06/30/2022    9:45 PM 06/30/2022    9:30 PM  Vitals with BMI  Systolic 104 103 829  Diastolic 72 80 78  Pulse  102 562     1. General:  in Acute distress increased work of breathing  complaining of severe pain agitated  toxic acutely ill -appearing 2. Psychological: Alert and   Oriented 3. Head/ENT:   Moist  Mucous Membranes                          Head Non traumatic, neck supple                          Normal   Dentition 4. SKIN: decreased Skin turgor,  Skin clean Dry and intact no rash    5. Heart: Regular rate and rhythm no  Murmur, no Rub or gallop 6. Lungs:  , no wheezes some crackles   7. Abdomen: Soft,  non-tender, Non distended   obese  bowel sounds present 8. Lower extremities: no clubbing, cyanosis, trace edema 9. Neurologically Grossly intact, moving all 4 extremities equally   10. MSK: Normal range of motion    Chart has been reviewed  ______________________________________________________________________________________________  Assessment/Plan  52 y.o. male with  medical history significant of CHF  Dm2, DVT HLD, HTN Here with  Acute pulmonary embolism   Congestive heart failure, unspecified HF chronicity, unspecified heart failure type (HCC) And possibly cardiogenic shock  Patient unstable for [facility] admission at this time Appreciate PCCM and cardiology consult Patient in cardiogenic/obstructive shock requiring pressors And possible tPA Hospitalist for right now sign off      DVT prophylaxis:    heparin bolus via infusion 4,000 Units Start: 06/30/22 2330    Code Status:    Code Status: Prior FULL CODE as per patient     ACP none   Family Communication:   Family not at  Bedside    Diet npo        Consult Orders  (From admission, onward)           Start     Ordered   06/30/22 2250  Consult to hospitalist  Pg by Viviann Spare  Once       Provider:  (Not yet assigned)  Question Answer Comment  Place call to: [facility]   Reason for Consult Admit      06/30/22 2249            Naydelin Ziegler 07/01/2022, 12:55 AM    [facility]     after 2 AM please page floor coverage PA If 7AM-7PM, please contact the day team taking care of the patient using [email or web address]

## 2022-06-30 NOTE — Subjective & Objective (Signed)
Came in with worsening SOB Initially febrile to 101 No CP

## 2022-06-30 NOTE — ED Provider Notes (Signed)
[place] EMERGENCY DEPARTMENT AT [facility] Provider Note   CSN: [id number] Arrival date & time: 06/30/22  1733     History  Chief Complaint  Patient presents with  . Shortness of Breath    Jerry Saunders is a 52 y.o. male.   Shortness of Breath    Patient has a history of congestive heart failure diabetes hypertension hyperlipidemia cardiomyopathy, DVT.  Patient states in the last couple of weeks he has been having trouble with cough and congestion.  He has been feeling fatigued and more short of breath.  He has felt feverish.  He also has noticed increasing leg swelling.  Today symptoms were worse so he came to the ED.  He has not seen anyone for this prior to today  Home Medications Prior to Admission medications   Medication Sig Start Date End Date Taking? Authorizing Provider  apixaban (ELIQUIS) 5 MG TABS tablet Take 1 tablet (5 mg total) by mouth 2 (two) times daily. 11/14/21  Yes Tolia, Sunit, DO  Artificial Tear Ointment (DRY EYES OP) Place 2 drops into both eyes 2 (two) times daily as needed (dry eyes).    [provider]  losartan (COZAAR) 25 MG tablet Take 1 tablet (25 mg total) by mouth daily at 10 pm. 01/18/22 02/17/22  Tolia, Sunit, DO  metoprolol succinate (TOPROL XL) 50 MG 24 hr tablet Take 1 tablet (50 mg total) by mouth every morning. Hold if systolic blood pressure (top number) less than 100 mmHg or pulse less than 60 bpm. 01/18/22 04/18/22  Tolia, Sunit, DO  sacubitril-valsartan (ENTRESTO) 49-51 MG Take 1 tablet by mouth 2 (two) times daily. 01/30/22   Tolia, Sunit, DO  spironolactone (ALDACTONE) 25 MG tablet Take 1 tablet (25 mg total) by mouth in the morning. 01/18/22 04/18/22  Tolia, Sunit, DO  torsemide (DEMADEX) 10 MG tablet Take 1 tablet (10 mg total) by mouth every morning. 01/18/22 02/17/22  Tessa Lerner, DO      Allergies    Patient has no known allergies.    Review of Systems   Review of Systems  Respiratory:  Positive for shortness of  breath.     Physical Exam Updated Vital Signs BP 104/72   Pulse (!) 102   Temp 99.1 F (37.3 C) (Oral)   Resp (!) 21   Ht 1.88 m (6\' 2" )   Wt 133.4 kg   SpO2 95%   BMI 37.76 kg/m  Physical Exam Vitals and nursing note reviewed.  Constitutional:      Appearance: He is well-developed. He is not diaphoretic.  HENT:     Head: Normocephalic and atraumatic.     Right Ear: External ear normal.     Left Ear: External ear normal.  Eyes:     General: No scleral icterus.       Right eye: No discharge.        Left eye: No discharge.     Conjunctiva/sclera: Conjunctivae normal.  Neck:     Trachea: No tracheal deviation.  Cardiovascular:     Rate and Rhythm: Normal rate and regular rhythm.  Pulmonary:     Effort: Pulmonary effort is normal. Tachypnea present. No respiratory distress.     Breath sounds: No stridor. Rhonchi present. No wheezing or rales.  Abdominal:     General: Bowel sounds are normal. There is no distension.     Palpations: Abdomen is soft.     Tenderness: There is no abdominal tenderness. There is no guarding or  rebound.  Musculoskeletal:        General: No tenderness or deformity.     Cervical back: Neck supple.     Right lower leg: No tenderness. Edema present.     Left lower leg: No tenderness. Edema present.  Skin:    General: Skin is warm and dry.     Findings: No rash.  Neurological:     General: No focal deficit present.     Mental Status: He is alert.     Cranial Nerves: No cranial nerve deficit, dysarthria or facial asymmetry.     Sensory: No sensory deficit.     Motor: No abnormal muscle tone or seizure activity.     Coordination: Coordination normal.  Psychiatric:        Mood and Affect: Mood normal.     ED Results / Procedures / Treatments   Labs (all labs ordered are listed, but only abnormal results are displayed) Labs Reviewed  BASIC METABOLIC PANEL - Abnormal; Notable for the following components:      Result Value   Sodium 133 (*)     Glucose, Bld 139 (*)    BUN 31 (*)    Calcium 8.0 (*)    All other components within normal limits  CBC - Abnormal; Notable for the following components:   RBC 4.20 (*)    Hemoglobin 11.5 (*)    HCT 35.9 (*)    All other components within normal limits  BRAIN NATRIURETIC PEPTIDE - Abnormal; Notable for the following components:   B Natriuretic Peptide 2,206.2 (*)    All other components within normal limits  URINALYSIS, W/ REFLEX TO CULTURE (INFECTION SUSPECTED) - Abnormal; Notable for the following components:   Color, Urine AMBER (*)    APPearance HAZY (*)    Hgb urine dipstick SMALL (*)    Protein, ur 30 (*)    Bacteria, UA FEW (*)    All other components within normal limits  TROPONIN I (HIGH SENSITIVITY) - Abnormal; Notable for the following components:   Troponin I (High Sensitivity) 72 (*)    All other components within normal limits  TROPONIN I (HIGH SENSITIVITY) - Abnormal; Notable for the following components:   Troponin I (High Sensitivity) 72 (*)    All other components within normal limits  SARS CORONAVIRUS 2 BY RT PCR  CULTURE, BLOOD (ROUTINE X 2)  CULTURE, BLOOD (ROUTINE X 2)  LACTIC ACID, PLASMA  MAGNESIUM  HEPARIN LEVEL (UNFRACTIONATED)  CBC    EKG EKG Interpretation  Date/Time:  Saturday June 30 2022 22:29:53 EDT Ventricular Rate:  96 PR Interval:  165 QRS Duration: 100 QT Interval:  317 QTC Calculation: 371 R Axis:   149 Text Interpretation: Sinus rhythm Ventricular bigeminy , new since last tracing Biatrial enlargement Right ventricular hypertrophy Nonspecific T abnrm, anterolateral leads Since last tracing rate slower Confirmed by Linwood Dibbles [phone]) on 06/30/2022 10:47:32 PM  Radiology CT Angio Chest PE W and/or Wo Contrast  Result Date: 06/30/2022 CLINICAL DATA:  Cough, short of breath, febrile, suspected pulmonary embolus EXAM: CT ANGIOGRAPHY CHEST WITH CONTRAST TECHNIQUE: Multidetector CT imaging of the chest was performed using the standard  protocol during bolus administration of intravenous contrast. Multiplanar CT image reconstructions and MIPs were obtained to evaluate the vascular anatomy. RADIATION DOSE REDUCTION: This exam was performed according to the departmental dose-optimization program which includes automated exposure control, adjustment of the mA and/or kV according to patient size and/or use of iterative reconstruction technique. CONTRAST:  75mL OMNIPAQUE  IOHEXOL 350 MG/ML SOLN COMPARISON:  06/30/2022, 06/18/2021 FINDINGS: Cardiovascular: This is a technically adequate evaluation of the pulmonary vasculature. There is a proximal segmental pulmonary embolus within the right lower lobe. Minimal clot burden. No evidence of right heart strain, with chronic dilatation of the cardiac chambers again noted. Continued cardiomegaly without pericardial effusion. Normal caliber of the thoracic aorta. Mediastinum/Nodes: No enlarged mediastinal, hilar, or axillary lymph nodes. Thyroid gland, trachea, and esophagus demonstrate no significant findings. Lungs/Pleura: No acute airspace disease, effusion, or pneumothorax. Scattered hypoventilatory changes are seen at the lung bases. Sub solid 9 mm subpleural right lower lobe pulmonary nodule reference image 100/7 is noted, new since prior exam. Upper Abdomen: No acute abnormality. Musculoskeletal: No acute or destructive bony abnormalities. Reconstructed images demonstrate no additional findings. Review of the MIP images confirms the above findings. IMPRESSION: 1. Right lower lobe proximal segmental pulmonary embolus. Minimal clot burden with no evidence of right heart strain. 2. Stable cardiomegaly, with marked dilatation of the cardiac chambers again noted. No pericardial effusion. 3. Right part-solid pulmonary nodule measuring 9 mm. Per Fleischner Society Guidelines, recommend a non-contrast Chest CT at 3-6 months to confirm persistence. If unchanged and the solid component remains < 6 mm, an annual  non-contrast Chest CT should be performed for 5 years. If the solid component is 6-8 mm on follow-up, recommend biopsy or resection. If the solid component is > 8 mm on follow-up, recommend PET/CT, biopsy or resection. These guidelines do not apply to immunocompromised patients and patients with cancer. Follow up in patients with significant comorbidities as clinically warranted. For lung cancer screening, adhere to Lung-RADS guidelines. Reference: Radiology. 2017; 284(1):228-43. Critical Value/emergent results were called by telephone at the time of interpretation on 06/30/2022 at 10:30 pm to provider [facility] , who verbally acknowledged these results. Electronically Signed   By: Sharlet Salina M.D.   On: 06/30/2022 22:31   DG Chest 2 View  Result Date: 06/30/2022 CLINICAL DATA:  Elevated temperature, cough and shortness of breath. EXAM: CHEST - 2 VIEW COMPARISON:  Chest radiograph dated June 18, 2021 FINDINGS: The heart is enlarged. Pulmonary vascular congestion and bilateral perihilar interstitial opacities suggesting mild pulmonary edema. No appreciable pleural effusion. IMPRESSION: Cardiomegaly with pulmonary vascular congestion and mild pulmonary edema. Electronically Signed   By: Larose Hires D.O.   On: 06/30/2022 18:50    Procedures .Critical Care  Performed by: Linwood Dibbles, MD Authorized by: Linwood Dibbles, MD   Critical care provider statement:    Critical care time (minutes):  45   Critical care was time spent personally by me on the following activities:  Development of treatment plan with patient or surrogate, discussions with consultants, evaluation of patient's response to treatment, examination of patient, ordering and review of laboratory studies, ordering and review of radiographic studies, ordering and performing treatments and interventions, pulse oximetry, re-evaluation of patient's condition and review of old charts     Medications Ordered in ED Medications  azithromycin  (ZITHROMAX) 500 mg in sodium chloride 0.9 % 250 mL IVPB (500 mg Intravenous New Bag/Given 06/30/22 2233)  furosemide (LASIX) injection 40 mg (has no administration in time range)  potassium chloride SA (KLOR-CON M) CR tablet 40 mEq (has no administration in time range)  morphine (PF) 4 MG/ML injection 4 mg (has no administration in time range)  heparin bolus via infusion 4,000 Units (has no administration in time range)  heparin ADULT infusion 100 units/mL (25000 units/272mL) (has no administration in time range)  0.9 %  sodium chloride infusion (has no administration in time range)  norepinephrine (LEVOPHED) 4mg  in (0.016 mg/mL) premix infusion (has no administration in time range)  acetaminophen (TYLENOL) tablet 650 mg (650 mg Oral Given 06/30/22 1815)  sodium chloride 0.9 % bolus 500 mL (0 mLs Intravenous Stopped 06/30/22 2212)  cefTRIAXone (ROCEPHIN) 1 g in sodium chloride 0.9 % 100 mL IVPB (0 g Intravenous Stopped 06/30/22 2127)  iohexol (OMNIPAQUE) 350 MG/ML injection 75 mL (75 mLs Intravenous Contrast Given 06/30/22 2214)    ED Course/ Medical Decision Making/ A&P Clinical Course as of 06/30/22 2342  Sat Jun 30, 2022  2018 Chest x-ray shows pulmonary vascular congestion [JK]  2018 Troponin I (High Sensitivity)(!) Troponin elevated at 72.  Lactic acid level normal at 1.8 [JK]  2019 Sodium slightly decreased compared to previous [JK]  2019 Trop increased since last [JK]  2222 Brain natriuretic peptide(!) BNP is elevated, increased compared to previous [JK]  2222 SARS Coronavirus 2 by RT PCR (hospital order, performed in [facility] hospital lab) *cepheid single result test* Anterior Nasal Swab Covid flu negative [JK]  2230 Acute pe noted on CT [JK]  2300 Case discussed with Dr Adela Glimpse [JK]  2327 Notified the patient's blood pressure has decreased he is feeling more short of breath now.  Will give him a small fluid bolus but I will also order pressors.  With his worsening condition I  will consult with the intensivist. [JK]  2333 Case discussed with Dr Marchelle Gearing.  Critical will come admit, see him.  REquests cardiology consult as well.  Pt's EF is very low 20%.  May end up needing thrombolysis [JK]    Clinical Course User Index [JK] Linwood Dibbles, MD                             Medical Decision Making Differential diagnosis includes but not limited to CHF pneumonia pulmonary edema, pulmonary embolism  Problems Addressed: Acute pulmonary embolism without acute cor pulmonale, unspecified pulmonary embolism type [facility]): acute illness or injury that poses a threat to life or bodily functions Congestive heart failure, unspecified HF chronicity, unspecified heart failure type [facility]): acute illness or injury that poses a threat to life or bodily functions  Amount and/or Complexity of Data Reviewed Labs: ordered. Decision-making details documented in ED Course. Radiology: ordered.  Risk OTC drugs. Prescription drug management. Decision regarding hospitalization.   Patient presents to the ED for evaluation of shortness of breath.  In the ED noted to have a fever.  Presentation concerning for the possibility of pneumonia.  Patient's COVID test is negative.  His initial lactic acid level was normal at 1.8.  Patient does not have a leukocytosis.  Chest x-ray was more suggestive of pulmonary edema.  However with the patient's tachycardia and fever proceeded with CT angiogram.  Patient also has evidence of PE.  He does have elevated BNP and troponin I think this is consistent with his CHF.  Patient's tachycardia has resolved.  Heart rates back down to 96.  I have started on IV heparin.  I have ordered Lasix.  Will consult the medical service for admission and further treatment        Final Clinical Impression(s) / ED Diagnoses Final diagnoses:  Acute pulmonary embolism without acute cor pulmonale, unspecified pulmonary embolism type (HCC)  Congestive heart failure, unspecified HF  chronicity, unspecified heart failure type (HCC)    Rx / DC Orders ED Discharge Orders  None         Linwood Dibbles, MD 06/30/22 [phone]

## 2022-06-30 NOTE — H&P (Signed)
NAME:  Jerry Saunders, MRN:  [id number], DOB:  Feb 23, 1970, LOS: 0 ADMISSION DATE:  06/30/2022, CONSULTATION DATE:  07/01/22 REFERRING MD:  Lynelle Doctor - EM, CHIEF COMPLAINT:  SOB   History of Present Illness:  52 yo M PMH NICM EF <15% followed by [facility] and locally by [place] [facility], Hx DVT noncompliant w DOAC, LV thrombus, DM2, Severe pHTN mPAP 52 in June 2023 WHO group II, who presented to ED 6/22 with SOB and fatigue x a few weeks, some associated leg swelling. Worse sx 6/22 prompting ED presentation. In ED CXR suggestive of pulm edema, with hx of dvt DOAC noncompliace tachycardia- was sent for CTA chest which revealed RLL proximal segmental PE -- read as minimal clot burden and no evidence of R heart strain.  Received lasix in the ED. Became more tachycardic and hypotensive in ED and was started on levophed, prompting PCCM consultation . Also hypoxemic needing [place] o2 (refused BiPAP). Per cards Dr ToliaJune 2023 MRI Cardiology with severe low flow state LVEDV 444 mL and 27% EF for both LV and RV. LV very dilated and thus RV/LV ratio on CT will be inaccurate.  Admitted to [facility] 4/14-4/18/24 with decomp HFrEF + DVT   N.b.  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   has a past surgical history that includes RIGHT/LEFT HEART CATH AND CORONARY ANGIOGRAPHY (N/A, 06/20/2021).   Significant Hospital Events: Including procedures, antibiotic start and stop dates in addition to other pertinent events   07/01/22 to ED w tachycardia, SOB. Found to have PE -- is small but possibly hemodynamically significant when  taking into account his severe underlying chronic dz -- severe pHTN, NICM EF <15%   . [facility]  Interim History / Subjective:    Objective   Blood pressure (!) 92/47, pulse (!) 112, temperature 99.1 F (37.3 C), temperature source Oral, resp. rate (!) 21, height 6\' 2"  (1.88 m), weight 133.4 kg, SpO2 98 %.        Intake/Output Summary (Last 24 hours) at 06/30/2022 2348 Last data filed at 06/30/2022 2212 Gross per 24 hour  Intake 500.74 ml  Output --  Net 500.74 ml   Filed Weights   06/30/22 1806  Weight: 133.4 kg    Examination: General: obese. Sitting in bed. Dropping head off and looking exhausted HENT: O2 on. No neck nodes Lungs: On 02. No crackles. No wheezing.  Cardiovascular: Tachycardic Abdomen: Soft Non tender Extremities: Mild edema. Cool peripheries Neuro: AxOX3. Moves all 4s GU: not examined  CONTRaINDICATIONS to lytics - NONE BELOW   Thrrombolytic COntraindications (from Uptodate)  Absolute contraindications Prior intracranial hemorrhage Known structural cerebral vascular lesion Known malignant intracranial neoplasm Ischemic stroke within 3 months (excluding stroke within 3 hours*) Suspected aortic dissection Active bleeding or bleeding diathesis (excluding menses) Significant closed-head trauma or facial trauma within 3 months  Relative contraindications History of chronic, severe, poorly controlled hypertension Severe uncontrolled hypertension on presentation (SBP >180 mmHg or DBP >110 mmHg) History of ischemic stroke >3 months prior Traumatic or prolonged (>10 minutes) CPR or major surgery <3  weeks Recent (within 2 to 4 weeks) internal bleeding Noncompressible vascular punctures Recent invasive procedure For streptokinase/anistreplase - Prior exposure (>5 days ago) or prior allergic reaction to these agents Pregnancy Active peptic ulcer Pericarditis or pericardial fluid Current use of anticoagulant (eg, warfarin sodium) that has  produced an elevated INR >1.7 or PT >15 seconds Age >75 years Diabetic retinopathy   Assessment & Plan:  #Prior to & Present on Admit NICM (POA) Severe pHTN (group II) (POA) LV thrombus (POA)  Hx DVT, DOAC noncompliance (POA)   #Present on Admit - Acute Acute pulmonary embolism (POA)  - low anatomic clot burden but significant physiologic burden   - PESI Class 5, 140 points (male, circulatory shock, Tachycardia >= 110 and hypoxemia) Acute hypoxemic respiratory failure Shock, cardiogenic (POA) Decompensated combined systolic and diastolic HF (POA)   -difficult situation. PE is segments, read as low clot burden and read as no R heart strain but in context of his severely dilated LV at baseline this is difficult to glean per cardiology Dr Odis Hollingshead  - Dr Odis Hollingshead agrees (baed on chart review) that  patient is in circulatory shock -> ot is felt that despite this being low clot burden it is highly likely contributing to shock in the setting of severe NICM and low flow state  P -Rx LYTICS - no contraindications (see above)) - took shared decision making conversation with patient (witness RN Toma Copier) and discussion with cardiology Dr Odis Hollingshead - wife updated over phone -continue levophed -cards consult -AM adv HF consult  -BLE doppler  -? AICD referral this admit vs in future pending course  #Acute hypoxemic respiratory failure - Present on Admit; needing o2   Plan  - o2 for pulse ox goal > 94% - check RVP  #Circulatory Shock; cardiogenice + PE - Present on Admit  Plan  - levophed via PIV - MAP goal > 60 with SBP > 95  Hyponatremia, mild (POA)  Borderline hypokalemia (POA)  P -replace K, goal >4 -AM BMP   Anemia, mild (POA)   PLAN -AM CBC  - monitor for bleeding - - PRBC for hgb </= 6.9gm%    - exceptions are   -  if ACS susepcted/confirmed then transfuse for hgb </= 8.0gm%,  or    -  active bleeding with hemodynamic instability, then transfuse regardless of hemoglobin  value   At at all times try to transfuse 1 unit prbc as possible with exception of active hemorrhage   Best Practice (right click and "Reselect all SmartList Selections" daily)   Diet/type: NPO w/ oral meds DVT prophylaxis: other GI prophylaxis: PPI Lines: N/A - AVOID due to TPA Foley:  N/A Code Status:  full code Last date of multidisciplinary goals of care discussion [07/01/22 - wife and patient updated      ATTESTATION & SIGNATURE   The patient Aden Sek is critically ill with multiple organ systems failure and requires high complexity decision making for assessment and support, frequent evaluation and titration of therapies, application of advanced monitoring technologies and extensive interpretation of multiple databases and discussion with other appropriate health care personnel such as bedside nurses, social workers, case Production designer, theatre/television/film, consultants, respiratory therapists, nutritionists, secretaries etc.,  Critical care time includes but is not restricted to just documentation time. Documentation can happen in parallel or sequential to care time depending on case mix urgency and priorities for the shift. So, overall critical Care Time devoted to patient care services described in this note is  60  Minutes.  This time reflects time of care of this signee Dr Kalman Shan which includ does not reflect procedure time, or teaching time or supervisory time of PA/NP/Med student/Med Resident etc but could involve care discussion time     Dr. Kalman Shan, M.D., [facility].C.P Pulmonary and Critical Care Medicine Staff Physician, [facility] [place] [facility] Pager: [phone], If no answer or between  15:00h - 7:00h: call [phone]  07/01/2022 1:13 AM    LABS    PULMONARY No results for input(s): "PHART", "PCO2ART", "PO2ART", "HCO3", "TCO2", "O2SAT" in the last 168 hours.  Invalid input(s): "PCO2", "PO2"  CBC Recent Labs  Lab 06/30/22 1810   HGB 11.5*  HCT 35.9*  WBC 9.4  PLT 239    COAGULATION No results for input(s): "INR" in the last 168 hours.  CARDIAC  No results for input(s): "TROPONINI" in the last 168 hours. No results for input(s): "PROBNP" in the last 168 hours.   CHEMISTRY Recent Labs  Lab 06/30/22 1810 06/30/22 2010  NA 133*  --   K 3.6  --   CL 98  --   CO2 24  --   GLUCOSE 139*  --   BUN 31*  --   CREATININE 1.21  --   CALCIUM 8.0*  --   MG  --  2.4   Estimated Creatinine Clearance: 106.1 mL/min (by C-G formula based on SCr of 1.21 mg/dL).   LIVER No results for input(s): "AST", "ALT", "ALKPHOS", "BILITOT", "PROT", "ALBUMIN", "INR" in the last 168 hours.   INFECTIOUS Recent Labs  Lab 06/30/22 1811  LATICACIDVEN 1.8     ENDOCRINE CBG (last 3)  No results for input(s): "GLUCAP" in the last 72 hours.       IMAGING x48h  - image(s) personally visualized  -   highlighted in bold CT Angio Chest PE W and/or Wo Contrast  Result Date: 06/30/2022 CLINICAL DATA:  Cough, short of breath, febrile, suspected pulmonary embolus EXAM: CT ANGIOGRAPHY CHEST WITH CONTRAST TECHNIQUE: Multidetector CT imaging of the chest was performed using the standard protocol during bolus administration of intravenous contrast. Multiplanar CT image reconstructions and MIPs were obtained to evaluate the vascular anatomy. RADIATION DOSE REDUCTION: This exam was performed according to the departmental dose-optimization program which includes automated exposure control, adjustment of the mA and/or kV according to patient size and/or use of iterative reconstruction technique. CONTRAST:  75mL OMNIPAQUE IOHEXOL 350 MG/ML SOLN COMPARISON:  06/30/2022, 06/18/2021 FINDINGS: Cardiovascular: This is a technically adequate evaluation of the pulmonary vasculature. There is a proximal segmental pulmonary embolus within the right lower lobe. Minimal clot burden. No evidence of right heart strain, with chronic dilatation of the  cardiac chambers again noted. Continued cardiomegaly without pericardial effusion. Normal caliber of the thoracic aorta. Mediastinum/Nodes: No enlarged mediastinal, hilar, or axillary lymph nodes. Thyroid gland, trachea, and esophagus demonstrate no significant findings. Lungs/Pleura: No acute airspace disease, effusion, or pneumothorax. Scattered hypoventilatory changes are seen at the lung bases. Sub solid 9 mm subpleural right lower lobe pulmonary nodule reference image 100/7 is noted, new since prior exam. Upper Abdomen: No acute abnormality. Musculoskeletal: No acute or destructive bony abnormalities. Reconstructed images demonstrate no additional findings. Review of the MIP images confirms the above findings. IMPRESSION: 1. Right lower lobe proximal segmental pulmonary embolus. Minimal clot burden with no evidence of right heart strain. 2. Stable cardiomegaly, with marked dilatation of the cardiac chambers again noted. No pericardial effusion. 3. Right part-solid pulmonary  nodule measuring 9 mm. Per Fleischner Society Guidelines, recommend a non-contrast Chest CT at 3-6 months to confirm persistence. If unchanged and the solid component remains < 6 mm, an annual non-contrast Chest CT should be performed for 5 years. If the solid component is 6-8 mm on follow-up, recommend biopsy or resection. If the solid component is > 8 mm on follow-up, recommend PET/CT, biopsy or resection. These guidelines do not apply to immunocompromised patients and patients with cancer. Follow up in patients with significant comorbidities as clinically warranted. For lung cancer screening, adhere to Lung-RADS guidelines. Reference: Radiology. 2017; 284(1):228-43. Critical Value/emergent results were called by telephone at the time of interpretation on 06/30/2022 at 10:30 pm to provider [facility] , who verbally acknowledged these results. Electronically Signed   By: Sharlet Salina M.D.   On: 06/30/2022 22:31   DG Chest 2 View  Result  Date: 06/30/2022 CLINICAL DATA:  Elevated temperature, cough and shortness of breath. EXAM: CHEST - 2 VIEW COMPARISON:  Chest radiograph dated June 18, 2021 FINDINGS: The heart is enlarged. Pulmonary vascular congestion and bilateral perihilar interstitial opacities suggesting mild pulmonary edema. No appreciable pleural effusion. IMPRESSION: Cardiomegaly with pulmonary vascular congestion and mild pulmonary edema. Electronically Signed   By: Larose Hires D.O.   On: 06/30/2022 18:50

## 2022-06-30 NOTE — Consult Note (Incomplete)
[name] UJW:[id number] DOB: 1970-10-21 DOA: 06/30/2022     PCP: Oneita Hurt, No   Outpatient Specialists: * NONE CARDS: * Dr. None  NEphrology: *  Dr. NEurology *   Dr. Pulmonary *  Dr.  Oncology * Dr. Sandria Manly* Dr.  Deboraha Sprang, [facility]) No care team member to display Urology Dr. *  Patient arrived to ER on 06/30/22 at 1733 Referred by Attending Linwood Dibbles, MD   Patient coming from:    home Lives alone,   *** With family From facility ***  Chief Complaint:   Chief Complaint  Patient presents with  . Shortness of Breath    HPI: Jerry Saunders is a 52 y.o. male with medical history significant of CHF    Presented with  SOB and fever Came in with worsening SOB Initially febrile to 101 No CP   Denies significant ETOH intake   Does not smoke   Lab Results  Component Value Date   SARSCOV2NAA NEGATIVE 06/30/2022        Regarding pertinent Chronic problems: ***  ****Hyperlipidemia - *on statins {statin:315258}  Lipid Panel     Component Value Date/Time   CHOL 116 01/19/2022 1554   TRIG 64 01/19/2022 1554   HDL 44 01/19/2022 1554   CHOLHDL 3.5 06/21/2021 0209   VLDL 8 06/21/2021 0209   LDLCALC 58 01/19/2022 1554   LDLDIRECT 60 01/19/2022 1554   LDLDIRECT 51.4 06/21/2021 0209   LABVLDL 14 01/19/2022 1554    ***HTN on   ***chronic CHF diastolic/systolic/ combined - last echo*** Recent Results (from the past 56213 hour(s))  ECHOCARDIOGRAM COMPLETE   Collection Time: 06/19/21  2:46 PM  Result Value   Weight 4,320   Height 74   BP 114/87   S' Lateral 6.20   AR max vel 3.49   AV Area VTI 2.57   AV Mean grad 4.0   AV Peak grad 5.4   Ao pk vel 1.16   Area-P 1/2 7.90   MV M vel 3.82   AV Area mean vel 3.06   MV Peak grad 58.4   Narrative      ECHOCARDIOGRAM REPORT       Patient Name:   Jerry Saunders Date of Exam: 06/19/2021 Medical Rec #:  [id number]     Height:       74.0 in Accession #:    [id number]    Weight:       270.0 lb Date of Birth:  09/11/1970      BSA:          2.469 m Patient Age:    52 years      BP:           104/72 mmHg Patient Gender: M             HR:           107 bpm. Exam Location:  Inpatient  Procedure: 2D Echo, Cardiac Doppler, Color Doppler and Intracardiac            Opacification Agent  Indications:    Congestive heart failure   History:        Patient has no prior history of Echocardiogram examinations.                 Abnormal ECG, Signs/Symptoms:Shortness of Breath and Cough; Risk                 Factors:Diabetes.   Sonographer:    Rodrigo Ran RCS Referring Phys: [phone]  VASUNDHRA RATHORE    Sonographer Comments: Image acquisition challenging due to patient body habitus. IMPRESSIONS    1. Left ventricular ejection fraction, by estimation, is <20%. The left ventricle has severely decreased function. The left ventricle demonstrates global hypokinesis. The left ventricular internal cavity size was moderately to severely dilated. There is  mild left ventricular hypertrophy. Left ventricular diastolic parameters were normal.  2. Right ventricular systolic function is mildly reduced. The right ventricular size is not well visualized. There is normal pulmonary artery systolic pressure.  3. Left atrial size was mild to moderately dilated.  4. The mitral valve is normal in structure. Mild mitral valve regurgitation.  5. The aortic valve is tricuspid. Aortic valve regurgitation is not visualized.  6. Aortic no significant ascending aneurysm.  7. The inferior vena cava is dilated in size with >50% respiratory variability, suggesting right atrial pressure of 8 mmHg.  Comparison(s): No prior Echocardiogram.  FINDINGS  Left Ventricle: Left ventricular ejection fraction, by estimation, is <20%. The left ventricle has severely decreased function. The left ventricle demonstrates global hypokinesis. Definity contrast agent was given IV to delineate the left ventricular  endocardial borders. The left ventricular internal  cavity size was moderately to severely dilated. There is mild left ventricular hypertrophy. Left ventricular diastolic parameters were normal.  Right Ventricle: The right ventricular size is not well visualized. Right vetricular wall thickness was not well visualized. Right ventricular systolic function is mildly reduced. There is normal pulmonary artery systolic pressure. The tricuspid  regurgitant velocity is 2.40 m/s, and with an assumed right atrial pressure of 8 mmHg, the estimated right ventricular systolic pressure is 31.0 mmHg.  Left Atrium: Left atrial size was mild to moderately dilated.  Right Atrium: Right atrial size was normal in size.  Pericardium: There is no evidence of pericardial effusion.  Mitral Valve: The mitral valve is normal in structure. Mild mitral valve regurgitation.  Tricuspid Valve: The tricuspid valve is normal in structure. Tricuspid valve regurgitation is trivial.  Aortic Valve: The aortic valve is tricuspid. Aortic valve regurgitation is not visualized. Aortic valve mean gradient measures 4.0 mmHg. Aortic valve peak gradient measures 5.4 mmHg. Aortic valve area, by VTI measures 2.57 cm.  Pulmonic Valve: The pulmonic valve was not well visualized. Pulmonic valve regurgitation is not visualized.  Aorta: No significant ascending aneurysm.  Venous: The inferior vena cava is dilated in size with greater than 50% respiratory variability, suggesting right atrial pressure of 8 mmHg.    LEFT VENTRICLE PLAX 2D LVIDd:         6.70 cm   Diastology LVIDs:         6.20 cm   LV e' medial:    5.35 cm/s LV PW:         1.20 cm   LV E/e' medial:  18.4 LV IVS:        1.10 cm   LV e' lateral:   14.80 cm/s LVOT diam:     2.10 cm   LV E/e' lateral: 6.7 LV SV:         56 LV SV Index:   23 LVOT Area:     3.46 cm    RIGHT VENTRICLE             IVC RV Basal diam:  5.80 cm     IVC diam: 2.80 cm RV Mid diam:    4.10 cm RV S prime:     15.50 cm/s TAPSE (M-mode): 1.5  cm  LEFT ATRIUM  Index        RIGHT ATRIUM           Index LA diam:        5.70 cm  2.31 cm/m   RA Area:     26.20 cm LA Vol (A2C):   149.0 ml 60.35 ml/m  RA Volume:   94.70 ml  38.35 ml/m LA Vol (A4C):   82.0 ml  33.21 ml/m LA Biplane Vol: 116.0 ml 46.98 ml/m  AORTIC VALVE                    PULMONIC VALVE AV Area (Vmax):    3.49 cm     PV Vmax:          0.93 m/s AV Area (Vmean):   3.06 cm     PV Peak grad:     3.5 mmHg AV Area (VTI):     2.57 cm     PR End Diast Vel: 11.56 msec AV Vmax:           116.00 cm/s AV Vmean:          97.100 cm/s AV VTI:            0.218 m AV Peak Grad:      5.4 mmHg AV Mean Grad:      4.0 mmHg LVOT Vmax:         117.00 cm/s LVOT Vmean:        85.900 cm/s LVOT VTI:          0.162 m LVOT/AV VTI ratio: 0.74   AORTA Ao Root diam: 3.60 cm Ao Asc diam:  3.30 cm  MITRAL VALVE               TRICUSPID VALVE MV Area (PHT): 7.90 cm    TR Peak grad:   23.0 mmHg MV Decel Time: 96 msec     TR Vmax:        240.00 cm/s MR Peak grad: 58.4 mmHg MR Mean grad: 41.0 mmHg    SHUNTS MR Vmax:      382.00 cm/s  Systemic VTI:  0.16 m MR Vmean:     311.0 cm/s   Systemic Diam: 2.10 cm MV E velocity: 98.50 cm/s MV A velocity: 52.50 cm/s MV E/A ratio:  1.88  Photographer signed by Carolan Clines Signature Date/Time: 06/19/2021/3:02:05 PM       Final     *Note: Due to a large number of results and/or encounters for the requested time period, some results have not been displayed. A complete set of results can be found in Results Review.    *** CAD  - On Aspirin, statin, betablocker, Plavix                 - *followed by cardiology                - last cardiac cath       ***DM 2 -  Lab Results  Component Value Date   HGBA1C 7.0 (H) 01/19/2022   ****on insulin, PO meds only, diet controlled  ***Hypothyroidism:  Lab Results  Component Value Date   TSH 1.260 06/19/2021   on synthroid  *** Morbid obesity-   BMI Readings from  Last 1 Encounters:  06/30/22 37.76 kg/m     *** Asthma -well *** controlled on home inhalers/ nebs                         ***  last no prior***admission  ***                       No ***history of intubation  *** COPD - not **followed by pulmonology *** not  on baseline oxygen  *L,    *** OSA -on nocturnal oxygen, *CPAP, *noncompliant with CPAP  *** Hx of CVA - *with/out residual deficits on Aspirin 81 mg, 325, Plavix  ***A. Fib -  - CHA2DS2 vas score **** CHA2DS2/VAS Stroke Risk Points      N/A >= 2 Points: High Risk  1 - 1.99 Points: Medium Risk  0 Points: Low Risk    Last Change: N/A      This score determines the patient's risk of having a stroke if the  patient has atrial fibrillation.      This score is not applicable to this patient. Components are not  calculated.     current  on anticoagulation with ****Coumadin  ***Xarelto,* Eliquis,  *** Not on anticoagulation secondary to Risk of Falls, *** recurrent bleeding         -  Rate control:  Currently controlled with ***Toprolol,  *Metoprolol,* Diltiazem, *Coreg          - Rhythm control: *** amiodarone, *flecainide  ***Hx of DVT/PE on - anticoagulation with ****Coumadin  ***Xarelto,* Eliquis,     ***CKD stage III*- baseline Cr **** Estimated Creatinine Clearance: 106.1 mL/min (by C-G formula based on SCr of 1.21 mg/dL).  Lab Results  Component Value Date   CREATININE 1.21 06/30/2022   CREATININE 0.92 01/19/2022   CREATININE 0.89 06/23/2021     **** Liver disease Computed MELD 3.0 unavailable. Necessary lab results were not found in the last year. Computed MELD-Na unavailable. Necessary lab results were not found in the last year.    ***BPH - on Flomax, Proscar    *** Dementia - on Aricept** Nemenda  *** Chronic anemia - baseline hg Hemoglobin & Hematocrit  Recent Labs    06/30/22 1810  HGB 11.5*   Iron/TIBC/Ferritin/ %Sat No results found for: "IRON", "TIBC", "FERRITIN", "IRONPCTSAT"   Seizure DO  - las seizure *** currently on     While in ER: Clinical Course as of 06/30/22 2327  Sat Jun 30, 2022  2018 Chest x-ray shows pulmonary vascular congestion [JK]  2018 Troponin I (High Sensitivity)(!) Troponin elevated at 72.  Lactic acid level normal at 1.8 [JK]  2019 Sodium slightly decreased compared to previous [JK]  2019 Trop increased since last [JK]  2222 Brain natriuretic peptide(!) BNP is elevated, increased compared to previous [JK]  2222 SARS Coronavirus 2 by RT PCR (hospital order, performed in [facility] hospital lab) *cepheid single result test* Anterior Nasal Swab Covid flu negative [JK]  2230 Acute pe noted on CT [JK]  2300 Case discussed with Dr Adela Glimpse [JK]    Clinical Course User Index [JK] Linwood Dibbles, MD         Lab Orders         Blood culture (routine x 2)         SARS Coronavirus 2 by RT PCR (hospital order, performed in [facility] hospital lab) *cepheid single result test* Anterior Nasal Swab         Basic metabolic panel         CBC         Brain natriuretic peptide         Urinalysis, w/ Reflex to Culture (Infection Suspected) -Urine, Clean  Catch         Magnesium         Heparin level (unfractionated)         Heparin level (unfractionated)         CBC      CT HEAD *** NON acute    CXR - ***NON acute  CTabd/pelvis - ***nonacute  CTA chest - ***nonacute, no PE, * no evidence of infiltrate  Following Medications were ordered in ER: Medications  azithromycin (ZITHROMAX) 500 mg in sodium chloride 0.9 % 250 mL IVPB (500 mg Intravenous New Bag/Given 06/30/22 2233)  furosemide (LASIX) injection 40 mg (has no administration in time range)  potassium chloride SA (KLOR-CON M) CR tablet 40 mEq (has no administration in time range)  morphine (PF) 4 MG/ML injection 4 mg (has no administration in time range)  heparin bolus via infusion 4,000 Units (has no administration in time range)  heparin ADULT infusion 100 units/mL (25000 units/243mL) (has no  administration in time range)  0.9 %  sodium chloride infusion (has no administration in time range)  norepinephrine (LEVOPHED) 4mg  in (0.016 mg/mL) premix infusion (has no administration in time range)  acetaminophen (TYLENOL) tablet 650 mg (650 mg Oral Given 06/30/22 1815)  sodium chloride 0.9 % bolus 500 mL (0 mLs Intravenous Stopped 06/30/22 2212)  cefTRIAXone (ROCEPHIN) 1 g in sodium chloride 0.9 % 100 mL IVPB (0 g Intravenous Stopped 06/30/22 2127)  iohexol (OMNIPAQUE) 350 MG/ML injection 75 mL (75 mLs Intravenous Contrast Given 06/30/22 2214)    _______________________________________________________ ER Provider Called:     DrMarland Kitchen  They Recommend admit to medicine *** Will see in AM  ***SEEN in ER   ED Triage Vitals  Enc Vitals Group     BP 06/30/22 1755 105/73     Pulse Rate 06/30/22 1755 (!) 116     Resp 06/30/22 1755 (!) 26     Temp 06/30/22 1755 (!) 102.1 F (38.9 C)     Temp Source 06/30/22 2212 Oral     SpO2 06/30/22 1755 96 %     Weight 06/30/22 1806 294 lb 1.5 oz (133.4 kg)     Height 06/30/22 1806 6\' 2"  (1.88 m)     Head Circumference --      Peak Flow --      Pain Score 06/30/22 1805 8     Pain Loc --      Pain Edu? --      Excl. in GC? --   TMAX(24)@     _________________________________________ Significant initial  Findings: Abnormal Labs Reviewed  BASIC METABOLIC PANEL - Abnormal; Notable for the following components:      Result Value   Sodium 133 (*)    Glucose, Bld 139 (*)    BUN 31 (*)    Calcium 8.0 (*)    All other components within normal limits  CBC - Abnormal; Notable for the following components:   RBC 4.20 (*)    Hemoglobin 11.5 (*)    HCT 35.9 (*)    All other components within normal limits  BRAIN NATRIURETIC PEPTIDE - Abnormal; Notable for the following components:   B Natriuretic Peptide 2,206.2 (*)    All other components within normal limits  URINALYSIS, W/ REFLEX TO CULTURE (INFECTION SUSPECTED) - Abnormal; Notable for the  following components:   Color, Urine AMBER (*)    APPearance HAZY (*)    Hgb urine dipstick SMALL (*)    Protein, ur 30 (*)  Bacteria, UA FEW (*)    All other components within normal limits  TROPONIN I (HIGH SENSITIVITY) - Abnormal; Notable for the following components:   Troponin I (High Sensitivity) 72 (*)    All other components within normal limits  TROPONIN I (HIGH SENSITIVITY) - Abnormal; Notable for the following components:   Troponin I (High Sensitivity) 72 (*)    All other components within normal limits      _________________________ Troponin ***ordered Cardiac Panel (last 3 results) Recent Labs    06/30/22 1810 06/30/22 2010  TROPONINIHS 72* 72*     ECG: Ordered Personally reviewed and interpreted by me showing: HR : *** Rhythm: *NSR, Sinus tachycardia * A.fib. W RVR, RBBB, LBBB, Paced Ischemic changes*nonspecific changes, no evidence of ischemic changes QTC*  BNP (last 3 results) Recent Labs    06/30/22 1810  BNP 2,206.2*     COVID-19 Labs  No results for input(s): "DDIMER", "FERRITIN", "LDH", "CRP" in the last 72 hours.  Lab Results  Component Value Date   SARSCOV2NAA NEGATIVE 06/30/2022       ____________________ This patient meets SIRS Criteria and may be septic. SIRS = Systemic Inflammatory Response Syndrome  Order a lactic acid level if needed AND/OR Initiate the sepsis protocol with the attached order set OR Click "Treating Associated Infection or Illness" if the patient is being treated for an infection that is a known cause of these abnormalities     The recent clinical data is shown below. Vitals:   06/30/22 2130 06/30/22 2145 06/30/22 2212 06/30/22 2230  BP: 110/78 103/80  104/72  Pulse: (!) 101 (!) 102    Resp: (!) 28 (!) 28  (!) 21  Temp:   99.1 F (37.3 C)   TempSrc:   Oral   SpO2: 98% 100%  95%  Weight:      Height:            WBC     Component Value Date/Time   WBC 9.4 06/30/2022 1810   LYMPHSABS 2.6  06/18/2021 1735   MONOABS 0.6 06/18/2021 1735   EOSABS 0.1 06/18/2021 1735   BASOSABS 0.0 06/18/2021 1735        Lactic Acid, Venous    Component Value Date/Time   LATICACIDVEN 1.8 06/30/2022 1811      Lactic Acid, Venous    Component Value Date/Time   LATICACIDVEN 1.8 06/30/2022 1811    Procalcitonin *** Ordered      UA *** no evidence of UTI  ***Pending ***not ordered   Urine analysis:    Component Value Date/Time   COLORURINE AMBER (A) 06/30/2022 2230   APPEARANCEUR HAZY (A) 06/30/2022 2230   LABSPEC 1.024 06/30/2022 2230   PHURINE 5.0 06/30/2022 2230   GLUCOSEU NEGATIVE 06/30/2022 2230   HGBUR SMALL (A) 06/30/2022 2230   BILIRUBINUR NEGATIVE 06/30/2022 2230   KETONESUR NEGATIVE 06/30/2022 2230   PROTEINUR 30 (A) 06/30/2022 2230   NITRITE NEGATIVE 06/30/2022 2230   LEUKOCYTESUR NEGATIVE 06/30/2022 2230    Results for orders placed or performed during the hospital encounter of 06/30/22  SARS Coronavirus 2 by RT PCR (hospital order, performed in [facility] hospital lab) *cepheid single result test* Anterior Nasal Swab     Status: None   Collection Time: 06/30/22  9:01 PM   Specimen: Anterior Nasal Swab  Result Value Ref Range Status   SARS Coronavirus 2 by RT PCR NEGATIVE NEGATIVE Final    Comment: Performed at [facility], [address] [place],  [place] [address]    ABX started Antibiotics Given (last 72 hours)     Date/Time Action Medication Dose Rate   06/30/22 2057 New Bag/Given   cefTRIAXone (ROCEPHIN) 1 g in sodium chloride 0.9 % 100 mL IVPB 1 g 200 mL/hr   06/30/22 2233 New Bag/Given   azithromycin (ZITHROMAX) 500 mg in sodium chloride 0.9 % 250 mL IVPB 500 mg 250 mL/hr       No results found for the last 90 days.     ________________________________________________________________  Arterial ***Venous  Blood Gas result:  pH *** pCO2 ***; pO2 ***;     %O2 Sat ***.  ABG    Component Value Date/Time   PHART 7.459 (H)  06/20/2021 1346   PCO2ART 43.6 06/20/2021 1346   PO2ART 84 06/20/2021 1346   HCO3 30.9 (H) 06/20/2021 1346   TCO2 32 06/20/2021 1346   O2SAT 97 06/20/2021 1346       __________________________________________________________ Recent Labs  Lab 06/30/22 1810  NA 133*  K 3.6  CO2 24  GLUCOSE 139*  BUN 31*  CREATININE 1.21  CALCIUM 8.0*    Cr  * stable,  Up from baseline see below Lab Results  Component Value Date   CREATININE 1.21 06/30/2022   CREATININE 0.92 01/19/2022   CREATININE 0.89 06/23/2021    No results for input(s): "AST", "ALT", "ALKPHOS", "BILITOT", "PROT", "ALBUMIN" in the last 168 hours. Lab Results  Component Value Date   CALCIUM 8.0 (L) 06/30/2022          Plt: Lab Results  Component Value Date   PLT 239 06/30/2022         Recent Labs  Lab 06/30/22 1810  WBC 9.4  HGB 11.5*  HCT 35.9*  MCV 85.5  PLT 239    HG/HCT * stable,  Down *Up from baseline see below    Component Value Date/Time   HGB 11.5 (L) 06/30/2022 1810   HCT 35.9 (L) 06/30/2022 1810   MCV 85.5 06/30/2022 1810      No results for input(s): "LIPASE", "AMYLASE" in the last 168 hours. No results for input(s): "AMMONIA" in the last 168 hours.    .lab  _______________________________________________ Hospitalist was called for admission for *** Acute pulmonary embolism without acute cor pulmonale, unspecified pulmonary embolism type (HCC) ***  Congestive heart failure, unspecified HF chronicity, unspecified heart failure type (HCC) ***    The following Work up has been ordered so far:  Orders Placed This Encounter  Procedures  . Critical Care  . Blood culture (routine x 2)  . SARS Coronavirus 2 by RT PCR (hospital order, performed in [facility] hospital lab) *cepheid single result test* Anterior Nasal Swab  . DG Chest 2 View  . CT Angio Chest PE W and/or Wo Contrast  . Basic metabolic panel  . CBC  . Brain natriuretic peptide  . Urinalysis, w/ Reflex to  Culture (Infection Suspected) -Urine, Clean Catch  . Magnesium  . Heparin level (unfractionated)  . Heparin level (unfractionated)  . CBC  . Document Height and Actual Weight  . Vasopressor infusion via peripheral IV access  . Assess IV site every 2 hours for vasopressor extravasation  . IV site should be re-confirmed by blood return every 24 hours of vasopressor administration  . Peripheral administration of vasopressors should not exceed 72 hours  . Use ivWatch to detect peripheral IV (PIV) infiltrations and extravasations  . Use ultrasound guided IV for peripheral infusion (may start medication in non-US placed line)  .  heparin per pharmacy consult  . Consult to hospitalist  . Consult to intensivist  . pharmacy consult  . Airborne and Contact precautions  . Bipap  . EKG 12-Lead  . ED EKG  . EKG 12-Lead  . EKG 12-Lead     OTHER Significant initial  Findings:  labs showing:     DM  labs:  HbA1C: Recent Labs    01/19/22 1554  HGBA1C 7.0*       CBG (last 3)  No results for input(s): "GLUCAP" in the last 72 hours.        Cultures: No results found for: "SDES", "SPECREQUEST", "CULT", "REPTSTATUS"   Radiological Exams on Admission: CT Angio Chest PE W and/or Wo Contrast  Result Date: 06/30/2022 CLINICAL DATA:  Cough, short of breath, febrile, suspected pulmonary embolus EXAM: CT ANGIOGRAPHY CHEST WITH CONTRAST TECHNIQUE: Multidetector CT imaging of the chest was performed using the standard protocol during bolus administration of intravenous contrast. Multiplanar CT image reconstructions and MIPs were obtained to evaluate the vascular anatomy. RADIATION DOSE REDUCTION: This exam was performed according to the departmental dose-optimization program which includes automated exposure control, adjustment of the mA and/or kV according to patient size and/or use of iterative reconstruction technique. CONTRAST:  75mL OMNIPAQUE IOHEXOL 350 MG/ML SOLN COMPARISON:  06/30/2022,  06/18/2021 FINDINGS: Cardiovascular: This is a technically adequate evaluation of the pulmonary vasculature. There is a proximal segmental pulmonary embolus within the right lower lobe. Minimal clot burden. No evidence of right heart strain, with chronic dilatation of the cardiac chambers again noted. Continued cardiomegaly without pericardial effusion. Normal caliber of the thoracic aorta. Mediastinum/Nodes: No enlarged mediastinal, hilar, or axillary lymph nodes. Thyroid gland, trachea, and esophagus demonstrate no significant findings. Lungs/Pleura: No acute airspace disease, effusion, or pneumothorax. Scattered hypoventilatory changes are seen at the lung bases. Sub solid 9 mm subpleural right lower lobe pulmonary nodule reference image 100/7 is noted, new since prior exam. Upper Abdomen: No acute abnormality. Musculoskeletal: No acute or destructive bony abnormalities. Reconstructed images demonstrate no additional findings. Review of the MIP images confirms the above findings. IMPRESSION: 1. Right lower lobe proximal segmental pulmonary embolus. Minimal clot burden with no evidence of right heart strain. 2. Stable cardiomegaly, with marked dilatation of the cardiac chambers again noted. No pericardial effusion. 3. Right part-solid pulmonary nodule measuring 9 mm. Per Fleischner Society Guidelines, recommend a non-contrast Chest CT at 3-6 months to confirm persistence. If unchanged and the solid component remains < 6 mm, an annual non-contrast Chest CT should be performed for 5 years. If the solid component is 6-8 mm on follow-up, recommend biopsy or resection. If the solid component is > 8 mm on follow-up, recommend PET/CT, biopsy or resection. These guidelines do not apply to immunocompromised patients and patients with cancer. Follow up in patients with significant comorbidities as clinically warranted. For lung cancer screening, adhere to Lung-RADS guidelines. Reference: Radiology. 2017; 284(1):228-43.  Critical Value/emergent results were called by telephone at the time of interpretation on 06/30/2022 at 10:30 pm to provider [facility] , who verbally acknowledged these results. Electronically Signed   By: Sharlet Salina M.D.   On: 06/30/2022 22:31   DG Chest 2 View  Result Date: 06/30/2022 CLINICAL DATA:  Elevated temperature, cough and shortness of breath. EXAM: CHEST - 2 VIEW COMPARISON:  Chest radiograph dated June 18, 2021 FINDINGS: The heart is enlarged. Pulmonary vascular congestion and bilateral perihilar interstitial opacities suggesting mild pulmonary edema. No appreciable pleural effusion. IMPRESSION: Cardiomegaly with pulmonary vascular congestion  and mild pulmonary edema. Electronically Signed   By: Larose Hires D.O.   On: 06/30/2022 18:50   _______________________________________________________________________________________________________ Latest  Blood pressure 104/72, pulse (!) 102, temperature 99.1 F (37.3 C), temperature source Oral, resp. rate (!) 21, height 6\' 2"  (1.88 m), weight 133.4 kg, SpO2 95 %.   Vitals  labs and radiology finding personally reviewed  Review of Systems:    Pertinent positives include: ***  Constitutional:  No weight loss, night sweats, Fevers, chills, fatigue, weight loss  HEENT:  No headaches, Difficulty swallowing,Tooth/dental problems,Sore throat,  No sneezing, itching, ear ache, nasal congestion, post nasal drip,  Cardio-vascular:  No chest pain, Orthopnea, PND, anasarca, dizziness, palpitations.no Bilateral lower extremity swelling  GI:  No heartburn, indigestion, abdominal pain, nausea, vomiting, diarrhea, change in bowel habits, loss of appetite, melena, blood in stool, hematemesis Resp:  no shortness of breath at rest. No dyspnea on exertion, No excess mucus, no productive cough, No non-productive cough, No coughing up of blood.No change in color of mucus.No wheezing. Skin:  no rash or lesions. No jaundice GU:  no dysuria, change in  color of urine, no urgency or frequency. No straining to urinate.  No flank pain.  Musculoskeletal:  No joint pain or no joint swelling. No decreased range of motion. No back pain.  Psych:  No change in mood or affect. No depression or anxiety. No memory loss.  Neuro: no localizing neurological complaints, no tingling, no weakness, no double vision, no gait abnormality, no slurred speech, no confusion  All systems reviewed and apart from HOPI all are negative _______________________________________________________________________________________________ Past Medical History:   Past Medical History:  Diagnosis Date  . Cardiomyopathy (HCC)   . CHF (congestive heart failure) (HCC)   . Diabetes mellitus without complication (HCC)   . DVT (deep venous thrombosis) (HCC)   . Hyperlipidemia   . Hypertension       Past Surgical History:  Procedure Laterality Date  . RIGHT/LEFT HEART CATH AND CORONARY ANGIOGRAPHY N/A 06/20/2021   Procedure: RIGHT/LEFT HEART CATH AND CORONARY ANGIOGRAPHY;  Surgeon: Elder Negus, MD;  Location: [facility];  Service: Cardiovascular;  Laterality: N/A;    Social History:  Ambulatory *** independently cane, walker  wheelchair bound, bed bound     reports that he has never smoked. He does not have any smokeless tobacco history on file. He reports that he does not drink alcohol and does not use drugs.     Family History: *** History reviewed. No pertinent family history. ______________________________________________________________________________________________ Allergies: No Known Allergies   Prior to Admission medications   Medication Sig Start Date End Date Taking? Authorizing Provider  apixaban (ELIQUIS) 5 MG TABS tablet Take 1 tablet (5 mg total) by mouth 2 (two) times daily. 11/14/21  Yes Tolia, Sunit, DO  Artificial Tear Ointment (DRY EYES OP) Place 2 drops into both eyes 2 (two) times daily as needed (dry eyes).    [provider]  losartan (COZAAR) 25 MG tablet Take 1 tablet (25 mg total) by mouth daily at 10 pm. 01/18/22 02/17/22  Tolia, Sunit, DO  metoprolol succinate (TOPROL XL) 50 MG 24 hr tablet Take 1 tablet (50 mg total) by mouth every morning. Hold if systolic blood pressure (top number) less than 100 mmHg or pulse less than 60 bpm. 01/18/22 04/18/22  Tolia, Sunit, DO  sacubitril-valsartan (ENTRESTO) 49-51 MG Take 1 tablet by mouth 2 (two) times daily. 01/30/22   Tolia, Sunit, DO  spironolactone (ALDACTONE) 25 MG tablet  Take 1 tablet (25 mg total) by mouth in the morning. 01/18/22 04/18/22  Tolia, Sunit, DO  torsemide (DEMADEX) 10 MG tablet Take 1 tablet (10 mg total) by mouth every morning. 01/18/22 02/17/22  Tessa Lerner, DO    ___________________________________________________________________________________________________ Physical Exam:    06/30/2022   10:30 PM 06/30/2022    9:45 PM 06/30/2022    9:30 PM  Vitals with BMI  Systolic 104 103 161  Diastolic 72 80 78  Pulse  102 096     1. General:  in No ***Acute distress***increased work of breathing ***complaining of severe pain****agitated * Chronically ill *well *cachectic *toxic acutely ill -appearing 2. Psychological: Alert and *** Oriented 3. Head/ENT:   Moist *** Dry Mucous Membranes                          Head Non traumatic, neck supple                          Normal *** Poor Dentition 4. SKIN: normal *** decreased Skin turgor,  Skin clean Dry and intact no rash    5. Heart: Regular rate and rhythm no*** Murmur, no Rub or gallop 6. Lungs: ***Clear to auscultation bilaterally, no wheezes or crackles   7. Abdomen: Soft, ***non-tender, Non distended *** obese ***bowel sounds present 8. Lower extremities: no clubbing, cyanosis, no ***edema 9. Neurologically Grossly intact, moving all 4 extremities equally *** strength 5 out of 5 in all 4 extremities cranial nerves II through XII intact 10. MSK: Normal range of motion    Chart has  been reviewed  ______________________________________________________________________________________________  Assessment/Plan  ***  Admitted for *** Acute pulmonary embolism without acute cor pulmonale, unspecified pulmonary embolism type (HCC) ***  Congestive heart failure, unspecified HF chronicity, unspecified heart failure type (HCC) ***    Present on Admission: **None**     No problem-specific Assessment & Plan notes found for this encounter.    Other plan as per orders.  DVT prophylaxis:  SCD *** Lovenox   heparin bolus via infusion 4,000 Units Start: 06/30/22 2330    Code Status:    Code Status: Prior FULL CODE *** DNR/DNI ***comfort care as per patient ***family  I had personally discussed CODE STATUS with patient and family* I had spent *min discussing goals of care and CODE STATUS ACP has been reviewed ***   Family Communication:   Family not at  Bedside  plan of care was discussed on the phone with *** Son, Daughter, Wife, Husband, Sister, Brother , father, mother  Diet    Disposition Plan:   *** likely will need placement for rehabilitation                          Back to current facility when stable                            To home once workup is complete and patient is stable  ***Following barriers for discharge:                            Electrolytes corrected                               Anemia corrected  Pain controlled with PO medications                               Afebrile, white count improving able to transition to PO antibiotics                             Will need to be able to tolerate PO                            Will likely need home health, home O2, set up                           Will need consultants to evaluate patient prior to discharge  ****EXPECT DC tomorrow       Consult Orders  (From admission, onward)           Start     Ordered   06/30/22 2250  Consult to hospitalist  Pg by  Viviann Spare  Once       Provider:  (Not yet assigned)  Question Answer Comment  Place call to: [facility]   Reason for Consult Admit      06/30/22 2249                              ***Would benefit from PT/OT eval prior to DC  Ordered                   Swallow eval - SLP ordered                   Diabetes care coordinator                   Transition of care consulted                   Nutrition    consulted                  Wound care  consulted                   Palliative care    consulted                   Behavioral health  consulted                    Consults called: ***     Admission status:  ED Disposition     ED Disposition  Admit   Condition  --   Comment  The patient appears reasonably stabilized for admission considering the current resources, flow, and capabilities available in the ED at this time, and I doubt any other [facility] requiring further screening and/or treatment in the ED prior to admission is  present.           Obs***  ***  inpatient     I Expect 2 midnight stay secondary to severity of patient's current illness need for inpatient interventions justified by the following: ***hemodynamic instability despite optimal treatment (tachycardia *hypotension * tachypnea *hypoxia, hypercapnia) * Severe lab/radiological/exam abnormalities including:     and extensive comorbidities including: *substance abuse  *Chronic pain *DM2  * CHF * CAD  * COPD/asthma *Morbid Obesity * CKD *dementia *liver  disease *history of stroke with residual deficits *  malignancy, * sickle cell disease  History of amputation Chronic anticoagulation  That are currently affecting medical management.   I expect  patient to be hospitalized for 2 midnights requiring inpatient medical care.  Patient is at high risk for adverse outcome (such as loss of life or disability) if not treated.  Indication for inpatient stay as follows:  Severe change from baseline  regarding mental status Hemodynamic instability despite maximal medical therapy,  ongoing suicidal ideations,  severe pain requiring acute inpatient management,  inability to maintain oral hydration   persistent chest pain despite medical management Need for operative/procedural  intervention New or worsening hypoxia   Need for IV antibiotics, IV fluids, IV rate controling medications, IV antihypertensives, IV pain medications, IV anticoagulation, need for biPAP    Level of care   *** tele  For 12H 24H     medical floor       progressive tele indefinitely please discontinue once patient no longer qualifies COVID-19 Labs    Lab Results  Component Value Date   SARSCOV2NAA NEGATIVE 06/30/2022     Precautions: admitted as *** Covid Negative  ***asymptomatic screening protocol****PUI *** covid positive Airborne and Contact precautions ***If Covid PCR is negative  - please DC precautions - would need additional investigation given very high risk for false native test result    Critical***  Patient is critically ill due to  hemodynamic instability * respiratory failure *severe sepsis* ongoing chest pain*  They are at high risk for life/limb threatening clinical deterioration requiring frequent reassessment and modifications of care.  Services provided include examination of the patient, review of relevant ancillary tests, prescription of lifesaving therapies, review of medications and prophylactic therapy.  Total critical care time excluding separately billable procedures: 60*  Minutes.    Therisa Doyne 06/30/2022, 11:27 PM ***  [facility]     after 2 AM please page floor coverage PA If 7AM-7PM, please contact the day team taking care of the patient using [email or web address]

## 2022-07-01 ENCOUNTER — Inpatient Hospital Stay (HOSPITAL_COMMUNITY): Payer: 59

## 2022-07-01 ENCOUNTER — Encounter (HOSPITAL_COMMUNITY): Payer: 59

## 2022-07-01 DIAGNOSIS — I2489 Other forms of acute ischemic heart disease: Secondary | ICD-10-CM | POA: Diagnosis present

## 2022-07-01 DIAGNOSIS — Z86718 Personal history of other venous thrombosis and embolism: Secondary | ICD-10-CM

## 2022-07-01 DIAGNOSIS — I480 Paroxysmal atrial fibrillation: Secondary | ICD-10-CM | POA: Diagnosis present

## 2022-07-01 DIAGNOSIS — Z91199 Patient's noncompliance with other medical treatment and regimen due to unspecified reason: Secondary | ICD-10-CM

## 2022-07-01 DIAGNOSIS — E119 Type 2 diabetes mellitus without complications: Secondary | ICD-10-CM | POA: Diagnosis present

## 2022-07-01 DIAGNOSIS — I5082 Biventricular heart failure: Secondary | ICD-10-CM | POA: Diagnosis present

## 2022-07-01 DIAGNOSIS — E871 Hypo-osmolality and hyponatremia: Secondary | ICD-10-CM | POA: Diagnosis present

## 2022-07-01 DIAGNOSIS — I509 Heart failure, unspecified: Secondary | ICD-10-CM | POA: Diagnosis not present

## 2022-07-01 DIAGNOSIS — J9601 Acute respiratory failure with hypoxia: Secondary | ICD-10-CM

## 2022-07-01 DIAGNOSIS — E876 Hypokalemia: Secondary | ICD-10-CM | POA: Diagnosis present

## 2022-07-01 DIAGNOSIS — R570 Cardiogenic shock: Secondary | ICD-10-CM

## 2022-07-01 DIAGNOSIS — I2699 Other pulmonary embolism without acute cor pulmonale: Secondary | ICD-10-CM | POA: Diagnosis present

## 2022-07-01 DIAGNOSIS — I50814 Right heart failure due to left heart failure: Secondary | ICD-10-CM

## 2022-07-01 DIAGNOSIS — I340 Nonrheumatic mitral (valve) insufficiency: Secondary | ICD-10-CM | POA: Diagnosis present

## 2022-07-01 DIAGNOSIS — I471 Supraventricular tachycardia, unspecified: Secondary | ICD-10-CM | POA: Diagnosis not present

## 2022-07-01 DIAGNOSIS — I428 Other cardiomyopathies: Secondary | ICD-10-CM | POA: Diagnosis present

## 2022-07-01 DIAGNOSIS — R509 Fever, unspecified: Secondary | ICD-10-CM

## 2022-07-01 DIAGNOSIS — I5023 Acute on chronic systolic (congestive) heart failure: Secondary | ICD-10-CM

## 2022-07-01 DIAGNOSIS — I2722 Pulmonary hypertension due to left heart disease: Secondary | ICD-10-CM | POA: Diagnosis present

## 2022-07-01 DIAGNOSIS — I4892 Unspecified atrial flutter: Secondary | ICD-10-CM | POA: Diagnosis present

## 2022-07-01 DIAGNOSIS — R7989 Other specified abnormal findings of blood chemistry: Secondary | ICD-10-CM

## 2022-07-01 DIAGNOSIS — R579 Shock, unspecified: Secondary | ICD-10-CM | POA: Diagnosis present

## 2022-07-01 DIAGNOSIS — R578 Other shock: Secondary | ICD-10-CM | POA: Diagnosis present

## 2022-07-01 DIAGNOSIS — I493 Ventricular premature depolarization: Secondary | ICD-10-CM | POA: Diagnosis present

## 2022-07-01 DIAGNOSIS — I110 Hypertensive heart disease with heart failure: Secondary | ICD-10-CM | POA: Diagnosis present

## 2022-07-01 DIAGNOSIS — D638 Anemia in other chronic diseases classified elsewhere: Secondary | ICD-10-CM | POA: Diagnosis present

## 2022-07-01 DIAGNOSIS — I272 Pulmonary hypertension, unspecified: Secondary | ICD-10-CM

## 2022-07-01 DIAGNOSIS — E785 Hyperlipidemia, unspecified: Secondary | ICD-10-CM | POA: Diagnosis present

## 2022-07-01 DIAGNOSIS — R609 Edema, unspecified: Secondary | ICD-10-CM | POA: Diagnosis not present

## 2022-07-01 DIAGNOSIS — Z1152 Encounter for screening for COVID-19: Secondary | ICD-10-CM | POA: Diagnosis not present

## 2022-07-01 DIAGNOSIS — R55 Syncope and collapse: Secondary | ICD-10-CM

## 2022-07-01 DIAGNOSIS — K7200 Acute and subacute hepatic failure without coma: Secondary | ICD-10-CM | POA: Diagnosis present

## 2022-07-01 LAB — HEPARIN LEVEL (UNFRACTIONATED)
Heparin Unfractionated: <0.1 IU/mL — ABNORMAL LOW (ref 0.30–0.70)
Heparin Unfractionated: 0.19 IU/mL — ABNORMAL LOW (ref 0.30–0.70)

## 2022-07-01 LAB — POCT I-STAT 7, (LYTES, BLD GAS, ICA,H+H)
Acid-base deficit: 1 mmol/L (ref 0.0–2.0)
Bicarbonate: 23 mmol/L (ref 20.0–28.0)
Calcium, Ion: 1.07 mmol/L — ABNORMAL LOW (ref 1.15–1.40)
HCT: 36 % — ABNORMAL LOW (ref 39.0–52.0)
Hemoglobin: 12.2 g/dL — ABNORMAL LOW (ref 13.0–17.0)
O2 Saturation: 98 %
Patient temperature: 98.5
Potassium: 3.5 mmol/L (ref 3.5–5.1)
Sodium: 135 mmol/L (ref 135–145)
TCO2: 24 mmol/L (ref 22–32)
pCO2 arterial: 35.5 mmHg (ref 32–48)
pH, Arterial: 7.419 (ref 7.35–7.45)
pO2, Arterial: 97 mmHg (ref 83–108)

## 2022-07-01 LAB — COMPREHENSIVE METABOLIC PANEL
ALT: 448 U/L — ABNORMAL HIGH (ref 0–44)
AST: 192 U/L — ABNORMAL HIGH (ref 15–41)
Albumin: 3 g/dL — ABNORMAL LOW (ref 3.5–5.0)
Alkaline Phosphatase: 79 U/L (ref 38–126)
Anion gap: 11 (ref 5–15)
BUN: 27 mg/dL — ABNORMAL HIGH (ref 6–20)
CO2: 22 mmol/L (ref 22–32)
Calcium: 7.8 mg/dL — ABNORMAL LOW (ref 8.9–10.3)
Chloride: 99 mmol/L (ref 98–111)
Creatinine, Ser: 1.06 mg/dL (ref 0.61–1.24)
GFR, Estimated: >60 mL/min (ref >60)
Glucose, Bld: 145 mg/dL — ABNORMAL HIGH (ref 70–99)
Potassium: 3.6 mmol/L (ref 3.5–5.1)
Sodium: 132 mmol/L — ABNORMAL LOW (ref 135–145)
Total Bilirubin: 3.1 mg/dL — ABNORMAL HIGH (ref 0.3–1.2)
Total Protein: 6.6 g/dL (ref 6.5–8.1)

## 2022-07-01 LAB — COOXEMETRY PANEL
Carboxyhemoglobin: 1.4 % (ref 0.5–1.5)
Carboxyhemoglobin: 2.3 % — ABNORMAL HIGH (ref 0.5–1.5)
Methemoglobin: <0.7 % (ref 0.0–1.5)
Methemoglobin: <0.7 % (ref 0.0–1.5)
O2 Saturation: 57.8 %
O2 Saturation: 74 %
Total hemoglobin: 12.5 g/dL (ref 12.0–16.0)
Total hemoglobin: 11.9 g/dL — ABNORMAL LOW (ref 12.0–16.0)

## 2022-07-01 LAB — ECHOCARDIOGRAM COMPLETE
Area-P 1/2: 3.6 cm2
Calc EF: 18.6 %
Est EF: <20
Height: 74 in
MV M vel: 3.54 m/s
MV Peak grad: 50.1 mmHg
Radius: 0.6 cm
S' Lateral: 6.7 cm
Single Plane A2C EF: 6 %
Single Plane A4C EF: 24.6 %
Weight: 4402.15 oz

## 2022-07-01 LAB — GLUCOSE, CAPILLARY
Glucose-Capillary: 141 mg/dL — ABNORMAL HIGH (ref 70–99)
Glucose-Capillary: 147 mg/dL — ABNORMAL HIGH (ref 70–99)
Glucose-Capillary: 159 mg/dL — ABNORMAL HIGH (ref 70–99)
Glucose-Capillary: 159 mg/dL — ABNORMAL HIGH (ref 70–99)

## 2022-07-01 LAB — BRAIN NATRIURETIC PEPTIDE: B Natriuretic Peptide: 1884.1 pg/mL — ABNORMAL HIGH (ref 0.0–100.0)

## 2022-07-01 LAB — PROTIME-INR
INR: 2 — ABNORMAL HIGH (ref 0.8–1.2)
Prothrombin Time: 23.1 seconds — ABNORMAL HIGH (ref 11.4–15.2)

## 2022-07-01 LAB — CBC
HCT: 34.8 % — ABNORMAL LOW (ref 39.0–52.0)
Hemoglobin: 11.5 g/dL — ABNORMAL LOW (ref 13.0–17.0)
MCH: 28 pg (ref 26.0–34.0)
MCHC: 33 g/dL (ref 30.0–36.0)
MCV: 84.9 fL (ref 80.0–100.0)
Platelets: 218 10*3/uL (ref 150–400)
RBC: 4.1 MIL/uL — ABNORMAL LOW (ref 4.22–5.81)
RDW: 15.3 % (ref 11.5–15.5)
WBC: 9.9 10*3/uL (ref 4.0–10.5)
nRBC: 0 % (ref 0.0–0.2)

## 2022-07-01 LAB — BASIC METABOLIC PANEL
Anion gap: 15 (ref 5–15)
BUN: 26 mg/dL — ABNORMAL HIGH (ref 6–20)
CO2: 23 mmol/L (ref 22–32)
Calcium: 7.8 mg/dL — ABNORMAL LOW (ref 8.9–10.3)
Chloride: 97 mmol/L — ABNORMAL LOW (ref 98–111)
Creatinine, Ser: 1.05 mg/dL (ref 0.61–1.24)
GFR, Estimated: >60 mL/min (ref >60)
Glucose, Bld: 125 mg/dL — ABNORMAL HIGH (ref 70–99)
Potassium: 3.7 mmol/L (ref 3.5–5.1)
Sodium: 135 mmol/L (ref 135–145)

## 2022-07-01 LAB — PHOSPHORUS
Phosphorus: 3.9 mg/dL (ref 2.5–4.6)
Phosphorus: 3.6 mg/dL (ref 2.5–4.6)

## 2022-07-01 LAB — MRSA NEXT GEN BY PCR, NASAL: MRSA by PCR Next Gen: NOT DETECTED

## 2022-07-01 LAB — MAGNESIUM
Magnesium: 2.4 mg/dL (ref 1.7–2.4)
Magnesium: 2.3 mg/dL (ref 1.7–2.4)
Magnesium: 2.1 mg/dL (ref 1.7–2.4)

## 2022-07-01 LAB — TROPONIN I (HIGH SENSITIVITY): Troponin I (High Sensitivity): 63 ng/L — ABNORMAL HIGH (ref <18)

## 2022-07-01 LAB — LACTIC ACID, PLASMA
Lactic Acid, Venous: 1.8 mmol/L (ref 0.5–1.9)
Lactic Acid, Venous: 1.7 mmol/L (ref 0.5–1.9)

## 2022-07-01 LAB — APTT: aPTT: 38 seconds — ABNORMAL HIGH (ref 24–36)

## 2022-07-01 LAB — FIBRINOGEN
Fibrinogen: 143 mg/dL — ABNORMAL LOW (ref 210–475)
Fibrinogen: 142 mg/dL — ABNORMAL LOW (ref 210–475)

## 2022-07-01 LAB — CULTURE, BLOOD (ROUTINE X 2): Special Requests: ADEQUATE

## 2022-07-01 LAB — HIV ANTIBODY (ROUTINE TESTING W REFLEX): HIV Screen 4th Generation wRfx: NONREACTIVE

## 2022-07-01 MED ORDER — PANTOPRAZOLE SODIUM 40 MG IV SOLR
40.0000 mg | INTRAVENOUS | Status: DC
  Administered 2022-07-01 – 2022-07-03 (×3): 40 mg via INTRAVENOUS
  Filled 2022-07-01 (×3): qty 10

## 2022-07-01 MED ORDER — AMIODARONE HCL IN DEXTROSE 360-4.14 MG/200ML-% IV SOLN
30.0000 mg/h | INTRAVENOUS | Status: AC
  Administered 2022-07-01: 30 mg/h via INTRAVENOUS
  Filled 2022-07-01: qty 200

## 2022-07-01 MED ORDER — HEPARIN (PORCINE) 25000 UT/250ML-% IV SOLN
2300.0000 [IU]/h | INTRAVENOUS | Status: DC
  Administered 2022-07-01: 1750 [IU]/h via INTRAVENOUS
  Administered 2022-07-01: 1500 [IU]/h via INTRAVENOUS
  Administered 2022-07-02: 2200 [IU]/h via INTRAVENOUS
  Administered 2022-07-02 – 2022-07-05 (×6): 2300 [IU]/h via INTRAVENOUS
  Filled 2022-07-01 (×9): qty 250

## 2022-07-01 MED ORDER — POLYETHYLENE GLYCOL 3350 17 G PO PACK: 17.0000 g | PACK | Freq: Every day | ORAL | Status: DC | PRN

## 2022-07-01 MED ORDER — FUROSEMIDE 10 MG/ML IJ SOLN
40.0000 mg | Freq: Once | INTRAMUSCULAR | Status: AC
  Administered 2022-07-01: 40 mg via INTRAVENOUS

## 2022-07-01 MED ORDER — SODIUM CHLORIDE 0.9 % IV SOLN
250.0000 mL | Freq: Once | INTRAVENOUS | Status: AC
  Administered 2022-07-01: 250 mL via INTRAVENOUS

## 2022-07-01 MED ORDER — INSULIN ASPART 100 UNIT/ML IJ SOLN
0.0000 [IU] | INTRAMUSCULAR | Status: DC
  Administered 2022-07-01: 3 [IU] via SUBCUTANEOUS
  Administered 2022-07-02: 5 [IU] via SUBCUTANEOUS
  Administered 2022-07-02 (×2): 3 [IU] via SUBCUTANEOUS
  Administered 2022-07-02: 2 [IU] via SUBCUTANEOUS
  Administered 2022-07-02: 3 [IU] via SUBCUTANEOUS
  Administered 2022-07-02: 2 [IU] via SUBCUTANEOUS
  Administered 2022-07-03: 3 [IU] via SUBCUTANEOUS
  Administered 2022-07-03: 2 [IU] via SUBCUTANEOUS
  Administered 2022-07-03: 3 [IU] via SUBCUTANEOUS
  Administered 2022-07-03: 5 [IU] via SUBCUTANEOUS
  Administered 2022-07-03 – 2022-07-04 (×4): 2 [IU] via SUBCUTANEOUS
  Administered 2022-07-04: 3 [IU] via SUBCUTANEOUS
  Administered 2022-07-04 – 2022-07-06 (×8): 2 [IU] via SUBCUTANEOUS
  Administered 2022-07-06: 3 [IU] via SUBCUTANEOUS
  Administered 2022-07-07: 2 [IU] via SUBCUTANEOUS

## 2022-07-01 MED ORDER — PERFLUTREN LIPID MICROSPHERE
1.0000 mL | INTRAVENOUS | Status: AC | PRN
  Administered 2022-07-01: 3 mL via INTRAVENOUS

## 2022-07-01 MED ORDER — FUROSEMIDE 10 MG/ML IJ SOLN
80.0000 mg | Freq: Two times a day (BID) | INTRAMUSCULAR | Status: DC
  Administered 2022-07-01 – 2022-07-03 (×5): 80 mg via INTRAVENOUS
  Filled 2022-07-01 (×6): qty 8

## 2022-07-01 MED ORDER — DOCUSATE SODIUM 100 MG PO CAPS: 100.0000 mg | ORAL_CAPSULE | Freq: Two times a day (BID) | ORAL | Status: DC | PRN

## 2022-07-01 MED ORDER — MILRINONE LACTATE IN DEXTROSE 20-5 MG/100ML-% IV SOLN
0.1250 ug/kg/min | INTRAVENOUS | Status: DC
  Administered 2022-07-01 – 2022-07-02 (×2): 0.125 ug/kg/min via INTRAVENOUS
  Administered 2022-07-03 (×2): 0.25 ug/kg/min via INTRAVENOUS
  Administered 2022-07-03: 0.125 ug/kg/min via INTRAVENOUS
  Administered 2022-07-04 – 2022-07-05 (×3): 0.25 ug/kg/min via INTRAVENOUS
  Administered 2022-07-05: 0.125 ug/kg/min via INTRAVENOUS
  Filled 2022-07-01 (×8): qty 100

## 2022-07-01 MED ORDER — DIGOXIN 125 MCG PO TABS
0.1250 mg | ORAL_TABLET | Freq: Every day | ORAL | Status: DC
  Administered 2022-07-01 – 2022-07-07 (×7): 0.125 mg via ORAL
  Filled 2022-07-01 (×7): qty 1

## 2022-07-01 MED ORDER — ALTEPLASE (PULMONARY EMBOLISM) INFUSION
100.0000 mg | Freq: Once | INTRAVENOUS | Status: AC
  Administered 2022-07-01: 100 mg via INTRAVENOUS
  Filled 2022-07-01: qty 100

## 2022-07-01 MED ORDER — POTASSIUM CHLORIDE CRYS ER 20 MEQ PO TBCR
60.0000 meq | EXTENDED_RELEASE_TABLET | Freq: Once | ORAL | Status: AC
  Administered 2022-07-01: 60 meq via ORAL
  Filled 2022-07-01: qty 3

## 2022-07-01 MED ORDER — FUROSEMIDE 10 MG/ML IJ SOLN
80.0000 mg | Freq: Two times a day (BID) | INTRAMUSCULAR | Status: DC
  Administered 2022-07-01: 80 mg via INTRAVENOUS
  Filled 2022-07-01: qty 8

## 2022-07-01 MED ORDER — AMIODARONE HCL IN DEXTROSE 360-4.14 MG/200ML-% IV SOLN
30.0000 mg/h | INTRAVENOUS | Status: DC
  Administered 2022-07-01 – 2022-07-03 (×6): 30 mg/h via INTRAVENOUS
  Filled 2022-07-01 (×5): qty 200

## 2022-07-01 MED ORDER — METOPROLOL TARTRATE 5 MG/5ML IV SOLN
2.5000 mg | Freq: Once | INTRAVENOUS | Status: DC
  Filled 2022-07-01: qty 5

## 2022-07-01 MED ORDER — CHLORHEXIDINE GLUCONATE CLOTH 2 % EX PADS
6.0000 | MEDICATED_PAD | Freq: Every day | CUTANEOUS | Status: DC
  Administered 2022-07-01 – 2022-07-06 (×7): 6 via TOPICAL

## 2022-07-01 MED ORDER — LACTATED RINGERS IV BOLUS
500.0000 mL | Freq: Once | INTRAVENOUS | Status: AC
  Administered 2022-07-01: 500 mL via INTRAVENOUS

## 2022-07-01 MED ORDER — TRAMADOL HCL 50 MG PO TABS
50.0000 mg | ORAL_TABLET | Freq: Four times a day (QID) | ORAL | Status: DC | PRN
  Administered 2022-07-02 (×2): 50 mg via ORAL
  Filled 2022-07-01 (×2): qty 1

## 2022-07-01 MED ORDER — FUROSEMIDE 10 MG/ML IJ SOLN
5.0000 mg/h | INTRAVENOUS | Status: DC
  Administered 2022-07-01: 5 mg/h via INTRAVENOUS
  Filled 2022-07-01: qty 20

## 2022-07-01 MED ORDER — AMIODARONE IV BOLUS ONLY 150 MG/100ML
150.0000 mg | Freq: Once | INTRAVENOUS | Status: AC
  Administered 2022-07-01: 150 mg via INTRAVENOUS

## 2022-07-01 MED ORDER — AMIODARONE HCL IN DEXTROSE 360-4.14 MG/200ML-% IV SOLN
INTRAVENOUS | Status: AC
  Filled 2022-07-01: qty 200

## 2022-07-01 MED ORDER — ONDANSETRON HCL 4 MG/2ML IJ SOLN
4.0000 mg | Freq: Once | INTRAMUSCULAR | Status: AC
  Administered 2022-07-01: 4 mg via INTRAVENOUS
  Filled 2022-07-01: qty 2

## 2022-07-01 NOTE — Progress Notes (Signed)
ANTICOAGULATION CONSULT NOTE - Follow Up Consult  Pharmacy Consult for heparin Indication: pulmonary embolus  No Known Allergies  Patient Measurements: Height: 6\' 2"  (188 cm) Weight: 124.8 kg (275 lb 2.2 oz) IBW/kg (Calculated) : 82.2 Heparin Dosing Weight: 110kg  Vital Signs: Temp: 98.5 F (36.9 C) (06/23 0200) Temp Source: Oral (06/23 0200) BP: 103/77 (06/23 0345) Pulse Rate: 99 (06/23 0345)  Labs: Recent Labs    06/30/22 1810 06/30/22 2010 07/01/22 0336 07/01/22 0337 07/01/22 0350  HGB 11.5*  --  11.5*  --  12.2*  HCT 35.9*  --  34.8*  --  36.0*  PLT 239  --  218  --   --   APTT  --   --  38*  --   --   LABPROT  --   --   --  23.1*  --   INR  --   --   --  2.0*  --   CREATININE 1.21  --   --   --   --   TROPONINIHS 72* 72*  --   --   --     Estimated Creatinine Clearance: 102.5 mL/min (by C-G formula based on SCr of 1.21 mg/dL).   Medications:  Medications Prior to Admission  Medication Sig Dispense Refill Last Dose   apixaban (ELIQUIS) 5 MG TABS tablet Take 1 tablet (5 mg total) by mouth 2 (two) times daily. 180 tablet 3 Past Month   Artificial Tear Ointment (DRY EYES OP) Place 2 drops into both eyes 2 (two) times daily as needed (dry eyes).      losartan (COZAAR) 25 MG tablet Take 1 tablet (25 mg total) by mouth daily at 10 pm. 30 tablet 0    metoprolol succinate (TOPROL XL) 50 MG 24 hr tablet Take 1 tablet (50 mg total) by mouth every morning. Hold if systolic blood pressure (top number) less than 100 mmHg or pulse less than 60 bpm. 90 tablet 0    sacubitril-valsartan (ENTRESTO) 49-51 MG Take 1 tablet by mouth 2 (two) times daily. 60 tablet 3    spironolactone (ALDACTONE) 25 MG tablet Take 1 tablet (25 mg total) by mouth in the morning. 90 tablet 0    torsemide (DEMADEX) 10 MG tablet Take 1 tablet (10 mg total) by mouth every morning. 30 tablet 0    Scheduled:   Chlorhexidine Gluconate Cloth  6 each Topical Daily    morphine injection  4 mg Intravenous Once    pantoprazole (PROTONIX) IV  40 mg Intravenous Q24H   Infusions:   sodium chloride 20 mL/hr at 07/01/22 0300   furosemide (LASIX) 200 mg in dextrose 5 % 100 mL (2 mg/mL) infusion 5 mg/hr (07/01/22 0332)   norepinephrine (LEVOPHED) Adult infusion 2 mcg/min (07/01/22 0300)    Assessment: 52yo male c/o SOB x2-3d, found to have acute PE in setting of acute decompensated biventricular HF with low anatomic clot burden but significant physiologic burden affecting hemodynamics >> initially started heparin which was stopped to give alteplase, now to resume heparin w/ PTT at 38 after alteplase administration.  Goal of Therapy:  Heparin level 0.3-0.5 units/ml until 6/24 0300 (23h after tPA) then 0.3-0.7 units/ml Monitor platelets by anticoagulation protocol: Yes   Plan:  Start heparin infusion at 1500 units/hr. Monitor heparin levels and CBC.  Vernard Gambles, PharmD, BCPS  07/01/2022,4:24 AM

## 2022-07-01 NOTE — Progress Notes (Addendum)
NAME:  Jerry Saunders, MRN:  [id number], DOB:  January 29, 1970, LOS: 0 ADMISSION DATE:  06/30/2022, CONSULTATION DATE:  06/30/22 REFERRING MD:  Lynelle Doctor - EM, CHIEF COMPLAINT:  SOB   History of Present Illness:  52 yo M PMH NICM EF <15% followed by [facility] and locally by [place] [facility], Hx DVT noncompliant w DOAC, LV thrombus, DM2, Severe pHTN mPAP 52 in June 2023 WHO group II, who presented to ED 6/22 with SOB and fatigue x a few weeks, some associated leg swelling. Worse sx 6/22 prompting ED presentation. In ED CXR suggestive of pulm edema, with hx of dvt DOAC noncompliace tachycardia- was sent for CTA chest which revealed RLL proximal segmental PE -- read as minimal clot burden and no evidence of R heart strain.  Received lasix in the ED. Became more tachycardic and hypotensive in ED and was started on levophed, prompting PCCM consultation . Also hypoxemic needing [place] o2 (refused BiPAP). Per cards Dr ToliaJune 2023 MRI Cardiology with severe low flow state LVEDV 444 mL and 27% EF for both LV and RV. LV very dilated and thus RV/LV ratio on CT will be inaccurate.  Ad[facility] 4/14-4/18/24 with decomp HFrEF + DVT   N.b.  - CTA also w 9mm pulmonary nodule on R -- rec CT chest in 3-21mo  - per Dr Odis Hollingshead - has missed many cardiology followup appointments  Pertinent  Medical History  NICM Biventricular systolic congestive heart failure Severe pHTN group II  DM2 Obesity  DVT    has a past medical history of Cardiomyopathy (HCC), CHF (congestive heart failure) (HCC), Diabetes mellitus without complication (HCC), DVT (deep venous thrombosis) (HCC), Hyperlipidemia, and Hypertension.   has a past surgical history that includes RIGHT/LEFT HEART CATH AND CORONARY ANGIOGRAPHY (N/A, 06/20/2021).   Significant Hospital Events: Including procedures, antibiotic start and stop dates in addition to other pertinent events   07/01/22 to ED w tachycardia, SOB. Found to have PE -- is small but possibly hemodynamically  significant when taking into account his severe underlying chronic dz -- severe pHTN, NICM EF <15%   . [facility]  Interim History / Subjective:  Patient went into A-fib with RVR, received amiodarone bolus and now on amiodarone infusion with heart rate in 90s now Continue to complain of shortness of breath as stated little bit better than last night but not completely resolved Remain afebrile Still on Levophed  Objective   Blood pressure 97/73, pulse (!) 155, temperature 98.4 F (36.9 C), temperature source Oral, resp. rate (!) 27, height 6\' 2"  (1.88 m), weight 124.8 kg, SpO2 99 %.        Intake/Output Summary (Last 24 hours) at 07/01/2022 4098 Last data filed at 07/01/2022 0700 Gross per 24 hour  Intake 1681.07 ml  Output 1100 ml  Net 581.07 ml   Filed Weights   06/30/22 1806 07/01/22 0200  Weight: 133.4 kg 124.8 kg    Examination: Physical exam: General: Acute on chronically ill-appearing male, lying on the bed HEENT: /AT, eyes anicteric.  moist mucus membranes Neuro: Alert, awake following commands Chest: Tachypneic, visibly short of breath, coarse breath sounds, no wheezes or rhonchi Heart: Regular rate and rhythm, no murmurs or gallops Abdomen: Soft, nontender, nondistended, bowel sounds present Skin: No rash  Labs and images were reviewed  Assessment & Plan:  Acute hypoxic respiratory failure Acute right lower lobe PE s/p systemic tPA History of LV thrombus History of DVT, noncompliance with anticoagulation Paroxysmal A-fib/flutter with  RVR Acute on chronic biventricular systolic CHF Demand cardiac ischemia Severe pulmonary hypertension Shock, multifactorial cardiogenic / obstructive Hyponatremia, resolved Anemia of chronic disease Obesity Diabetes type 2  Continue nasal cannula oxygen, titrate with O2 sat goal 92% S/p systemic tPA Continue IV heparin infusion Obtain echocardiogram and Doppler of lower extremities Went into A-fib with RVR  this morning, received amiodarone 150 x 1 Continue amiodarone infusion Converted to sinus rhythm Monitor intake and output Will place central line to get [facility] cardiology and advanced heart failure team is consulting Continue vasopressor support with Levophed with map goal 65 Closely monitor electrolytes Monitor H&H Monitor fingerstick goal 140-180 Counseling provided regarding diet and exercise  Best Practice (right click and "Reselect all SmartList Selections" daily)   Diet/type: NPO w/ oral meds, bedside swallow evaluation DVT prophylaxis: Systemic heparin GI prophylaxis: PPI Lines: N/A -  Foley:  N/A Code Status:  full code Last date of multidisciplinary goals of care discussion [07/01/22 - wife and patient updated   The patient is critically ill due to cardiogenic/obstructive shock/acute respiratory failure/acute PE.  Critical care was necessary to treat or prevent imminent or life-threatening deterioration.  Critical care was time spent personally by me on the following activities: development of treatment plan with patient and/or surrogate as well as nursing, discussions with consultants, evaluation of patient's response to treatment, examination of patient, obtaining history from patient or surrogate, ordering and performing treatments and interventions, ordering and review of laboratory studies, ordering and review of radiographic studies, pulse oximetry, re-evaluation of patient's condition and participation in multidisciplinary rounds.   During this encounter critical care time was devoted to patient care services described in this note for 44 minutes.     Cheri Fowler, MD [facility] See Amion for pager If no response to pager, please call [phone] until 7pm After 7pm, Please call E-link [phone]   LABS    PULMONARY Recent Labs  Lab 07/01/22 0350  PHART 7.419  PCO2ART 35.5  PO2ART 97  HCO3 23.0  TCO2 24  O2SAT 98     CBC Recent Labs  Lab 06/30/22 1810 07/01/22 0336 07/01/22 0350  HGB 11.5* 11.5* 12.2*  HCT 35.9* 34.8* 36.0*  WBC 9.4 9.9  --   PLT 239 218  --     COAGULATION Recent Labs  Lab 07/01/22 0337  INR 2.0*    CARDIAC  No results for input(s): "TROPONINI" in the last 168 hours. No results for input(s): "PROBNP" in the last 168 hours.   CHEMISTRY Recent Labs  Lab 06/30/22 1810 06/30/22 2010 07/01/22 0336 07/01/22 0337 07/01/22 0350 07/01/22 0702  NA 133*  --   --  132* 135 135  K 3.6  --   --  3.6 3.5 3.7  CL 98  --   --  99  --  97*  CO2 24  --   --  22  --  23  GLUCOSE 139*  --   --  145*  --  125*  BUN 31*  --   --  27*  --  26*  CREATININE 1.21  --   --  1.06  --  1.05  CALCIUM 8.0*  --   --  7.8*  --  7.8*  MG  --  2.4 2.3  --   --  2.1  PHOS  --   --  3.9  --   --  3.6   Estimated Creatinine Clearance: 118.1 mL/min (by C-G formula based on SCr  of 1.05 mg/dL).   LIVER Recent Labs  Lab 07/01/22 0337  AST 192*  ALT 448*  ALKPHOS 79  BILITOT 3.1*  PROT 6.6  ALBUMIN 3.0*  INR 2.0*     INFECTIOUS Recent Labs  Lab 06/30/22 1811 07/01/22 0336 07/01/22 0702  LATICACIDVEN 1.8 1.8 1.7     ENDOCRINE CBG (last 3)  Recent Labs    07/01/22 0350  GLUCAP 141*         IMAGING x48h  - image(s) personally visualized  -   highlighted in bold CT Angio Chest PE W and/or Wo Contrast  Result Date: 06/30/2022 CLINICAL DATA:  Cough, short of breath, febrile, suspected pulmonary embolus EXAM: CT ANGIOGRAPHY CHEST WITH CONTRAST TECHNIQUE: Multidetector CT imaging of the chest was performed using the standard protocol during bolus administration of intravenous contrast. Multiplanar CT image reconstructions and MIPs were obtained to evaluate the vascular anatomy. RADIATION DOSE REDUCTION: This exam was performed according to the departmental dose-optimization program which includes automated exposure control, adjustment of the mA and/or kV according to  patient size and/or use of iterative reconstruction technique. CONTRAST:  75mL OMNIPAQUE IOHEXOL 350 MG/ML SOLN COMPARISON:  06/30/2022, 06/18/2021 FINDINGS: Cardiovascular: This is a technically adequate evaluation of the pulmonary vasculature. There is a proximal segmental pulmonary embolus within the right lower lobe. Minimal clot burden. No evidence of right heart strain, with chronic dilatation of the cardiac chambers again noted. Continued cardiomegaly without pericardial effusion. Normal caliber of the thoracic aorta. Mediastinum/Nodes: No enlarged mediastinal, hilar, or axillary lymph nodes. Thyroid gland, trachea, and esophagus demonstrate no significant findings. Lungs/Pleura: No acute airspace disease, effusion, or pneumothorax. Scattered hypoventilatory changes are seen at the lung bases. Sub solid 9 mm subpleural right lower lobe pulmonary nodule reference image 100/7 is noted, new since prior exam. Upper Abdomen: No acute abnormality. Musculoskeletal: No acute or destructive bony abnormalities. Reconstructed images demonstrate no additional findings. Review of the MIP images confirms the above findings. IMPRESSION: 1. Right lower lobe proximal segmental pulmonary embolus. Minimal clot burden with no evidence of right heart strain. 2. Stable cardiomegaly, with marked dilatation of the cardiac chambers again noted. No pericardial effusion. 3. Right part-solid pulmonary nodule measuring 9 mm. Per Fleischner Society Guidelines, recommend a non-contrast Chest CT at 3-6 months to confirm persistence. If unchanged and the solid component remains < 6 mm, an annual non-contrast Chest CT should be performed for 5 years. If the solid component is 6-8 mm on follow-up, recommend biopsy or resection. If the solid component is > 8 mm on follow-up, recommend PET/CT, biopsy or resection. These guidelines do not apply to immunocompromised patients and patients with cancer. Follow up in patients with significant  comorbidities as clinically warranted. For lung cancer screening, adhere to Lung-RADS guidelines. Reference: Radiology. 2017; 284(1):228-43. Critical Value/emergent results were called by telephone at the time of interpretation on 06/30/2022 at 10:30 pm to provider [facility] , who verbally acknowledged these results. Electronically Signed   By: Sharlet Salina M.D.   On: 06/30/2022 22:31   DG Chest 2 View  Result Date: 06/30/2022 CLINICAL DATA:  Elevated temperature, cough and shortness of breath. EXAM: CHEST - 2 VIEW COMPARISON:  Chest radiograph dated June 18, 2021 FINDINGS: The heart is enlarged. Pulmonary vascular congestion and bilateral perihilar interstitial opacities suggesting mild pulmonary edema. No appreciable pleural effusion. IMPRESSION: Cardiomegaly with pulmonary vascular congestion and mild pulmonary edema. Electronically Signed   By: Larose Hires D.O.   On: 06/30/2022 18:50

## 2022-07-01 NOTE — Progress Notes (Signed)
   07/01/22 2255  BiPAP/CPAP/SIPAP  BiPAP/CPAP/SIPAP Pt Type Adult  Reason BIPAP/CPAP not in use Non-compliant  FiO2 (%) 55 %  BiPAP/CPAP /SiPAP Vitals  Pulse Rate 87  Resp 18  SpO2 99 %  MEWS Score/Color  MEWS Score 1  MEWS Score Color Green   RT called to beside patient desat while sleeping. RT offered to place patient on BIPAP/CPAP. Patient stated he can not tolerate because he feels smothered. RT offered to place nasal mask patient declined. RT placed patient on Venti Mask at this time.

## 2022-07-01 NOTE — Progress Notes (Addendum)
ANTICOAGULATION CONSULT NOTE - Follow Up Consult  Pharmacy Consult for heparin Indication: pulmonary embolus  No Known Allergies  Patient Measurements: Height: 6\' 2"  (188 cm) Weight: 124.8 kg (275 lb 2.2 oz) IBW/kg (Calculated) : 82.2 Heparin Dosing Weight: 110kg  Vital Signs: Temp: 98.4 F (36.9 C) (06/23 0400) Temp Source: Oral (06/23 0400) BP: 86/74 (06/23 0615) Pulse Rate: 155 (06/23 0615)  Labs: Recent Labs    06/30/22 1810 06/30/22 2010 07/01/22 0336 07/01/22 0337 07/01/22 0350  HGB 11.5*  --  11.5*  --  12.2*  HCT 35.9*  --  34.8*  --  36.0*  PLT 239  --  218  --   --   APTT  --   --  38*  --   --   LABPROT  --   --   --  23.1*  --   INR  --   --   --  2.0*  --   CREATININE 1.21  --   --  1.06  --   TROPONINIHS 72* 72*  --   --   --      Estimated Creatinine Clearance: 117 mL/min (by C-G formula based on SCr of 1.06 mg/dL).   Medications:  Medications Prior to Admission  Medication Sig Dispense Refill Last Dose   apixaban (ELIQUIS) 5 MG TABS tablet Take 1 tablet (5 mg total) by mouth 2 (two) times daily. 180 tablet 3 Past Month   Artificial Tear Ointment (DRY EYES OP) Place 2 drops into both eyes 2 (two) times daily as needed (dry eyes).      losartan (COZAAR) 25 MG tablet Take 1 tablet (25 mg total) by mouth daily at 10 pm. 30 tablet 0    metoprolol succinate (TOPROL XL) 50 MG 24 hr tablet Take 1 tablet (50 mg total) by mouth every morning. Hold if systolic blood pressure (top number) less than 100 mmHg or pulse less than 60 bpm. 90 tablet 0    sacubitril-valsartan (ENTRESTO) 49-51 MG Take 1 tablet by mouth 2 (two) times daily. 60 tablet 3    spironolactone (ALDACTONE) 25 MG tablet Take 1 tablet (25 mg total) by mouth in the morning. 90 tablet 0    torsemide (DEMADEX) 10 MG tablet Take 1 tablet (10 mg total) by mouth every morning. 30 tablet 0    Scheduled:   Chlorhexidine Gluconate Cloth  6 each Topical Daily   metoprolol tartrate  2.5 mg Intravenous  Once    morphine injection  4 mg Intravenous Once   pantoprazole (PROTONIX) IV  40 mg Intravenous Q24H   Infusions:   sodium chloride 20 mL/hr at 07/01/22 0600   amiodarone     heparin 1,500 Units/hr (07/01/22 0600)   norepinephrine (LEVOPHED) Adult infusion Stopped (07/01/22 0444)    Assessment: 52yo male with history of DVT found to have acute PE and large LV thrombus in setting of acute decompensated biventricular HF. Clot burden was low,  however TPA was administered due to hemodynamic compromise. Heparin was resumed 6/23 AM. Patient prescribed Eliquis PTA for DVT, but was not taking medications.   Heparin level on 1500 units/hr is undetectable. No bleeding or infusion issues per RN. Lactic acid 1.7 this AM. No fibrinogens ordered. Will order now and again with next level. CCM planning to place central line. Will time heparin level for 6 hours after resuming.  Goal of Therapy:  Heparin level 0.3-0.5 units/ml until 6/24 0300 (~24h after tPA) then 0.3-0.7 units/ml Monitor platelets by anticoagulation protocol:  Yes   Plan:  Increase heparin infusion to 1750 units/hr. Will need to monitor  Heparin level 6 hours after central line placement (~2000) Fibrinogen now and in 6 hours Monitor daily heparin level and CBC   Thank you for involving pharmacy in this patient's care.  Enos Fling, PharmD PGY2 Pharmacy Resident 07/01/2022 12:38 PM

## 2022-07-01 NOTE — Progress Notes (Signed)
ON-CALL CARDIOLOGY 07/01/22  Patient's name: Jerry Saunders.   MRN: [id number].    DOB: June 16, 1970  Interaction regarding this patient's care today: Called by ICU Dr. Valora Piccolo regarding him going into Sinus tachycardia, hypotensive, and asymptomatic.   Evaluated the patient at bedside   No chest pain. Baseline dyspnea.   tPA completed around 330am per RN. Now in IV Heparin.   EKG illustrate Atrial flutter.  Telemetry: Sinus tach until around 6am went into NSVT and it transitioned to AFL.    PHYSICAL EXAM: Temp:  [98.4 F (36.9 C)-102.1 F (38.9 C)] 98.4 F (36.9 C) (06/23 0400) Pulse Rate:  [43-155] 155 (06/23 0615) Cardiac Rhythm: Sinus tachycardia (06/23 0400) Resp:  [11-40] 22 (06/23 0615) BP: (82-117)/(47-96) 86/74 (06/23 0615) SpO2:  [86 %-100 %] 99 % (06/23 0615) Weight:  [124.8 kg-133.4 kg] 124.8 kg (06/23 0200)     07/01/2022    6:15 AM 07/01/2022    6:00 AM 07/01/2022    5:45 AM  Vitals with BMI  Systolic 86 106 103  Diastolic 74 86 88  Pulse 155 103 109    CONSTITUTIONAL: Older than stated age, mild distress,  SKIN: Skin is warm and dry. No rash noted. No cyanosis. No pallor. No jaundice HEAD: Normocephalic and atraumatic.  EYES: No scleral icterus MOUTH/THROAT: Moist oral membranes.  NECK: JVD present. No thyromegaly noted. No carotid bruits  CHEST Normal respiratory effort. No intercostal retractions  LUNGS: Decreased breath sounds at the bases, bibasilar crackles, no wheezing.  CARDIOVASCULAR: Tachycardic, regular, S1-S2, no murmurs rubs or gallops appreciated secondary to tachycardia ABDOMINAL: Obese, soft, nontender, nondistended, positive bowel sounds in all 4 quadrants no apparent ascites.  EXTREMITIES: Edema bilaterally, cool to touch, no peripheral edema  HEMATOLOGIC: No significant bruising NEUROLOGIC: Oriented to person, place, and time. Nonfocal. Normal muscle tone.  PSYCHIATRIC: Normal mood and affect. Normal behavior.  Cooperative   Impression: Acute decompensated biventricular failure, stage C, NYHA class III Acute PE.  New onset of AFL   Recommendations: Given his complex history and clinical trajectory.  Called  Dr. Gala Romney and reviewed the case.  I placed def pad anterior and posterior.  Started low dose Levo Amio bolus 150mg  over 20 minutes.  He transitioned to Sinus Tachycardia, MAP , HR 102-108.  CRITICAL CARE Performed by: Tessa Lerner, DO, [facility]   Total critical care time: 35 minutes   Critical care time was exclusive of separately billable procedures and treating other patients.   Critical care was necessary to treat or prevent imminent or life-threatening deterioration due to cardiac rhythm disturbance in the setting of acute heart failure and acute PE.   Critical care was time spent personally by me on the following activities: development of treatment plan with patient and/or surrogate as well as nursing, discussions with consultants, evaluation of patient's response to treatment, examination of patient, obtaining history from patient or surrogate, ordering and performing treatments and interventions, ordering and review of laboratory studies, ordering and review of radiographic studies, pulse oximetry and re-evaluation of patient's condition.  Tessa Lerner, [place], [facility]  Pager:  [phone] Office: [phone]

## 2022-07-01 NOTE — Consult Note (Signed)
CARDIOLOGY CONSULT NOTE  Patient ID: Jerry Saunders MRN: [id number] DOB/AGE: 1970/07/26 52 y.o.  Admit date: 06/30/2022 Attending physician: Kalman Shan, MD Primary Physician:  [facility] Outpatient Cardiology Provider: Tessa Lerner, DO, [facility] Inpatient Cardiologist: Tessa Lerner, DO, [facility]  Reason of consultation: Nonischemic cardiomyopathy biventricular heart failure, acute PE Referring physician: Dr. Iantha Fallen  Chief complaint: Shortness of breath  HPI:  Jerry Saunders is a 52 y.o. 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  Patient presents to the hospital with a chief complaint of shortness of breath ongoing for the last 3 weeks but now progressive.  Workup in the ER notes newly discovered right lower lobe proximal segmental pulmonary embolism, minimal clot burden with no evidence of right heart strain.  Given his significant cardiovascular history and newly discovered PE with hemodynamic compromise cardiology was asked to see the patient critical care medicine/emergency medicine.  Time of evaluation patient was in ER bed 23, sitting upright, nasal cannula oxygen, SBP around 100 mmHg on Levophed, 2 L nasal cannula oxygen (refused BiPAP).  Able to converse in full sentences but audible dyspnea.  Patient states that over the last 3 weeks has been getting progressively short of breath with minimal activity.  He stopped his heart failure medications approximately 1 month ago due to running out of prescriptions.  He has bilateral lower extremity swelling, orthopnea, PND.  Approximately 2 days ago had a brief syncopal event while walking to the kitchen.   Since he regained consciousness quickly did not seek medical attention.  No family at bedside.  ALLERGIES: No Known Allergies  PAST MEDICAL HISTORY: Past Medical History:  Diagnosis Date   Cardiomyopathy (HCC)    CHF (congestive heart failure) (HCC)    Diabetes mellitus without complication (HCC)    DVT (deep venous thrombosis) (HCC)    Hyperlipidemia    Hypertension     PAST SURGICAL HISTORY: Past Surgical History:  Procedure Laterality Date   RIGHT/LEFT HEART CATH AND CORONARY ANGIOGRAPHY N/A 06/20/2021   Procedure: RIGHT/LEFT HEART CATH AND CORONARY ANGIOGRAPHY;  Surgeon: Elder Negus, MD;  Location: [facility] INVASIVE CV LAB;  Service: Cardiovascular;  Laterality: N/A;    FAMILY HISTORY: The patient's family history is not on file.   SOCIAL HISTORY:  The patient  reports that he has never smoked. He does not have any smokeless tobacco history on file. He reports that he does not drink alcohol and does not use drugs.  MEDICATIONS: Current Outpatient Medications  Medication Instructions   apixaban (ELIQUIS) 5 mg, Oral, 2 times daily   Artificial Tear Ointment (DRY EYES OP) 2 drops, Both Eyes, 2 times daily PRN   losartan (COZAAR) 25 mg, Oral, Daily at 10 pm   metoprolol succinate (TOPROL XL) 50 mg, Oral, Every morning, Hold if systolic blood pressure (top number) less than 100 mmHg or pulse less than 60 bpm.   sacubitril-valsartan (ENTRESTO) 49-51 MG 1 tablet, Oral, 2 times daily   spironolactone (ALDACTONE) 25 mg, Oral, Every morning   torsemide (DEMADEX) 10 mg, Oral, Every morning    REVIEW OF SYSTEMS: Review of Systems  Constitutional: Positive for malaise/fatigue and weight gain.       Appears older than stated age  Cardiovascular:  Positive for dyspnea  on exertion, leg swelling, orthopnea, paroxysmal nocturnal dyspnea and syncope. Negative for chest pain, claudication, irregular heartbeat, near-syncope and palpitations.  Respiratory:  Positive for shortness of  breath.   Hematologic/Lymphatic: Negative for bleeding problem.  Musculoskeletal:  Negative for muscle cramps and myalgias.  Neurological:  Positive for dizziness and light-headedness.    PHYSICAL EXAMINATION: PHYSICAL EXAM: Temp:  [99.1 F (37.3 C)-102.1 F (38.9 C)] 99.1 F (37.3 C) (06/22 2212) Pulse Rate:  [44-146] 97 (06/23 0115) Resp:  [11-40] 25 (06/23 0115) BP: (82-117)/(47-96) 100/73 (06/23 0115) SpO2:  [92 %-100 %] 100 % (06/23 0115) Weight:  [133.4 kg] 133.4 kg (06/22 1806)  Intake/Output:  Intake/Output Summary (Last 24 hours) at 07/01/2022 0149 Last data filed at 06/30/2022 2212 Gross per 24 hour  Intake 500.74 ml  Output --  Net 500.74 ml     Net IO Since Admission: 500.74 mL [07/01/22 0149]  Weights:     06/30/2022    6:06 PM 01/18/2022    3:15 PM 08/21/2021    3:49 PM  Last 3 Weights  Weight (lbs) 294 lb 1.5 oz 294 lb 279 lb  Weight (kg) 133.4 kg 133.358 kg 126.554 kg     Physical Exam  Constitutional: No distress. He appears acutely ill.  Appears older than stated age, on nasal cannula oxygen,  HENT:  Dry mucous membranes  Neck: JVD present.  Cardiovascular: Regular rhythm, S1 normal and S2 normal. Tachycardia present. Exam reveals no gallop, no S3 and no S4.  No murmur heard. Pulmonary/Chest: No stridor. He has no wheezes. Mild rales at bases  Decreased breath sounds bilaterally.  Abdominal: Soft. Bowel sounds are normal. He exhibits no distension. There is no abdominal tenderness.  Obesity  Musculoskeletal:        General: Edema present.     Cervical back: Neck supple.     Comments: Cool to touch bilaterally.  Neurological: He is alert and oriented to person, place, and time. He has intact cranial nerves (2-12).  Skin: Skin is warm and moist.    LAB RESULTS: Chemistry Recent Labs  Lab 06/30/22 1810  NA 133*  K 3.6  CL 98  CO2 24  GLUCOSE 139*  BUN 31*  CREATININE 1.21  CALCIUM 8.0*  GFRNONAA >60  ANIONGAP 11     Hematology Recent Labs  Lab 06/30/22 1810  WBC 9.4  RBC 4.20*  HGB 11.5*  HCT 35.9*  MCV 85.5  MCH 27.4  MCHC 32.0  RDW 15.1  PLT 239   High Sensitivity Troponin:   Recent Labs  Lab 06/30/22 1810 06/30/22 2010  TROPONINIHS 72* 72*     Cardiac EnzymesNo results for input(s): "TROPONINI" in the last 168 hours. No results for input(s): "TROPIPOC" in the last 168 hours.  BNP Recent Labs  Lab 06/30/22 1810  BNP 2,206.2*    DDimer No results for input(s): "DDIMER" in the last 168 hours.  Hemoglobin A1c:  Lab Results  Component Value Date   HGBA1C 7.0 (H) 01/19/2022   MPG 177.16 06/19/2021   TSH No results for input(s): "TSH" in the last 8760 hours. Lipid Panel  Lab Results  Component Value Date   CHOL 116 01/19/2022   HDL 44 01/19/2022   LDLCALC 58 01/19/2022   LDLDIRECT 60 01/19/2022   TRIG 64 01/19/2022   CHOLHDL 3.5 06/21/2021   Drugs of Abuse     Component Value Date/Time   LABOPIA NONE DETECTED 06/20/2021 1835   COCAINSCRNUR NONE DETECTED 06/20/2021 1835   LABBENZ NONE  DETECTED 06/20/2021 1835   AMPHETMU NONE DETECTED 06/20/2021 1835   THCU NONE DETECTED 06/20/2021 1835   LABBARB NONE DETECTED 06/20/2021 1835      CARDIAC DATABASE: EKG: 06/30/2022:  @17 :59 Sinus tachycardia, 117 bpm, biatrial enlargement, right axis, RVH. @2013 : Sinus tachycardia, questionable lead reversal. @2229 : Sinus rhythm, 96 bpm, ventricular bigeminy, biatrial enlargement, RVH.  Echocardiogram: 06/19/2021: LVEF <20%, severely reduced systolic function, global hypokinesis, left ventricular size is dilated, right ventricular size is dilated, systolic function reduced, biatrial enlargement, mild MR, IVC dilated, estimated RAP 15 mmHg, Definity contrast was utilized LV thrombus not present (This is a personal read, it differs from the final report).   Stress Testing:  None   Heart Catheterization: Right dominant circulation, normal coronaries No angiographic evidence of  coronary artery disease   RA: 16 mmHg RV: 69/11 mmHg, RVEDP 24 mmHg PA: 67/43 mmHg, mPAP 54 mmHg PCW: 27 mmHg   CO: 5.8 L/min CI: 2.3 L/min/m2   Patient went into SVT 3 times with any minimal irritation of LV during left heart catheterization, requiring vagal maneuvers, adenosine 6 mg, 12 mg, with conversion to sinus rhythm   Conclusion: Decompensated nonischemic cardiomyopathy Consider dilated or arrhythmia induced cardiomyopathy  Severe pulmonary hypertension, WHO grp II   GDMT for HFrEF with continued workup for etiology evaluation  Cardiac MRI June 22, 2021: 1.  Severe LV dilatation with severe systolic dysfunction (EF 20%) LV EDV: (Normal 77-195 mL) LV ESV: (Normal 19-72 mL)   2.  Moderate RV dilatation with severe systolic dysfunction (EF 27%) RV EDV: (Normal 88-227 mL) RV ESV: (Normal 23-103 mL)   3. RV insertion site late gadolinium enhancement, which is a nonspecific finding often seen in setting of elevated pulmonary pressures. No other LGE seen  RADIOLOGY: Chest x-ray June 30, 2022: Cardiomegaly with pulmonary vascular congestion and mild pulmonary edema.  CT PE protocol June 30, 2022: 1. Right lower lobe proximal segmental pulmonary embolus. Minimal clot burden with no evidence of right heart strain. 2. Stable cardiomegaly, with marked dilatation of the cardiac chambers again noted. No pericardial effusion. 3. Right part-solid pulmonary nodule measuring 9 mm. Per Fleischner Society Guidelines, recommend a non-contrast Chest CT at 3-6 months to confirm persistence. If unchanged and the solid component remains < 6 mm, an annual non-contrast Chest CT should be performed for 5 years. If the solid component is 6-8 mm on follow-up, recommend biopsy or resection. If the solid component is > 8 mm on follow-up, recommend PET/CT, biopsy or resection. These guidelines do not apply to immunocompromised patients and patients with cancer. Follow up in  patients with significant co morbidities as clinically warranted. For lung cancer screening, adhere to Lung-RADS guidelines. Reference: Radiology. 2017; 284(1):228-43.   Scheduled Meds:  Chlorhexidine Gluconate Cloth  6 each Topical Daily   furosemide  40 mg Intravenous Once    morphine injection  4 mg Intravenous Once   pantoprazole (PROTONIX) IV  40 mg Intravenous Q24H    Continuous Infusions:  sodium chloride 250 mL (06/30/22 2335)   sodium chloride     alteplase 100 mg (07/01/22 0114)   norepinephrine (LEVOPHED) Adult infusion 3 mcg/min (06/30/22 2349)    PRN Meds: docusate sodium, polyethylene glycol  IMPRESSION & RECOMMENDATIONS: Rion Catala is a 52 y.o. 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  Impression:  Acute decompensated biventricular heart failure, stage C, NYHA class IIIb Acute pulmonary embolism Acute hypoxic respiratory failure Circulatory shock -cardiogenic (History of NICMP & severe pulmonary hypertension likely due to [facility] group 2 )versus obstructive (Acute PE) Acute febrile illness -present on arrival Severe pulmonary hypertension, WHO group 2 Syncope Elevated troponins likely secondary to supply demand ischemia Diabetes mellitus type 2 Ventricular ectopic burden History of DVT Obesity Noncompliance  Plan:  Acute decompensated biventricular heart failure, stage C, NYHA class IIIb Initially diagnosed in June 2023.  For Background: During prior hospitalization Life Vest was recommended for the first 90 days given his young age, severely reduced/dilated biventricular heart failure, ventricular ectopy noted on telemetry. But he and wife refused.  Shared decision was to  follow-up closely with uptitration of GDMT and have a repeat echo in 90 days to see if he was a candidate for AICD.  However, he has illustrated noncompliance with follow-up as well as escalating medical therapy (personal reasons as well as lack of insurance). Last office visit Jan 2024 w/ recommended follow up in 3 weeks.   Discharge weight in June 2023 123kg.   He has been out of his medications for at least 1 month prior to this admission. Symptoms consistent with NYHA class IIIb, pulmonary vascular congestion, BNP elevation, hypertension, cool extremities.  On arrival: Lactic acid 1.8, elevated/flat troponins, BNP 2206, hyponatremia, hypotensive, tachycardia, tachypnea, hypoxic.  Will check AST ALT.  Recommended starting a central line prior to tPA infusion to check hemodynamics/mixed venous; however, critical care recommended to defer this 24 to 48 hours after tPA infusion.  Currently on low-dose Levophed.   May need either dobutamine or milrinone infusion depending on if he is having ventricular ectopy/IV access/renal function.  Agree with CCM with regards to advanced heart failure consultation given his biventricular failure/severe pulm hypertension/acute PE.  Acute pulmonary embolism Acute hypoxic respiratory failure Circulatory shock -cardiogenic (History of NICMP & severe pulmonary hypertension likely due to WHO group 2 )versus obstructive (Acute PE) Syncope CCM reached out for stat echo / consult and role of lytic therapy.  Per radiology, PE is involving the proximal right lower lobe (minimal clot burden and no evidence of right heart strain).  However, given that his biventricular chambers are severely dilated at baseline it is my medical opinion that a STAT echo to evaluate for RV LV ratio may not be an accurate assessment of RV strain. In fact he is already hypotensive, cardiac biomarker (BNP significantly elevated, troponins elevated but flat), hypoxic need oxygen  supplementation / BiPAP support, dynamic EKG changes, recently had syncope 2 days ago, baseline pulmonary hypertension secondary due to [facility] 2 his baseline reserve is quite low. Though the clot burden is low it is greatly impacting his hemodynamic.  This was discussed at length with Dr. Marchelle Gearing and shared decision was to offer lytic therapy to the patient.  Dr. Marchelle Gearing spoke to  both patient and wife reviewed the risks, benefits, alternatives to tPA and they chose to proceed forward..  Decision was to initiate lytic therapy in the hopes of improving his hemodynamic. Agree with bilateral venous duplex to evaluate for DVT. This is a second thromboembolic event -he needs age-appropriate cancer screening as outpatient.  Elevated troponins secondary to supply demand ischemia: recently had R&LHC obstructive disease is of low probability.   Ventricular ectopy: Chronic  Asymptomatic.  Noted on telemetry. Will hold off on amiodarone/nodal blocking agents given his acute biventricular HF and  PE.  Monitor for now.   Non-compliance:  Noncompliance stems from both personal choices as well as resource limitations He has ran out of his heart failure medications multiple times (without call his providers for refills) At times fails to follow a low-salt diet / fluid restrictions Outpatient sleep medicine evaluation is still pending.  Escalation of therapy has been difficult due to lack of insurance - I have worked with him to get on as many generic as possible and apply of patient assistance.  I have offered to see him as outpatient even if didn't have insurance.    Patient states that he would like to be full code.  He understands that he is critically ill based on his chronic comorbid conditions, acute pulmonary embolism, and hemodynamics. Patient's questions and concerns were addressed to his satisfaction. He voices understanding of the instructions provided during this encounter.   CRITICAL  CARE Performed by: Tessa Lerner, DO, [facility]   Total critical care time: 57 minutes   Critical care time was exclusive of separately billable procedures and treating other patients.   Critical care was necessary to treat or prevent imminent or life-threatening deterioration due to acute decompensated biventricular heart failure and acute pulmonary embolism.  Discussion with the ED physician Dr. Jarold Song, critical care medicine Dr. Marchelle Gearing, prolonged discussion with the patient.   Critical care was time spent personally by me on the following activities: development of treatment plan with patient and/or surrogate as well as nursing, discussions with consultants, evaluation of patient's response to treatment, examination of patient, obtaining history from patient or surrogate, ordering and performing treatments and interventions, ordering and review of laboratory studies, ordering and review of radiographic studies, pulse oximetry and re-evaluation of patient's condition.  This note was created using a voice recognition software as a result there may be grammatical errors inadvertently enclosed that do not reflect the nature of this encounter. Every attempt is made to correct such errors.  Delilah Shan [facility]  Pager:  [phone] Office: [phone] 07/01/2022, 1:49 AM

## 2022-07-01 NOTE — Procedures (Addendum)
Central Venous Catheter Insertion Procedure Note  Jerry Saunders  [id number]  [birth year]  Date:07/01/22  Time:2:02 PM   Provider Performing:Nakea Gouger   Procedure: Insertion of Non-tunneled Central Venous [phone]) with US guidance ([id number])   Indication(s) Medication administration  Consent Risks of the procedure as well as the alternatives and risks of each were explained to the patient and/or caregiver.  Consent for the procedure was obtained and is signed in the bedside chart  Anesthesia Topical only with 1% lidocaine   Timeout Verified patient identification, verified procedure, site/side was marked, verified correct patient position, special equipment/implants available, medications/allergies/relevant history reviewed, required imaging and test results available.  Sterile Technique Maximal sterile technique including full sterile barrier drape, hand hygiene, sterile gown, sterile gloves, mask, hair covering, sterile ultrasound probe cover (if used).  Procedure Description Area of catheter insertion was cleaned with chlorhexidine and draped in sterile fashion.  With real-time ultrasound guidance a central venous catheter was placed into the left subclavian vein. Nonpulsatile blood flow and easy flushing noted in all ports.  The catheter was sutured in place and sterile dressing applied.  Complications/Tolerance None; patient tolerated the procedure well. Chest X-ray is ordered to verify placement for internal jugular or subclavian cannulation.   Chest x-ray is not ordered for femoral cannulation.  EBL Minimal  Specimen(s) None

## 2022-07-01 NOTE — Progress Notes (Addendum)
eLink Physician-Brief Progress Note Patient Name: Jerry Saunders DOB: 08/10/1970 MRN: [id number]   Date of Service  07/01/2022  HPI/Events of Note  Notified of SVT with HR in the 150s.   EKG reveals sinus tachycardia.  Pt is off pressors. Pt is awake and alert, denies chest pain or palpitations. Pt has put out 1L since Lasix was given.  BP 86/74, HR 155, RR 30s, O2 sats 96% on nasal cannula.  eICU Interventions  Hold lasix.   Give 500cc LR bolus.  Recheck BMP, Mg, phos.  Will likely need beta blocker and neosynephrine for BP support.        Larinda Buttery 07/01/2022, 6:19 AM  6:35 AM Spoke with Dr. Odis Hollingshead from cardiology who will come to reevaluate the patient.

## 2022-07-01 NOTE — Progress Notes (Signed)
Attempted to place patient on BIPAP and he did not tolerate it well. Pt stated he felt like he is suffocating and cannot do it. Ramaswamy at beside when I went back to check on him momonets later and states that he is okay off BIPAP. Will cont to monitor.  Nelda Marseille

## 2022-07-01 NOTE — ED Notes (Signed)
Pt refuses Bipap. Pt states "I feel like I'm suffocating with that mask on". Lynelle Doctor, MD made aware.

## 2022-07-01 NOTE — Progress Notes (Signed)
ANTICOAGULATION CONSULT NOTE - Follow Up Consult  Pharmacy Consult for heparin Indication: pulmonary embolus/DVT/LV thrombus  No Known Allergies  Patient Measurements: Height: 6\' 2"  (188 cm) Weight: 124.8 kg (275 lb 2.2 oz) IBW/kg (Calculated) : 82.2 Heparin Dosing Weight: 110kg  Vital Signs: Temp: 98.8 F (37.1 C) (06/23 2000) Temp Source: Oral (06/23 2000) BP: 98/77 (06/23 2130) Pulse Rate: 88 (06/23 2130)  Labs: Recent Labs    06/30/22 1810 06/30/22 2010 07/01/22 0336 07/01/22 0337 07/01/22 0350 07/01/22 0702 07/01/22 1127 07/01/22 2114  HGB 11.5*  --  11.5*  --  12.2*  --   --   --   HCT 35.9*  --  34.8*  --  36.0*  --   --   --   PLT 239  --  218  --   --   --   --   --   APTT  --   --  38*  --   --   --   --   --   LABPROT  --   --   --  23.1*  --   --   --   --   INR  --   --   --  2.0*  --   --   --   --   HEPARINUNFRC  --   --   --   --   --   --  <0.10* 0.19*  CREATININE 1.21  --   --  1.06  --  1.05  --   --   TROPONINIHS 72* 72*  --   --   --  63*  --   --      Estimated Creatinine Clearance: 118.1 mL/min (by C-G formula based on SCr of 1.05 mg/dL).   Medications:  Medications Prior to Admission  Medication Sig Dispense Refill Last Dose   apixaban (ELIQUIS) 5 MG TABS tablet Take 1 tablet (5 mg total) by mouth 2 (two) times daily. 180 tablet 3 Past Month   Artificial Tear Ointment (DRY EYES OP) Place 2 drops into both eyes 2 (two) times daily as needed (dry eyes).      losartan (COZAAR) 25 MG tablet Take 1 tablet (25 mg total) by mouth daily at 10 pm. 30 tablet 0    metoprolol succinate (TOPROL XL) 50 MG 24 hr tablet Take 1 tablet (50 mg total) by mouth every morning. Hold if systolic blood pressure (top number) less than 100 mmHg or pulse less than 60 bpm. 90 tablet 0    sacubitril-valsartan (ENTRESTO) 49-51 MG Take 1 tablet by mouth 2 (two) times daily. 60 tablet 3    spironolactone (ALDACTONE) 25 MG tablet Take 1 tablet (25 mg total) by mouth in the  morning. 90 tablet 0    torsemide (DEMADEX) 10 MG tablet Take 1 tablet (10 mg total) by mouth every morning. 30 tablet 0    Scheduled:   Chlorhexidine Gluconate Cloth  6 each Topical Daily   digoxin  0.125 mg Oral Daily   furosemide  80 mg Intravenous BID   insulin aspart  0-15 Units Subcutaneous Q4H    morphine injection  4 mg Intravenous Once   pantoprazole (PROTONIX) IV  40 mg Intravenous Q24H   Infusions:   sodium chloride 20 mL/hr at 07/01/22 2100   amiodarone 30 mg/hr (07/01/22 2117)   heparin 1,750 Units/hr (07/01/22 2100)   milrinone 0.125 mcg/kg/min (07/01/22 2100)   norepinephrine (LEVOPHED) Adult infusion 5 mcg/min (07/01/22 2100)  Assessment: 52yo male with history of DVT found to have acute PE  and large LV thrombus in setting of acute decompensated biventricular HF. Clot burden was low,  however TPA 100mg  iv x1 was administered due to hemodynamic compromise. Heparin was resumed 6/23 AM. Patient prescribed Eliquis PTA for DVT,  but was not taking medications.   Heparin level on 1750 units/hr is 0.19 still < goal but increased from this am with increase in heparin infusion. No bleeding or infusion issues per RN. Fibrinogen post TPA stable 142    Goal of Therapy:  Heparin level 0.3-0.5 units/ml until 6/24 0300 (~24h after tPA) then 0.3-0.7 units/ml Monitor platelets by anticoagulation protocol: Yes   Plan:  Increase heparin infusion to 1850 units/hr. Fibrinogen q 6 hours x4  Monitor daily heparin level and CBC daily     Leota Sauers Pharm.D. CPP, BCPS Clinical Pharmacist [phone] 07/01/2022 10:26 PM

## 2022-07-01 NOTE — Progress Notes (Signed)
eLink Physician-Brief Progress Note Patient Name: Jerry Saunders DOB: 1970/10/23 MRN: [id number]   Date of Service  07/01/2022  HPI/Events of Note  Pt complaining of generalized pain  eICU Interventions  Avoiding acetaminophen due to elevated LFTS.  Placed order  for tramadol 50mg  PO PRN. Will monitor response   .      Ginamarie Banfield M DELA CRUZ 07/01/2022, 10:30 PM  3:54 AM PT back in narrow complex tachycardia with HR 150s, BP 96/50s.  Currently on milrinone for heart failure.  Pt had a similar episode yesterday, and had been started on amiodarone and digoxin.  Will rebolus with amiodarone 150mg  IV x1.  Will follow response.

## 2022-07-01 NOTE — Progress Notes (Addendum)
eLink Physician-Brief Progress Note Patient Name: Jerry Saunders DOB: [birth year] MRN: [id number]   Date of Service  07/01/2022  HPI/Events of Note  51/M with NICM EF <15%, hx of DVT, LV thrombus, DM, severe pulmonary hypertension, presetning with shortness of breath and fatigue.  CTA showing RLL proximal segment PE.  Pt started on levophed.    Pt is awake and alert.  BP 101/81, HR 89, RR 105, RR 28, O2 sats 97%.  eICU Interventions  Pt given TPA.  Continue levophed to keep MAP >65.  Continue O2 support.  Monitor for signs of bleeding.  Check PTT. Protonix for DVT prophylaxis.  Cardiology recommending to start Lasix gtt after Lasix 40mg  IV.       Intervention Category Evaluation Type: New Patient Evaluation  Larinda Buttery 07/01/2022, 1:58 AM

## 2022-07-01 NOTE — Progress Notes (Signed)
Echocardiogram 2D Echocardiogram has been performed.  Warren Lacy Mauri Temkin RDCS 07/01/2022, 9:28 AM

## 2022-07-01 NOTE — Consult Note (Addendum)
Advanced Heart Failure Team Consult Note   Primary Physician: Pcp, No PCP-Cardiologist:  Dr. Odis Hollingshead  Reason for Consultation: Cardiogenic shock  HPI:    Jerry Saunders is seen today for evaluation of cardiogenic shcok at the request of Dr. Odis Hollingshead.   Jerry Saunders is a 52 y.o. male with systolic HF due to [facility], DM2.obesity, PVCs and noncompliance.   Diagnosed with HF in 6/23. - Echo 6/23: EF < 20% RV severely HK - Cath 6/23 No CAD. RA 16 PA 67/43 (54) PCW 27 Fick 5.8/2.3 - cMRI EF 20% RV 27% Minimal LGE  Admitted overnight with severeal weeks of HF symptoms after not taking meds for more than a month.Also reported syncopal episode several days ago.  CT in ER Showed right lower lobe proximal segmental pulmonary embolism, minimal clot burden with no evidence of right heart strain. He was tachycardic and visibly SOB requiring Bipap. Received t-PA at 330a per CCM.  Seen by Dr. Odis Hollingshead early this am after patient developed AFL at 150. Moved to ICU. Started on IV amio and low-dose NE.   Remains SOB. Denies CP.   Echo today EF 20% RV severely down Large LV clot   Review of Systems: [y] = yes, [ ]  = no   General: Weight gain [ ] ; Weight loss [ ] ; Anorexia [ ] ; Fatigue Cove.Etienne ]; Fever [ ] ; Chills [ ] ; Weakness [ ]   Cardiac: Chest pain/pressure [ ] ; Resting SOB [ y]; Exertional SOB [ y]; Orthopnea [ y]; Pedal Edema [ y]; Palpitations [ ] ; Syncope [ ] ; Presyncope [ ] ; Paroxysmal nocturnal dyspnea[y ]  Pulmonary: Cough [ ] ; Wheezing[ ] ; Hemoptysis[ ] ; Sputum [ y]; Snoring [ y]  GI: Vomiting[ ] ; Dysphagia[ ] ; Melena[ ] ; Hematochezia [ ] ; Heartburn[ ] ; Abdominal pain [ ] ; Constipation [ ] ; Diarrhea [ ] ; BRBPR [ ]   GU: Hematuria[ ] ; Dysuria [ ] ; Nocturia[ ]   Vascular: Pain in legs with walking [ ] ; Pain in feet with lying flat [ ] ; Non-healing sores [ ] ; Stroke [ ] ; TIA [ ] ; Slurred speech [ ] ;  Neuro: Headaches[ ] ; Vertigo[ ] ; Seizures[ ] ; Paresthesias[ ] ;Blurred vision [ ] ; Diplopia [ ] ; Vision  changes [ ]   Ortho/Skin: Arthritis [ y]; Joint pain Cove.Etienne ]; Muscle pain [ ] ; Joint swelling [ ] ; Back Pain [ ] ; Rash [ ]   Psych: Depression[ ] ; Anxiety[ ]   Heme: Bleeding problems [ ] ; Clotting disorders [ ] ; Anemia [ ]   Endocrine: Diabetes Cove.Etienne ]; Thyroid dysfunction[ ]   Home Medications Prior to Admission medications   Medication Sig Start Date End Date Taking? Authorizing Provider  apixaban (ELIQUIS) 5 MG TABS tablet Take 1 tablet (5 mg total) by mouth 2 (two) times daily. 11/14/21  Yes Tolia, Sunit, DO  Artificial Tear Ointment (DRY EYES OP) Place 2 drops into both eyes 2 (two) times daily as needed (dry eyes).    [provider]  losartan (COZAAR) 25 MG tablet Take 1 tablet (25 mg total) by mouth daily at 10 pm. 01/18/22 02/17/22  Tolia, Sunit, DO  metoprolol succinate (TOPROL XL) 50 MG 24 hr tablet Take 1 tablet (50 mg total) by mouth every morning. Hold if systolic blood pressure (top number) less than 100 mmHg or pulse less than 60 bpm. 01/18/22 04/18/22  Tolia, Sunit, DO  sacubitril-valsartan (ENTRESTO) 49-51 MG Take 1 tablet by mouth 2 (two) times daily. 01/30/22   Tolia, Sunit, DO  spironolactone (ALDACTONE) 25 MG tablet Take 1 tablet (25 mg total) by mouth  in the morning. 01/18/22 04/18/22  Tolia, Sunit, DO  torsemide (DEMADEX) 10 MG tablet Take 1 tablet (10 mg total) by mouth every morning. 01/18/22 02/17/22  Tessa Lerner, DO    Past Medical History: Past Medical History:  Diagnosis Date   Cardiomyopathy (HCC)    CHF (congestive heart failure) (HCC)    Diabetes mellitus without complication (HCC)    DVT (deep venous thrombosis) (HCC)    Hyperlipidemia    Hypertension     Past Surgical History: Past Surgical History:  Procedure Laterality Date   RIGHT/LEFT HEART CATH AND CORONARY ANGIOGRAPHY N/A 06/20/2021   Procedure: RIGHT/LEFT HEART CATH AND CORONARY ANGIOGRAPHY;  Surgeon: Elder Negus, MD;  Location: [facility] INVASIVE CV LAB;  Service: Cardiovascular;  Laterality: N/A;     Family History: History reviewed. No pertinent family history.  Social History: Social History   Socioeconomic History   Marital status: Married    Spouse name: [name]   Number of children: 2   Years of education: Not on file   Highest education level: Not on file  Occupational History   Not on file  Tobacco Use   Smoking status: Never   Smokeless tobacco: Not on file  Vaping Use   Vaping Use: Never used  Substance and Sexual Activity   Alcohol use: No   Drug use: Never   Sexual activity: Not on file  Other Topics Concern   Not on file  Social History Narrative   1 step daughter   Social Determinants of Health   Financial Resource Strain: Not on file  Food Insecurity: Not on file  Transportation Needs: Not on file  Physical Activity: Not on file  Stress: Not on file  Social Connections: Not on file    Allergies:  No Known Allergies  Objective:    Vital Signs:   Temp:  [98.4 F (36.9 C)-102.1 F (38.9 C)] 98.4 F (36.9 C) (06/23 0800) Pulse Rate:  [43-155] 155 (06/23 0700) Resp:  [11-40] 27 (06/23 0700) BP: (82-117)/(47-96) 97/73 (06/23 0700) SpO2:  [86 %-100 %] 99 % (06/23 0700) Weight:  [124.8 kg-133.4 kg] 124.8 kg (06/23 0200) Last BM Date : 06/30/22  Weight change: Filed Weights   06/30/22 1806 07/01/22 0200  Weight: 133.4 kg 124.8 kg    Intake/Output:   Intake/Output Summary (Last 24 hours) at 07/01/2022 0905 Last data filed at 07/01/2022 0700 Gross per 24 hour  Intake 1681.07 ml  Output 1100 ml  Net 581.07 ml      Physical Exam    General:  Sitting up in bed SOB HEENT: normal Neck: supple. JVP to ear . Carotids 2+ bilat; no bruits. No lymphadenopathy or thyromegaly appreciated. Cor:  Regular rate & rhythm. + s3 2/6 MR/TR Lungs: + crackles Abdomen: obese soft, nontender, nondistended. No hepatosplenomegaly. No bruits or masses. Good bowel sounds. Extremities: no cyanosis, clubbing, rash, 2+ edema cool  Neuro: alert &  orientedx3, cranial nerves grossly intact. moves all 4 extremities w/o difficulty. Affect pleasant   Telemetry   AFL 150 -> Sinus 90s Personally reviewed  EKG    Sinus 90s Personally reviewed  Labs   Basic Metabolic Panel: Recent Labs  Lab 06/30/22 1810 06/30/22 2010 07/01/22 0336 07/01/22 0337 07/01/22 0350 07/01/22 0702  NA 133*  --   --  132* 135 135  K 3.6  --   --  3.6 3.5 3.7  CL 98  --   --  99  --  97*  CO2 24  --   --  22  --  23  GLUCOSE 139*  --   --  145*  --  125*  BUN 31*  --   --  27*  --  26*  CREATININE 1.21  --   --  1.06  --  1.05  CALCIUM 8.0*  --   --  7.8*  --  7.8*  MG  --  2.4 2.3  --   --  2.1  PHOS  --   --  3.9  --   --  3.6    Liver Function Tests: Recent Labs  Lab 07/01/22 0337  AST 192*  ALT 448*  ALKPHOS 79  BILITOT 3.1*  PROT 6.6  ALBUMIN 3.0*   No results for input(s): "LIPASE", "AMYLASE" in the last 168 hours. No results for input(s): "AMMONIA" in the last 168 hours.  CBC: Recent Labs  Lab 06/30/22 1810 07/01/22 0336 07/01/22 0350  WBC 9.4 9.9  --   HGB 11.5* 11.5* 12.2*  HCT 35.9* 34.8* 36.0*  MCV 85.5 84.9  --   PLT 239 218  --     Cardiac Enzymes: No results for input(s): "CKTOTAL", "CKMB", "CKMBINDEX", "TROPONINI" in the last 168 hours.  BNP: BNP (last 3 results) Recent Labs    06/30/22 1810 07/01/22 0337  BNP 2,206.2* 1,884.1*    ProBNP (last 3 results) Recent Labs    01/19/22 1550  PROBNP 540*     CBG: Recent Labs  Lab 07/01/22 0350  GLUCAP 141*    Coagulation Studies: Recent Labs    07/01/22 0337  LABPROT 23.1*  INR 2.0*     Imaging   CT Angio Chest PE W and/or Wo Contrast  Result Date: 06/30/2022 CLINICAL DATA:  Cough, short of breath, febrile, suspected pulmonary embolus EXAM: CT ANGIOGRAPHY CHEST WITH CONTRAST TECHNIQUE: Multidetector CT imaging of the chest was performed using the standard protocol during bolus administration of intravenous contrast. Multiplanar CT image  reconstructions and MIPs were obtained to evaluate the vascular anatomy. RADIATION DOSE REDUCTION: This exam was performed according to the departmental dose-optimization program which includes automated exposure control, adjustment of the mA and/or kV according to patient size and/or use of iterative reconstruction technique. CONTRAST:  75mL OMNIPAQUE IOHEXOL 350 MG/ML SOLN COMPARISON:  06/30/2022, 06/18/2021 FINDINGS: Cardiovascular: This is a technically adequate evaluation of the pulmonary vasculature. There is a proximal segmental pulmonary embolus within the right lower lobe. Minimal clot burden. No evidence of right heart strain, with chronic dilatation of the cardiac chambers again noted. Continued cardiomegaly without pericardial effusion. Normal caliber of the thoracic aorta. Mediastinum/Nodes: No enlarged mediastinal, hilar, or axillary lymph nodes. Thyroid gland, trachea, and esophagus demonstrate no significant findings. Lungs/Pleura: No acute airspace disease, effusion, or pneumothorax. Scattered hypoventilatory changes are seen at the lung bases. Sub solid 9 mm subpleural right lower lobe pulmonary nodule reference image 100/7 is noted, new since prior exam. Upper Abdomen: No acute abnormality. Musculoskeletal: No acute or destructive bony abnormalities. Reconstructed images demonstrate no additional findings. Review of the MIP images confirms the above findings. IMPRESSION: 1. Right lower lobe proximal segmental pulmonary embolus. Minimal clot burden with no evidence of right heart strain. 2. Stable cardiomegaly, with marked dilatation of the cardiac chambers again noted. No pericardial effusion. 3. Right part-solid pulmonary nodule measuring 9 mm. Per Fleischner Society Guidelines, recommend a non-contrast Chest CT at 3-6 months to confirm persistence. If unchanged and the solid component remains < 6 mm, an annual non-contrast Chest CT should be performed for 5 years.  If the solid component is 6-8  mm on follow-up, recommend biopsy or resection. If the solid component is > 8 mm on follow-up, recommend PET/CT, biopsy or resection. These guidelines do not apply to immunocompromised patients and patients with cancer. Follow up in patients with significant comorbidities as clinically warranted. For lung cancer screening, adhere to Lung-RADS guidelines. Reference: Radiology. 2017; 284(1):228-43. Critical Value/emergent results were called by telephone at the time of interpretation on 06/30/2022 at 10:30 pm to provider [facility] , who verbally acknowledged these results. Electronically Signed   By: Sharlet Salina M.D.   On: 06/30/2022 22:31   DG Chest 2 View  Result Date: 06/30/2022 CLINICAL DATA:  Elevated temperature, cough and shortness of breath. EXAM: CHEST - 2 VIEW COMPARISON:  Chest radiograph dated June 18, 2021 FINDINGS: The heart is enlarged. Pulmonary vascular congestion and bilateral perihilar interstitial opacities suggesting mild pulmonary edema. No appreciable pleural effusion. IMPRESSION: Cardiomegaly with pulmonary vascular congestion and mild pulmonary edema. Electronically Signed   By: Larose Hires D.O.   On: 06/30/2022 18:50     Medications:     Current Medications:  Chlorhexidine Gluconate Cloth  6 each Topical Daily   metoprolol tartrate  2.5 mg Intravenous Once    morphine injection  4 mg Intravenous Once   pantoprazole (PROTONIX) IV  40 mg Intravenous Q24H    Infusions:  sodium chloride 20 mL/hr at 07/01/22 0700   amiodarone     amiodarone 30 mg/hr (07/01/22 0841)   amiodarone     heparin 1,500 Units/hr (07/01/22 0700)   norepinephrine (LEVOPHED) Adult infusion 3 mcg/min (07/01/22 0700)      Assessment/Plan   1. 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 RA 16 PA 67/43 (54) PCW 27 Fick 5.8/2.3 - cMRI EF 20% RV 27% Minimal LGE - Echo today EF 20% RV severely down Large LV clot -  Suspect PE is incidental due to severe RV stasis. Main issues is advanced HF - Now on NE for inotropic and BP support. LA 1.7 - Central line pending. Will likely need milrinone.  - Volume overloaded -> diurese - Ideally needs transplant but non-compliance major issue - Will add digoxin - GDMT titration as BP permits when off inotropic support  2. Recurrent PE - CT 06/30/22 - Right lower lobe proximal segmental pulmonary embolus. Minimal clot burden with no evidence of right heart strain.  - s/p t-PA at 330a on 06/30/22 - Suspect PE is incidental due to severe RV stasis. Main issues is advanced HF - continue heparin - u/s lower extremities  3. AFL with RVR - now in NSR on IV amio - continue heparin - eventual DOAC  4. LV thrombus - anticoagulation as above  5. DM2 - per primary  6. Shock liver - continue hemodynamic support  7. Morbid obesity - Body mass index is 35.33 kg/m.  8. Non-compliance  CRITICAL CARE Performed by: Arvilla Meres  Total critical care time: 55 minutes  Critical care time was exclusive of separately billable procedures and treating other patients.  Critical care was necessary to treat or prevent imminent or life-threatening deterioration.  Critical care was time spent personally by me (independent of midlevel providers or residents) on the following activities: development of treatment plan with patient and/or surrogate as well as nursing, discussions with consultants, evaluation of patient's response to treatment, examination of patient, obtaining history from patient or  surrogate, ordering and performing treatments and interventions, ordering and review of laboratory studies, ordering and review of radiographic studies, pulse oximetry and re-evaluation of patient's condition.  Arvilla Meres, MD  9:14 AM   Length of Stay: 0  Arvilla Meres, MD  07/01/2022, 9:05 AM  Advanced Heart Failure Team Pager [phone] (M-F; 7a - 5p)  Please  contact [facility] for night-coverage after hours (4p -7a ) and weekends on [email or web address]

## 2022-07-02 ENCOUNTER — Inpatient Hospital Stay (HOSPITAL_COMMUNITY): Payer: 59

## 2022-07-02 ENCOUNTER — Other Ambulatory Visit (HOSPITAL_COMMUNITY): Payer: Self-pay

## 2022-07-02 DIAGNOSIS — R609 Edema, unspecified: Secondary | ICD-10-CM | POA: Diagnosis not present

## 2022-07-02 DIAGNOSIS — I2699 Other pulmonary embolism without acute cor pulmonale: Secondary | ICD-10-CM

## 2022-07-02 DIAGNOSIS — R579 Shock, unspecified: Secondary | ICD-10-CM | POA: Diagnosis not present

## 2022-07-02 DIAGNOSIS — J9601 Acute respiratory failure with hypoxia: Secondary | ICD-10-CM | POA: Diagnosis not present

## 2022-07-02 DIAGNOSIS — R570 Cardiogenic shock: Secondary | ICD-10-CM

## 2022-07-02 DIAGNOSIS — I4729 Other ventricular tachycardia: Secondary | ICD-10-CM

## 2022-07-02 DIAGNOSIS — I513 Intracardiac thrombosis, not elsewhere classified: Secondary | ICD-10-CM

## 2022-07-02 DIAGNOSIS — I4892 Unspecified atrial flutter: Secondary | ICD-10-CM

## 2022-07-02 DIAGNOSIS — I509 Heart failure, unspecified: Secondary | ICD-10-CM | POA: Diagnosis not present

## 2022-07-02 DIAGNOSIS — I5023 Acute on chronic systolic (congestive) heart failure: Secondary | ICD-10-CM | POA: Diagnosis not present

## 2022-07-02 LAB — CBC
HCT: 36.2 % — ABNORMAL LOW (ref 39.0–52.0)
Hemoglobin: 11.2 g/dL — ABNORMAL LOW (ref 13.0–17.0)
MCH: 27.1 pg (ref 26.0–34.0)
MCHC: 30.9 g/dL (ref 30.0–36.0)
MCV: 87.4 fL (ref 80.0–100.0)
Platelets: 198 10*3/uL (ref 150–400)
RBC: 4.14 MIL/uL — ABNORMAL LOW (ref 4.22–5.81)
RDW: 15.1 % (ref 11.5–15.5)
WBC: 10.8 10*3/uL — ABNORMAL HIGH (ref 4.0–10.5)
nRBC: 0.2 % (ref 0.0–0.2)

## 2022-07-02 LAB — BASIC METABOLIC PANEL
Anion gap: 10 (ref 5–15)
BUN: 28 mg/dL — ABNORMAL HIGH (ref 6–20)
CO2: 24 mmol/L (ref 22–32)
Calcium: 7.7 mg/dL — ABNORMAL LOW (ref 8.9–10.3)
Chloride: 99 mmol/L (ref 98–111)
Creatinine, Ser: 1.16 mg/dL (ref 0.61–1.24)
GFR, Estimated: >60 mL/min (ref >60)
Glucose, Bld: 139 mg/dL — ABNORMAL HIGH (ref 70–99)
Potassium: 4 mmol/L (ref 3.5–5.1)
Sodium: 133 mmol/L — ABNORMAL LOW (ref 135–145)

## 2022-07-02 LAB — CULTURE, BLOOD (ROUTINE X 2)

## 2022-07-02 LAB — GLUCOSE, CAPILLARY
Glucose-Capillary: 131 mg/dL — ABNORMAL HIGH (ref 70–99)
Glucose-Capillary: 133 mg/dL — ABNORMAL HIGH (ref 70–99)
Glucose-Capillary: 147 mg/dL — ABNORMAL HIGH (ref 70–99)
Glucose-Capillary: 154 mg/dL — ABNORMAL HIGH (ref 70–99)
Glucose-Capillary: 162 mg/dL — ABNORMAL HIGH (ref 70–99)
Glucose-Capillary: 184 mg/dL — ABNORMAL HIGH (ref 70–99)
Glucose-Capillary: 242 mg/dL — ABNORMAL HIGH (ref 70–99)

## 2022-07-02 LAB — HEPATIC FUNCTION PANEL
ALT: 938 U/L — ABNORMAL HIGH (ref 0–44)
AST: 781 U/L — ABNORMAL HIGH (ref 15–41)
Albumin: 2.8 g/dL — ABNORMAL LOW (ref 3.5–5.0)
Alkaline Phosphatase: 73 U/L (ref 38–126)
Bilirubin, Direct: 0.8 mg/dL — ABNORMAL HIGH (ref 0.0–0.2)
Indirect Bilirubin: 1.2 mg/dL — ABNORMAL HIGH (ref 0.3–0.9)
Total Bilirubin: 2 mg/dL — ABNORMAL HIGH (ref 0.3–1.2)
Total Protein: 6.4 g/dL — ABNORMAL LOW (ref 6.5–8.1)

## 2022-07-02 LAB — COOXEMETRY PANEL
Carboxyhemoglobin: 1.9 % — ABNORMAL HIGH (ref 0.5–1.5)
Carboxyhemoglobin: 1.7 % — ABNORMAL HIGH (ref 0.5–1.5)
Methemoglobin: <0.7 % (ref 0.0–1.5)
Methemoglobin: <0.7 % (ref 0.0–1.5)
O2 Saturation: 58.8 %
O2 Saturation: 62.3 %
Total hemoglobin: 11.9 g/dL — ABNORMAL LOW (ref 12.0–16.0)
Total hemoglobin: 11.5 g/dL — ABNORMAL LOW (ref 12.0–16.0)

## 2022-07-02 LAB — HEPARIN LEVEL (UNFRACTIONATED)
Heparin Unfractionated: 0.14 IU/mL — ABNORMAL LOW (ref 0.30–0.70)
Heparin Unfractionated: 0.47 IU/mL (ref 0.30–0.70)
Heparin Unfractionated: 0.32 IU/mL (ref 0.30–0.70)

## 2022-07-02 LAB — PHOSPHORUS: Phosphorus: 3.4 mg/dL (ref 2.5–4.6)

## 2022-07-02 LAB — MAGNESIUM: Magnesium: 2.2 mg/dL (ref 1.7–2.4)

## 2022-07-02 MED ORDER — AMIODARONE LOAD VIA INFUSION
150.0000 mg | Freq: Once | INTRAVENOUS | Status: AC
  Administered 2022-07-02: 150 mg via INTRAVENOUS
  Filled 2022-07-02: qty 83.34

## 2022-07-02 MED ORDER — AMIODARONE IV BOLUS ONLY 150 MG/100ML: 150.0000 mg | Freq: Once | INTRAVENOUS | Status: DC

## 2022-07-02 NOTE — TOC Benefit Eligibility Note (Addendum)
Pharmacy Patient Advocate Encounter  Insurance verification completed.    The patient is insured through [employer] for [employer] 24-26 mg and the current 30 day co-pay is $656.46.  Ran test claim for Eliquis 5 mg and the current 30 day co-pay is $45.00.  Ran test claim for Jardiance 10 mg and Requires Prior Authorization  Ran test claim for Farxiga 10 mg and Requires Prior Authorization   This test claim was processed through [employer]- copay amounts may vary at other pharmacies due to [employer], or as the patient moves through the different stages of their insurance plan.    Roland Earl, CPHT Pharmacy Patient Advocate Specialist [facility] Patient Advocate Team Direct Number: [phone]  Fax: [phone]

## 2022-07-02 NOTE — Progress Notes (Addendum)
Advanced Heart Failure Rounding Note  PCP-Cardiologist: None   Subjective:   Patient admitted with cardiogenic shock 2/2 noncompliance. CT showed RLL PE. S/p TPA 07/01/22: went into AFL RVR. Started on IV amio>>NSR.   co-ox 62% on milrinone 0.125 mcg/kg/min Levo @ 3  SCr 1.16, K 5. -2.6L UOP, down 2lbs. Diuresing with 80 IV lasix BID. CVP 20  Feels fine this morning. Did not tolerate BiPAP overnight, stable on venti mask. Denies chest pain. Breathing stable.   Objective:   Echo 07/01/22: EF 20% RV severely down Large LV clot   Weight Range: 124 kg Body mass index is 35.1 kg/m.   Vital Signs:   Temp:  [98 F (36.7 C)-100 F (37.8 C)] 98.6 F (37 C) (06/24 0400) Pulse Rate:  [72-155] 96 (06/24 0645) Resp:  [0-38] 26 (06/24 0645) BP: (84-155)/(59-125) 98/73 (06/24 0645) SpO2:  [89 %-100 %] 98 % (06/24 0645) FiO2 (%):  [55 %] 55 % (06/23 2255) Weight:  [124 kg] 124 kg (06/24 0500) Last BM Date : 06/30/22  Weight change: Filed Weights   06/30/22 1806 07/01/22 0200 07/02/22 0500  Weight: 133.4 kg 124.8 kg 124 kg   Intake/Output:   Intake/Output Summary (Last 24 hours) at 07/02/2022 0657 Last data filed at 07/02/2022 0600 Gross per 24 hour  Intake 2822.37 ml  Output 2650 ml  Net 172.37 ml     Physical Exam  CVP 20 General:  well appearing.  No respiratory difficulty HEENT: normal. + venti mask Neck: supple. JVD to jaw. Carotids 2+ bilat; no bruits. No lymphadenopathy or thyromegaly appreciated. LIJ CVC Cor: PMI nondisplaced. Regular rate & rhythm. No rubs, gallops or murmurs. Lungs: diminished bases Abdomen: soft, nontender, nondistended. No hepatosplenomegaly. No bruits or masses. Good bowel sounds. Extremities: no cyanosis, clubbing, rash, +1 BLE edema.  Neuro: alert & oriented x 3, cranial nerves grossly intact. moves all 4 extremities w/o difficulty. Affect pleasant.   Telemetry   NSR 90s, short runs NSVT (Personally reviewed)    EKG    No new EKG to  review  Labs    CBC Recent Labs    07/01/22 0336 07/01/22 0350 07/02/22 0354  WBC 9.9  --  10.8*  HGB 11.5* 12.2* 11.2*  HCT 34.8* 36.0* 36.2*  MCV 84.9  --  87.4  PLT 218  --  198   Basic Metabolic Panel Recent Labs    16/10/96 0702 07/02/22 0354  NA 135 133*  K 3.7 4.0  CL 97* 99  CO2 23 24  GLUCOSE 125* 139*  BUN 26* 28*  CREATININE 1.05 1.16  CALCIUM 7.8* 7.7*  MG 2.1 2.2  PHOS 3.6 3.4   Liver Function Tests Recent Labs    07/01/22 0337  AST 192*  ALT 448*  ALKPHOS 79  BILITOT 3.1*  PROT 6.6  ALBUMIN 3.0*   No results for input(s): "LIPASE", "AMYLASE" in the last 72 hours. Cardiac Enzymes No results for input(s): "CKTOTAL", "CKMB", "CKMBINDEX", "TROPONINI" in the last 72 hours.  BNP: BNP (last 3 results) Recent Labs    06/30/22 1810 07/01/22 0337  BNP 2,206.2* 1,884.1*   ProBNP (last 3 results) Recent Labs    01/19/22 1550  PROBNP 540*   D-Dimer No results for input(s): "DDIMER" in the last 72 hours. Hemoglobin A1C No results for input(s): "HGBA1C" in the last 72 hours. Fasting Lipid Panel No results for input(s): "CHOL", "HDL", "LDLCALC", "TRIG", "CHOLHDL", "LDLDIRECT" in the last 72 hours. Thyroid Function Tests No results for  input(s): "TSH", "T4TOTAL", "T3FREE", "THYROIDAB" in the last 72 hours.  Invalid input(s): "FREET3"  Other results:  Imaging   DG CHEST PORT 1 VIEW  Result Date: 07/01/2022 CLINICAL DATA:  Catheter placement. EXAM: PORTABLE CHEST 1 VIEW COMPARISON:  June 30, 2022. FINDINGS: Stable cardiomegaly. Interval placement of left subclavian catheter with distal tip in expected position of the SVC. No pneumothorax is noted. Central pulmonary vascular congestion is again noted. Possible bilateral pulmonary edema is noted. Bony thorax is unremarkable. IMPRESSION: Interval placement of left subclavian catheter with distal tip in expected position of the SVC. No pneumothorax. Electronically Signed   By: Lupita Raider M.D.    On: 07/01/2022 16:05   ECHOCARDIOGRAM COMPLETE  Result Date: 07/01/2022    ECHOCARDIOGRAM REPORT   Patient Name:   JOVANIE VERGE Date of Exam: 07/01/2022 Medical Rec #:  [id number]     Height:       74.0 in Accession #:    [id number]    Weight:       275.1 lb Date of Birth:  Apr 03, 1970     BSA:          2.489 m Patient Age:    52 years      BP:           100/79 mmHg Patient Gender: M             HR:           100 bpm. Exam Location:  Inpatient Procedure: 2D Echo, Color Doppler, Cardiac Doppler and Intracardiac            Opacification Agent Indications:     I50.9* Heart failure (unspecified); I26.02 Pulmonary embolus  History:         Patient has prior history of Echocardiogram examinations, most                  recent 06/19/2021. CHF, Pulmonary HTN, Arrythmias:Atrial                  Fibrillation; Risk Factors:Hypertension, Diabetes and                  Dyslipidemia.  Sonographer:     Irving Burton Senior RDCS Referring Phys:  [id number] Tessa Lerner Diagnosing Phys: Tessa Lerner DO IMPRESSIONS  1. Left ventricular thrombus 2.7 x 1.6cm (largest dimension). Left ventricular ejection fraction, by estimation, is <20%. The left ventricle has severely decreased function. The left ventricle demonstrates global hypokinesis. The left ventricular internal cavity size was severely dilated. Left ventricular diastolic parameters are consistent with Grade III diastolic dysfunction (restrictive). There is the interventricular septum is flattened in systole and diastole, consistent with right ventricular pressure and volume overload.  2. Degree of pulmonary hypertension is underestimated due to dialted RA and RV size. Right ventricular systolic function is severely reduced. The right ventricular size is severely enlarged. There is moderately elevated pulmonary artery systolic pressure. The estimated right ventricular systolic pressure is 49.3 mmHg.  3. Left atrial size was severely dilated.  4. Right atrial size was severely dilated.  5.  The mitral valve is degenerative. Moderate to severe mitral valve regurgitation. No evidence of mitral stenosis.  6. Tricuspid valve regurgitation is moderate to severe.  7. The aortic valve is tricuspid. Aortic valve regurgitation is not visualized. No aortic stenosis is present.  8. The inferior vena cava is dilated in size with <50% respiratory variability, suggesting right atrial pressure of 15 mmHg. Comparison(s): Prior echo 06/19/2021: LVEF <20%, global  HK, LV dilated, RV dilated and reduced function, RAP . Conclusion(s)/Recommendation(s): Findings consistent with Dilated biventricular heart failure. Left ventricular thrombus present as noted above. FINDINGS  Left Ventricle: Left ventricular thrombus 2.7 x 1.6cm (largest dimension). Left ventricular ejection fraction, by estimation, is <20%. The left ventricle has severely decreased function. The left ventricle demonstrates global hypokinesis. Definity contrast agent was given IV to delineate the left ventricular endocardial borders. The left ventricular internal cavity size was severely dilated. There is no left ventricular hypertrophy. The interventricular septum is flattened in systole and diastole,  consistent with right ventricular pressure and volume overload. Left ventricular diastolic parameters are consistent with Grade III diastolic dysfunction (restrictive). Right Ventricle: Degree of pulmonary hypertension is underestimated due to dialted RA and RV size. The right ventricular size is severely enlarged. Right vetricular wall thickness was not well visualized. Right ventricular systolic function is severely reduced. There is moderately elevated pulmonary artery systolic pressure. The tricuspid regurgitant velocity is 2.93 m/s, and with an assumed right atrial pressure of 15 mmHg, the estimated right ventricular systolic pressure is 49.3 mmHg. Left Atrium: Left atrial size was severely dilated. Right Atrium: Right atrial size was severely  dilated. Pericardium: There is no evidence of pericardial effusion. Mitral Valve: The mitral valve is degenerative in appearance. There is mild thickening of the mitral valve leaflet(s). Moderate to severe mitral valve regurgitation, with centrally-directed jet. No evidence of mitral valve stenosis. Tricuspid Valve: The tricuspid valve is grossly normal. Tricuspid valve regurgitation is moderate to severe. No evidence of tricuspid stenosis. Aortic Valve: The aortic valve is tricuspid. Aortic valve regurgitation is not visualized. No aortic stenosis is present. Pulmonic Valve: The pulmonic valve was grossly normal. Pulmonic valve regurgitation is mild. No evidence of pulmonic stenosis. Aorta: The aortic root and ascending aorta are structurally normal, with no evidence of dilitation. Venous: The inferior vena cava is dilated in size with less than 50% respiratory variability, suggesting right atrial pressure of 15 mmHg. IAS/Shunts: The interatrial septum was not well visualized.  LEFT VENTRICLE PLAX 2D LVIDd:         6.90 cm      Diastology LVIDs:         6.70 cm      LV e' medial:    4.68 cm/s LV PW:         0.90 cm      LV E/e' medial:  17.3 LV IVS:        0.80 cm      LV e' lateral:   9.57 cm/s LVOT diam:     1.80 cm      LV E/e' lateral: 8.5 LV SV:         26 LV SV Index:   10 LVOT Area:     2.54 cm  LV Volumes (MOD) LV vol d, MOD A2C: 232.0 ml LV vol d, MOD A4C: 281.0 ml LV vol s, MOD A2C: 218.0 ml LV vol s, MOD A4C: 212.0 ml LV SV MOD A2C:     14.0 ml LV SV MOD A4C:     281.0 ml LV SV MOD BP:      49.6 ml RIGHT VENTRICLE RV S prime:     12.10 cm/s TAPSE (M-mode): 1.3 cm LEFT ATRIUM              Index        RIGHT ATRIUM           Index LA diam:  5.20 cm  2.09 cm/m   RA Area:     35.00 cm LA Vol (A2C):   169.0 ml 67.90 ml/m  RA Volume:   135.00 ml 54.24 ml/m LA Vol (A4C):   133.0 ml 53.44 ml/m LA Biplane Vol: 166.0 ml 66.69 ml/m  AORTIC VALVE LVOT Vmax:   73.40 cm/s LVOT Vmean:  56.200 cm/s LVOT  VTI:    0.101 m  AORTA Ao Root diam: 3.10 cm Ao Asc diam:  3.10 cm MITRAL VALVE                  TRICUSPID VALVE MV Area (PHT): 3.60 cm       TR Peak grad:   34.3 mmHg MV Decel Time: 211 msec       TR Vmax:        293.00 cm/s MR Peak grad:    50.1 mmHg MR Mean grad:    35.0 mmHg    SHUNTS MR Vmax:         354.00 cm/s  Systemic VTI:  0.10 m MR Vmean:        278.0 cm/s   Systemic Diam: 1.80 cm MR PISA:         2.26 cm MR PISA Eff ROA: 25 mm MR PISA Radius:  0.60 cm MV E velocity: 80.90 cm/s Sunit Tolia DO Electronically signed by Tessa Lerner DO Signature Date/Time: 07/01/2022/11:12:36 AM    Final     Medications:     Scheduled Medications:  Chlorhexidine Gluconate Cloth  6 each Topical Daily   digoxin  0.125 mg Oral Daily   furosemide  80 mg Intravenous BID   insulin aspart  0-15 Units Subcutaneous Q4H    morphine injection  4 mg Intravenous Once   pantoprazole (PROTONIX) IV  40 mg Intravenous Q24H    Infusions:  sodium chloride 20 mL/hr at 07/02/22 0600   amiodarone 30 mg/hr (07/02/22 0600)   heparin 2,200 Units/hr (07/02/22 0630)   milrinone 0.125 mcg/kg/min (07/02/22 0600)   norepinephrine (LEVOPHED) Adult infusion 3 mcg/min (07/02/22 0600)    PRN Medications: docusate sodium, polyethylene glycol, traMADol  Patient Profile   Jerry Saunders is a 52 y.o. male with systolic HF due to [facility], DM2.obesity, PVCs and noncompliance. AHF to see for cardiogenic shock.   Assessment/Plan  1. 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 RA 16 PA 67/43 (54) PCW 27 Fick 5.8/2.3 - cMRI EF 20% RV 27% Minimal LGE - Echo 07/01/22 EF 20% RV severely down. Large LV clot - Suspect PE is incidental due to severe RV stasis. Main issues is advanced HF - Now on NE for inotropic and BP support. LA 1.7 - co-ox 62% on milrinone 0.125 mcg/kg/min - Volume overloaded -> on 80 IV lasix BID. Good diuresis. -2.6L UOP. CVP 20 - Continue  digoxin 0.125mg  daily - GDMT titration as BP permits when off inotropic support - Ideally needs transplant but non-compliance major issue   2. Recurrent PE - CT 06/30/22 - Right lower lobe proximal segmental pulmonary embolus. Minimal clot burden with no evidence of right heart strain.  - s/p t-PA at 330a on 06/30/22 - Suspect PE is incidental due to severe RV stasis. Main issues is advanced HF - continue heparin - u/s lower extremities   3. AFL with RVR - now in NSR on IV amio - continue heparin - eventual DOAC   4.  LV thrombus - anticoagulation as above   5. DM2 - per primary   6. Shock liver - continue hemodynamic support   7. Morbid obesity - Body mass index is 35.1 kg/m.    8. Non-compliance  Length of Stay: 1  Alen Bleacher, NP  07/02/2022, 6:57 AM  Advanced Heart Failure Team Pager [phone] (M-F; 7a - 5p)  Please contact [facility] for night-coverage after hours (5p -7a ) and weekends on [email or web address]  Agree with above.   Remains on milrinone 0.125 mcg/kg/min Levo @ 3. Co-ox 62% CVP 20   Remains in NSR on IV amio. On heparin  General:  Sitting in chair  No resp difficulty HEENT: normal Neck: supple. JVP to ear with prominent v waves  Carotids 2+ bilat; no bruits. No lymphadenopathy or thryomegaly appreciated. Cor:  Regular rate & rhythm. 2/6 TR 2/6 MR + s3  Lungs: clear Abdomen: soft, nontender, nondistended. No hepatosplenomegaly. No bruits or masses. Good bowel sounds. Extremities: no cyanosis, clubbing, rash, 2+ edema Neuro: alert & orientedx3, cranial nerves grossly intact. moves all 4 extremities w/o difficulty. Affect pleasant  Remains quite tenuous with biventricular HF. Continue inotropic support. Push diuresis. Continue heparin and amio. Discussed possible need for transplant eval and need for compliance. Keep in ICU.   Place TED hose.   CRITICAL CARE Performed by: Arvilla Meres  Total critical care time: 45 minutes  Critical care time  was exclusive of separately billable procedures and treating other patients.  Critical care was necessary to treat or prevent imminent or life-threatening deterioration.  Critical care was time spent personally by me (independent of midlevel providers or residents) on the following activities: development of treatment plan with patient and/or surrogate as well as nursing, discussions with consultants, evaluation of patient's response to treatment, examination of patient, obtaining history from patient or surrogate, ordering and performing treatments and interventions, ordering and review of laboratory studies, ordering and review of radiographic studies, pulse oximetry and re-evaluation of patient's condition.  Arvilla Meres, MD  11:04 AM

## 2022-07-02 NOTE — Progress Notes (Signed)
ANTICOAGULATION CONSULT NOTE -   Pharmacy Consult for heparin Indication: pulmonary embolus/DVT/LV thrombus  No Known Allergies  Patient Measurements: Height: 6\' 2"  (188 cm) Weight: 124 kg (273 lb 5.9 oz) IBW/kg (Calculated) : 82.2 Heparin Dosing Weight: 110kg  Vital Signs: Temp: 98.8 F (37.1 C) (06/24 2030) Temp Source: Oral (06/24 2030) BP: 83/64 (06/24 2100) Pulse Rate: 74 (06/24 2100)  Labs: Recent Labs    06/30/22 1810 06/30/22 2010 07/01/22 0336 07/01/22 0337 07/01/22 0350 07/01/22 0702 07/01/22 1127 07/02/22 0354 07/02/22 0514 07/02/22 1315 07/02/22 2012  HGB 11.5*  --  11.5*  --  12.2*  --   --  11.2*  --   --   --   HCT 35.9*  --  34.8*  --  36.0*  --   --  36.2*  --   --   --   PLT 239  --  218  --   --   --   --  198  --   --   --   APTT  --   --  38*  --   --   --   --   --   --   --   --   LABPROT  --   --   --  23.1*  --   --   --   --   --   --   --   INR  --   --   --  2.0*  --   --   --   --   --   --   --   HEPARINUNFRC  --   --   --   --   --   --    < >  --  0.14* 0.47 0.32  CREATININE 1.21  --   --  1.06  --  1.05  --  1.16  --   --   --   TROPONINIHS 72* 72*  --   --   --  63*  --   --   --   --   --    < > = values in this interval not displayed.     Estimated Creatinine Clearance: 106.6 mL/min (by C-G formula based on SCr of 1.16 mg/dL).  Assessment: 52 yo male with history of DVT found to have acute PE  and large LV thrombus in setting of acute decompensated biventricular HF. Clot burden was low,  however TPA 100mg  iv x1 was administered due to hemodynamic compromise. Heparin was resumed 6/23 AM. Patient prescribed Eliquis PTA for DVT,  but was not taking medications.   Heparin level down to low end of therapeutic (0.32) on infusion at 2200 units/hr. No bleeding noted.  Goal of Therapy:  Heparin level 0.3-0.7 units/ml Monitor platelets by anticoagulation protocol: Yes   Plan:  Increase IV heparin to 2300 units/hr to keep in  therapeutic range F/u daily heparin level and CBC  Christoper Fabian, PharmD, BCPS Please see amion for complete clinical pharmacist phone list  07/02/2022 9:55 PM

## 2022-07-02 NOTE — Progress Notes (Signed)
Bilateral lower extremity venous study completed.   Preliminary results relayed to MD and RN.  Please see CV Procedures for preliminary results.  Charlann Wayne, RVT  11:19 AM 07/02/22

## 2022-07-02 NOTE — TOC Initial Note (Signed)
Transition of Care [facility]) - Initial/Assessment Note    Patient Details  Name: Jerry Saunders MRN: [id number] Date of Birth: 10-21-1970  [facility] CM/SW Contact:    Elliot Cousin, RN Phone Number: [phone] 07/02/2022, 4:27 PM  Clinical Narrative:  HF TOC CM spoke to pt at bedside. States he works for [employer]. States he needs to note for job to state he is currently in hospital. Provided pt with note by attending. Discussed with pt FMLA/short term disability paperwork. States he will check with his HR to see if paperwork is needed. Pt reports having scale at home. States he will need PCP, will arrange appt with [facility] at [place] closer to his dc date. Office has new patient appts.                 Expected Discharge Plan: Home/Self Care     Patient Goals and CMS Choice Patient states their goals for this hospitalization and ongoing recovery are:: wants to remain independent          Expected Discharge Plan and Services                                              Prior Living Arrangements/Services     Patient language and need for interpreter reviewed:: Yes Do you feel safe going back to the place where you live?: Yes      Need for Family Participation in Patient Care: No (Comment) Care giver support system in place?: Yes (comment)   Criminal Activity/Legal Involvement Pertinent to Current Situation/Hospitalization: No - Comment as needed  Activities of Daily Living      Permission Sought/Granted Permission sought to share information with : Case Manager, Family Supports Permission granted to share information with : Yes, Verbal Permission Granted  Share Information with NAME: Anival Pasha     Permission granted to share info w Relationship: wife  Permission granted to share info w Contact Information: [phone]  Emotional Assessment Appearance:: Appears stated age Attitude/Demeanor/Rapport: Engaged Affect (typically  observed): Accepting Orientation: : Oriented to Self, Oriented to Place, Oriented to  Time, Oriented to Situation   Psych Involvement: No (comment)  Admission diagnosis:  Shock circulatory (HCC) [R57.9] Congestive heart failure, unspecified HF chronicity, unspecified heart failure type (HCC) [I50.9] Acute pulmonary embolism without acute cor pulmonale, unspecified pulmonary embolism type (HCC) [I26.99] Patient Active Problem List   Diagnosis Date Noted   Shock circulatory (HCC) 07/01/2022   Acute pulmonary embolism (HCC) 07/01/2022   Acute febrile illness 07/01/2022   Syncope and collapse 07/01/2022   Severe pulmonary hypertension (HCC) 07/01/2022   Elevated troponin I level 07/01/2022   Ventricular ectopy 07/01/2022   History of DVT (deep vein thrombosis) 07/01/2022   Morbid obesity (HCC) 07/01/2022   Noncompliance 07/01/2022   Non-insulin dependent type 2 diabetes mellitus (HCC) 07/01/2022   Biventricular heart failure with reduced left ventricular function (HCC) 07/01/2022   Acute decompensated heart failure (HCC) 07/01/2022   New onset atrial flutter (HCC) 07/01/2022   DVT (deep venous thrombosis) (HCC) 06/20/2021   Acute combined systolic and diastolic congestive heart failure (HCC) 06/20/2021   CHF (congestive heart failure) (HCC) 06/18/2021   Acute hypoxic respiratory failure (HCC) 06/18/2021   Elevated d-dimer 06/18/2021   Type 2 diabetes mellitus (HCC) 06/18/2021   Normocytic anemia 06/18/2021   PCP:  Pcp, No Pharmacy:  [facility] [address] ([address] [place] - [address] [place] [address] Phone: [phone] Fax: [phone]     Social Determinants of Health (SDOH) Social History: SDOH Screenings   Food Insecurity: No Food Insecurity (07/02/2022)  Housing: Low Risk  (07/02/2022)  Transportation Needs: No Transportation Needs (07/02/2022)  Utilities: Not At Risk (07/02/2022)  Tobacco Use: Unknown (06/30/2022)   SDOH  Interventions:     Readmission Risk Interventions     No data to display

## 2022-07-02 NOTE — Progress Notes (Signed)
ANTICOAGULATION CONSULT NOTE - Follow Up Consult  Pharmacy Consult for heparin Indication:  LV thrombus and PE  Labs: Recent Labs    06/30/22 1810 06/30/22 2010 07/01/22 0336 07/01/22 0337 07/01/22 0350 07/01/22 0702 07/01/22 1127 07/01/22 2114 07/02/22 0354 07/02/22 0514  HGB 11.5*  --  11.5*  --  12.2*  --   --   --  11.2*  --   HCT 35.9*  --  34.8*  --  36.0*  --   --   --  36.2*  --   PLT 239  --  218  --   --   --   --   --  198  --   APTT  --   --  38*  --   --   --   --   --   --   --   LABPROT  --   --   --  23.1*  --   --   --   --   --   --   INR  --   --   --  2.0*  --   --   --   --   --   --   HEPARINUNFRC  --   --   --   --   --   --  <0.10* 0.19*  --  0.14*  CREATININE 1.21  --   --  1.06  --  1.05  --   --  1.16  --   TROPONINIHS 72* 72*  --   --   --  63*  --   --   --   --     Assessment: 51yo male subtherapeutic on heparin with lower level despite increased rate; no infusion issues or signs of bleeding per RN.  Goal of Therapy:  Heparin level 0.3-0.7 units/ml   Plan:  Increase heparin infusion by 3 units/kg/hr to 2200 units/hr. Check level in 6 hours.   Vernard Gambles, PharmD, BCPS 07/02/2022 6:26 AM

## 2022-07-02 NOTE — Progress Notes (Signed)
NAME:  Jerry Saunders, MRN:  [id number], DOB:  17-Dec-1970, LOS: 1 ADMISSION DATE:  06/30/2022, CONSULTATION DATE:  06/30/22 REFERRING MD:  Lynelle Doctor - EM, CHIEF COMPLAINT:  SOB   History of Present Illness:  52 yo M PMH NICM EF <15% followed by [facility] and locally by [place] [facility], Hx DVT noncompliant w DOAC, LV thrombus, DM2, Severe pHTN mPAP 52 in June 2023 WHO group II, who presented to ED 6/22 with SOB and fatigue x a few weeks, some associated leg swelling. Worse sx 6/22 prompting ED presentation. In ED CXR suggestive of pulm edema, with hx of dvt DOAC noncompliace tachycardia- was sent for CTA chest which revealed RLL proximal segmental PE -- read as minimal clot burden and no evidence of R heart strain.  Received lasix in the ED. Became more tachycardic and hypotensive in ED and was started on levophed, prompting PCCM consultation . Also hypoxemic needing [place] o2 (refused BiPAP). Per cards Dr ToliaJune 2023 MRI Cardiology with severe low flow state LVEDV 444 mL and 27% EF for both LV and RV. LV very dilated and thus RV/LV ratio on CT will be inaccurate.  Admitted to [facility] 4/14-4/18/24 with decomp HFrEF + DVT   N.b.  - CTA also w 9mm pulmonary nodule on R -- rec CT chest in 3-40mo  - per Dr Odis Hollingshead - has missed many cardiology followup appointments  Pertinent  Medical History  NICM Biventricular systolic congestive heart failure Severe pHTN group II  DM2 Obesity  DVT    has a past medical history of Cardiomyopathy (HCC), CHF (congestive heart failure) (HCC), Diabetes mellitus without complication (HCC), DVT (deep venous thrombosis) (HCC), Hyperlipidemia, and Hypertension.   has a past surgical history that includes RIGHT/LEFT HEART CATH AND CORONARY ANGIOGRAPHY (N/A, 06/20/2021).   Significant Hospital Events: Including procedures, antibiotic start and stop dates in addition to other pertinent events   07/01/22 to ED w tachycardia, SOB. Found to have PE -- is small but possibly hemodynamically  significant when taking into account his severe underlying chronic dz -- severe pHTN, NICM EF <15%   . [facility]  Interim History / Subjective:  Patient remained in sinus rhythm on amiodarone infusion Stated feeling better, made 2.7 L of urine, with a net -300 for last 24 hours On milrinone and Levophed Coox 62%  Objective   Blood pressure 91/75, pulse 98, temperature 98.6 F (37 C), temperature source Oral, resp. rate (!) 35, height 6\' 2"  (1.88 m), weight 124 kg, SpO2 96 %. CVP:  [10 mmHg] 10 mmHg  FiO2 (%):  [55 %] 55 %   Intake/Output Summary (Last 24 hours) at 07/02/2022 0756 Last data filed at 07/02/2022 0700 Gross per 24 hour  Intake 2349.34 ml  Output 2650 ml  Net -300.66 ml   Filed Weights   06/30/22 1806 07/01/22 0200 07/02/22 0500  Weight: 133.4 kg 124.8 kg 124 kg    Examination: Physical exam: General: Middle-age male, sitting on recliner HEENT: [place]/AT, eyes anicteric.  moist mucus membranes Neuro: Alert, awake following commands Chest: Coarse breath sounds, no wheezes or rhonchi Heart: Regular rate and rhythm, systolic murmur is present Abdomen: Soft, nontender, nondistended, bowel sounds present Skin: No rash  Labs and images were reviewed  Assessment & Plan:  Acute hypoxic respiratory failure Obstructive sleep apnea, noncompliant with CPAP Acute right lower lobe PE s/p systemic tPA History of LV thrombus, noncompliant with anticoagulation History of DVT, noncompliance with anticoagulation Paroxysmal A-fib/flutter with RVR, converted  to sinus rhythm Acute on chronic biventricular systolic/diastolic CHF Demand cardiac ischemia Severe pulmonary hypertension Cardiogenic shock Hypervolemic hyponatremia Anemia of chronic disease Obesity Diabetes type 2  Continue nasal cannula oxygen, titrate with O2 sat goal 92% Patient refused BiPAP overnight, counseling provided to use nasal probe BiPAP, stated he will try tonight Continue IV heparin  infusion Repeat echocardiogram showed EF less than 20% with grade 3 diastolic dysfunction also RV hypokinesis and pulmonary hypertension Doppler of lower extremity showed age-indeterminate DVT of the left gastrocnemius vein Remains sinus rhythm post amiodarone bolus Continue amiodarone infusion Coox is 62%, on milrinone at 0.125, still requiring Levophed with map goal 65 Made 2.6 L of urine, remains net negative Monitor intake and output Advanced heart failure team is following Serum sodium remained at 133 Closely monitor electrolytes Monitor H&H Monitor fingerstick goal 140-180 Counseling provided regarding diet and exercise  Best Practice (right click and "Reselect all SmartList Selections" daily)   Diet/type: Regular consistency DVT prophylaxis: Systemic heparin GI prophylaxis: PPI Lines: N/A -  Foley:  N/A Code Status:  full code Last date of multidisciplinary goals of care discussion [07/01/22 - wife and patient updated   The patient is critically ill due to cardiogenic shock/acute respiratory failure/acute PE.  Critical care was necessary to treat or prevent imminent or life-threatening deterioration.  Critical care was time spent personally by me on the following activities: development of treatment plan with patient and/or surrogate as well as nursing, discussions with consultants, evaluation of patient's response to treatment, examination of patient, obtaining history from patient or surrogate, ordering and performing treatments and interventions, ordering and review of laboratory studies, ordering and review of radiographic studies, pulse oximetry, re-evaluation of patient's condition and participation in multidisciplinary rounds.   During this encounter critical care time was devoted to patient care services described in this note for 39 minutes.     Cheri Fowler, MD [facility] See Amion for pager If no response to pager, please call [phone] until  7pm After 7pm, Please call E-link [phone]   LABS    PULMONARY Recent Labs  Lab 07/01/22 0350 07/01/22 1612 07/01/22 1933 07/02/22 0512 07/02/22 0615  PHART 7.419  --   --   --   --   PCO2ART 35.5  --   --   --   --   PO2ART 97  --   --   --   --   HCO3 23.0  --   --   --   --   TCO2 24  --   --   --   --   O2SAT 98 57.8 74 58.8 62.3    CBC Recent Labs  Lab 06/30/22 1810 07/01/22 0336 07/01/22 0350 07/02/22 0354  HGB 11.5* 11.5* 12.2* 11.2*  HCT 35.9* 34.8* 36.0* 36.2*  WBC 9.4 9.9  --  10.8*  PLT 239 218  --  198    COAGULATION Recent Labs  Lab 07/01/22 0337  INR 2.0*    CARDIAC  No results for input(s): "TROPONINI" in the last 168 hours. No results for input(s): "PROBNP" in the last 168 hours.   CHEMISTRY Recent Labs  Lab 06/30/22 1810 06/30/22 2010 07/01/22 0336 07/01/22 0337 07/01/22 0350 07/01/22 0702 07/02/22 0354  NA 133*  --   --  132* 135 135 133*  K 3.6  --   --  3.6 3.5 3.7 4.0  CL 98  --   --  99  --  97* 99  CO2  24  --   --  22  --  23 24  GLUCOSE 139*  --   --  145*  --  125* 139*  BUN 31*  --   --  27*  --  26* 28*  CREATININE 1.21  --   --  1.06  --  1.05 1.16  CALCIUM 8.0*  --   --  7.8*  --  7.8* 7.7*  MG  --  2.4 2.3  --   --  2.1 2.2  PHOS  --   --  3.9  --   --  3.6 3.4   Estimated Creatinine Clearance: 106.6 mL/min (by C-G formula based on SCr of 1.16 mg/dL).   LIVER Recent Labs  Lab 07/01/22 0337  AST 192*  ALT 448*  ALKPHOS 79  BILITOT 3.1*  PROT 6.6  ALBUMIN 3.0*  INR 2.0*     INFECTIOUS Recent Labs  Lab 06/30/22 1811 07/01/22 0336 07/01/22 0702  LATICACIDVEN 1.8 1.8 1.7     ENDOCRINE CBG (last 3)  Recent Labs    07/01/22 2334 07/02/22 0024 07/02/22 0359  GLUCAP 159* 184* 131*         IMAGING x48h  - image(s) personally visualized  -   highlighted in bold DG CHEST PORT 1 VIEW  Result Date: 07/01/2022 CLINICAL DATA:  Catheter placement. EXAM: PORTABLE CHEST 1 VIEW COMPARISON:   June 30, 2022. FINDINGS: Stable cardiomegaly. Interval placement of left subclavian catheter with distal tip in expected position of the SVC. No pneumothorax is noted. Central pulmonary vascular congestion is again noted. Possible bilateral pulmonary edema is noted. Bony thorax is unremarkable. IMPRESSION: Interval placement of left subclavian catheter with distal tip in expected position of the SVC. No pneumothorax. Electronically Signed   By: Lupita Raider M.D.   On: 07/01/2022 16:05   ECHOCARDIOGRAM COMPLETE  Result Date: 07/01/2022    ECHOCARDIOGRAM REPORT   Patient Name:   Jerry Saunders Date of Exam: 07/01/2022 Medical Rec #:  [id number]     Height:       74.0 in Accession #:    [id number]    Weight:       275.1 lb Date of Birth:  1970-02-24     BSA:          2.489 m Patient Age:    51 years      BP:           100/79 mmHg Patient Gender: M             HR:           100 bpm. Exam Location:  Inpatient Procedure: 2D Echo, Color Doppler, Cardiac Doppler and Intracardiac            Opacification Agent Indications:     I50.9* Heart failure (unspecified); I26.02 Pulmonary embolus  History:         Patient has prior history of Echocardiogram examinations, most                  recent 06/19/2021. CHF, Pulmonary HTN, Arrythmias:Atrial                  Fibrillation; Risk Factors:Hypertension, Diabetes and                  Dyslipidemia.  Sonographer:     Irving Burton Senior RDCS Referring Phys:  [id number] Tessa Lerner Diagnosing Phys: Tessa Lerner DO IMPRESSIONS  1. Left ventricular thrombus 2.7 x 1.6cm (largest dimension).  Left ventricular ejection fraction, by estimation, is <20%. The left ventricle has severely decreased function. The left ventricle demonstrates global hypokinesis. The left ventricular internal cavity size was severely dilated. Left ventricular diastolic parameters are consistent with Grade III diastolic dysfunction (restrictive). There is the interventricular septum is flattened in systole and diastole,  consistent with right ventricular pressure and volume overload.  2. Degree of pulmonary hypertension is underestimated due to dialted RA and RV size. Right ventricular systolic function is severely reduced. The right ventricular size is severely enlarged. There is moderately elevated pulmonary artery systolic pressure. The estimated right ventricular systolic pressure is 49.3 mmHg.  3. Left atrial size was severely dilated.  4. Right atrial size was severely dilated.  5. The mitral valve is degenerative. Moderate to severe mitral valve regurgitation. No evidence of mitral stenosis.  6. Tricuspid valve regurgitation is moderate to severe.  7. The aortic valve is tricuspid. Aortic valve regurgitation is not visualized. No aortic stenosis is present.  8. The inferior vena cava is dilated in size with <50% respiratory variability, suggesting right atrial pressure of 15 mmHg. Comparison(s): Prior echo 06/19/2021: LVEF <20%, global HK, LV dilated, RV dilated and reduced function, RAP . Conclusion(s)/Recommendation(s): Findings consistent with Dilated biventricular heart failure. Left ventricular thrombus present as noted above. FINDINGS  Left Ventricle: Left ventricular thrombus 2.7 x 1.6cm (largest dimension). Left ventricular ejection fraction, by estimation, is <20%. The left ventricle has severely decreased function. The left ventricle demonstrates global hypokinesis. Definity contrast agent was given IV to delineate the left ventricular endocardial borders. The left ventricular internal cavity size was severely dilated. There is no left ventricular hypertrophy. The interventricular septum is flattened in systole and diastole,  consistent with right ventricular pressure and volume overload. Left ventricular diastolic parameters are consistent with Grade III diastolic dysfunction (restrictive). Right Ventricle: Degree of pulmonary hypertension is underestimated due to dialted RA and RV size. The right ventricular  size is severely enlarged. Right vetricular wall thickness was not well visualized. Right ventricular systolic function is severely reduced. There is moderately elevated pulmonary artery systolic pressure. The tricuspid regurgitant velocity is 2.93 m/s, and with an assumed right atrial pressure of 15 mmHg, the estimated right ventricular systolic pressure is 49.3 mmHg. Left Atrium: Left atrial size was severely dilated. Right Atrium: Right atrial size was severely dilated. Pericardium: There is no evidence of pericardial effusion. Mitral Valve: The mitral valve is degenerative in appearance. There is mild thickening of the mitral valve leaflet(s). Moderate to severe mitral valve regurgitation, with centrally-directed jet. No evidence of mitral valve stenosis. Tricuspid Valve: The tricuspid valve is grossly normal. Tricuspid valve regurgitation is moderate to severe. No evidence of tricuspid stenosis. Aortic Valve: The aortic valve is tricuspid. Aortic valve regurgitation is not visualized. No aortic stenosis is present. Pulmonic Valve: The pulmonic valve was grossly normal. Pulmonic valve regurgitation is mild. No evidence of pulmonic stenosis. Aorta: The aortic root and ascending aorta are structurally normal, with no evidence of dilitation. Venous: The inferior vena cava is dilated in size with less than 50% respiratory variability, suggesting right atrial pressure of 15 mmHg. IAS/Shunts: The interatrial septum was not well visualized.  LEFT VENTRICLE PLAX 2D LVIDd:         6.90 cm      Diastology LVIDs:         6.70 cm      LV e' medial:    4.68 cm/s LV PW:         0.90  cm      LV E/e' medial:  17.3 LV IVS:        0.80 cm      LV e' lateral:   9.57 cm/s LVOT diam:     1.80 cm      LV E/e' lateral: 8.5 LV SV:         26 LV SV Index:   10 LVOT Area:     2.54 cm  LV Volumes (MOD) LV vol d, MOD A2C: 232.0 ml LV vol d, MOD A4C: 281.0 ml LV vol s, MOD A2C: 218.0 ml LV vol s, MOD A4C: 212.0 ml LV SV MOD A2C:     14.0  ml LV SV MOD A4C:     281.0 ml LV SV MOD BP:      49.6 ml RIGHT VENTRICLE RV S prime:     12.10 cm/s TAPSE (M-mode): 1.3 cm LEFT ATRIUM              Index        RIGHT ATRIUM           Index LA diam:        5.20 cm  2.09 cm/m   RA Area:     35.00 cm LA Vol (A2C):   169.0 ml 67.90 ml/m  RA Volume:   135.00 ml 54.24 ml/m LA Vol (A4C):   133.0 ml 53.44 ml/m LA Biplane Vol: 166.0 ml 66.69 ml/m  AORTIC VALVE LVOT Vmax:   73.40 cm/s LVOT Vmean:  56.200 cm/s LVOT VTI:    0.101 m  AORTA Ao Root diam: 3.10 cm Ao Asc diam:  3.10 cm MITRAL VALVE                  TRICUSPID VALVE MV Area (PHT): 3.60 cm       TR Peak grad:   34.3 mmHg MV Decel Time: 211 msec       TR Vmax:        293.00 cm/s MR Peak grad:    50.1 mmHg MR Mean grad:    35.0 mmHg    SHUNTS MR Vmax:         354.00 cm/s  Systemic VTI:  0.10 m MR Vmean:        278.0 cm/s   Systemic Diam: 1.80 cm MR PISA:         2.26 cm MR PISA Eff ROA: 25 mm MR PISA Radius:  0.60 cm MV E velocity: 80.90 cm/s Sunit Tolia DO Electronically signed by Tessa Lerner DO Signature Date/Time: 07/01/2022/11:12:36 AM    Final    CT Angio Chest PE W and/or Wo Contrast  Result Date: 06/30/2022 CLINICAL DATA:  Cough, short of breath, febrile, suspected pulmonary embolus EXAM: CT ANGIOGRAPHY CHEST WITH CONTRAST TECHNIQUE: Multidetector CT imaging of the chest was performed using the standard protocol during bolus administration of intravenous contrast. Multiplanar CT image reconstructions and MIPs were obtained to evaluate the vascular anatomy. RADIATION DOSE REDUCTION: This exam was performed according to the departmental dose-optimization program which includes automated exposure control, adjustment of the mA and/or kV according to patient size and/or use of iterative reconstruction technique. CONTRAST:  75mL OMNIPAQUE IOHEXOL 350 MG/ML SOLN COMPARISON:  06/30/2022, 06/18/2021 FINDINGS: Cardiovascular: This is a technically adequate evaluation of the pulmonary vasculature. There is a  proximal segmental pulmonary embolus within the right lower lobe. Minimal clot burden. No evidence of right heart strain, with chronic dilatation of the cardiac chambers again noted. Continued cardiomegaly  without pericardial effusion. Normal caliber of the thoracic aorta. Mediastinum/Nodes: No enlarged mediastinal, hilar, or axillary lymph nodes. Thyroid gland, trachea, and esophagus demonstrate no significant findings. Lungs/Pleura: No acute airspace disease, effusion, or pneumothorax. Scattered hypoventilatory changes are seen at the lung bases. Sub solid 9 mm subpleural right lower lobe pulmonary nodule reference image 100/7 is noted, new since prior exam. Upper Abdomen: No acute abnormality. Musculoskeletal: No acute or destructive bony abnormalities. Reconstructed images demonstrate no additional findings. Review of the MIP images confirms the above findings. IMPRESSION: 1. Right lower lobe proximal segmental pulmonary embolus. Minimal clot burden with no evidence of right heart strain. 2. Stable cardiomegaly, with marked dilatation of the cardiac chambers again noted. No pericardial effusion. 3. Right part-solid pulmonary nodule measuring 9 mm. Per Fleischner Society Guidelines, recommend a non-contrast Chest CT at 3-6 months to confirm persistence. If unchanged and the solid component remains < 6 mm, an annual non-contrast Chest CT should be performed for 5 years. If the solid component is 6-8 mm on follow-up, recommend biopsy or resection. If the solid component is > 8 mm on follow-up, recommend PET/CT, biopsy or resection. These guidelines do not apply to immunocompromised patients and patients with cancer. Follow up in patients with significant comorbidities as clinically warranted. For lung cancer screening, adhere to Lung-RADS guidelines. Reference: Radiology. 2017; 284(1):228-43. Critical Value/emergent results were called by telephone at the time of interpretation on 06/30/2022 at 10:30 pm to provider  Erlanger Bledsoe , who verbally acknowledged these results. Electronically Signed   By: Sharlet Salina M.D.   On: 06/30/2022 22:31   DG Chest 2 View  Result Date: 06/30/2022 CLINICAL DATA:  Elevated temperature, cough and shortness of breath. EXAM: CHEST - 2 VIEW COMPARISON:  Chest radiograph dated June 18, 2021 FINDINGS: The heart is enlarged. Pulmonary vascular congestion and bilateral perihilar interstitial opacities suggesting mild pulmonary edema. No appreciable pleural effusion. IMPRESSION: Cardiomegaly with pulmonary vascular congestion and mild pulmonary edema. Electronically Signed   By: Larose Hires D.O.   On: 06/30/2022 18:50

## 2022-07-02 NOTE — Progress Notes (Signed)
Progress Note  Patient Name: Jerry Saunders MRN: [id number] DOB: 27-Aug-1970 Date of Encounter: 07/02/2022  Attending physician: Cheri Fowler, MD Primary care provider: Aviva Kluver Primary Cardiologist: Tessa Lerner, DO, [facility]  Subjective: Lowell Makara is a 52 y.o. African-American male who was seen and examined at bedside  Up in chair eating breakfast Wife at bedside  Shortness of breath improving Case discussed and reviewed with his nurse.  6/22: ED presentation, dx w/ PE, levo started 6/23: s/ptPA, new onset of AFL, started on Amio gtt, subclavian line, milrinone started, IV lasix, echo 6/24: on IV heparin, Amio, milrinone, levo , LE venous duplex negative  Objective: Vital Signs in the last 24 hours: Temp:  [98 F (36.7 C)-98.8 F (37.1 C)] 98.8 F (37.1 C) (06/24 2030) Pulse Rate:  [31-172] 86 (06/24 2300) Resp:  [5-37] 19 (06/24 2300) BP: (83-123)/(55-104) 97/70 (06/24 2300) SpO2:  [76 %-100 %] 97 % (06/24 2300) FiO2 (%):  [55 %] 55 % (06/24 1950) Weight:  [657 kg] 124 kg (06/24 0500)  Intake/Output:  Intake/Output Summary (Last 24 hours) at 07/02/2022 2350 Last data filed at 07/02/2022 2217 Gross per 24 hour  Intake 2103.65 ml  Output 1800 ml  Net 303.65 ml    Net IO Since Admission: 690.89 mL [07/02/22 2350]  Weights:     07/02/2022    5:00 AM 07/01/2022    2:00 AM 06/30/2022    6:06 PM  Last 3 Weights  Weight (lbs) 273 lb 5.9 oz 275 lb 2.2 oz 294 lb 1.5 oz  Weight (kg) 124 kg 124.8 kg 133.4 kg      Telemetry:  Overnight telemetry shows SR/ST w/ PVCs and NSVT (longest 10 beats), which I personally reviewed.   Physical examination: PHYSICAL EXAM: Vitals:   07/02/22 2100 07/02/22 2200 07/02/22 2210 07/02/22 2300  BP: (!) 83/64 98/75  97/70  Pulse: 74 91 93 86  Resp: 19 (!) 28 (!) 26 19  Temp:      TempSrc:      SpO2: 96% 95% (!) 81% 97%  Weight:      Height:        Physical Exam  Constitutional: He appears acutely ill.  Appears older than stated  age, on [place] oxygen.    HENT:  Dry mucus membrane   Neck: JVD present.  Cardiovascular: Regular rhythm, S1 normal, S2 normal, intact distal pulses and normal pulses. Tachycardia present. Exam reveals gallop and S3. Exam reveals no S4.  Murmur heard. Holosystolic murmur is present with a grade of 3/6 at the apex. Pulmonary/Chest: Effort normal. No stridor. He has no wheezes. He has bibasilar rales.  Left subclavian line.   Abdominal: Soft. Bowel sounds are normal. He exhibits no distension. There is no abdominal tenderness.  Musculoskeletal:        General: Edema present.     Cervical back: Neck supple.     Comments: Cool to touch (bilateral legs).   Neurological: He is alert and oriented to person, place, and time. He has intact cranial nerves (2-12).  Skin: Skin is warm and moist.   Lab Results: Chemistry Recent Labs  Lab 07/01/22 0337 07/01/22 0350 07/01/22 0702 07/02/22 0354  NA 132* 135 135 133*  K 3.6 3.5 3.7 4.0  CL 99  --  97* 99  CO2 22  --  23 24  GLUCOSE 145*  --  125* 139*  BUN 27*  --  26* 28*  CREATININE 1.06  --  1.05 1.16  CALCIUM 7.8*  --  7.8* 7.7*  PROT 6.6  --   --  6.4*  ALBUMIN 3.0*  --   --  2.8*  AST 192*  --   --  781*  ALT 448*  --   --  938*  ALKPHOS 79  --   --  73  BILITOT 3.1*  --   --  2.0*  GFRNONAA >60  --  >60 >60  ANIONGAP 11  --  15 10    Hematology Recent Labs  Lab 06/30/22 1810 07/01/22 0336 07/01/22 0350 07/02/22 0354  WBC 9.4 9.9  --  10.8*  RBC 4.20* 4.10*  --  4.14*  HGB 11.5* 11.5* 12.2* 11.2*  HCT 35.9* 34.8* 36.0* 36.2*  MCV 85.5 84.9  --  87.4  MCH 27.4 28.0  --  27.1  MCHC 32.0 33.0  --  30.9  RDW 15.1 15.3  --  15.1  PLT 239 218  --  198   High Sensitivity Troponin:   Recent Labs  Lab 06/30/22 1810 06/30/22 2010 07/01/22 0702  TROPONINIHS 72* 72* 63*     Cardiac EnzymesNo results for input(s): "TROPONINI" in the last 168 hours. No results for input(s): "TROPIPOC" in the last 168 hours.  BNP Recent Labs   Lab 06/30/22 1810 07/01/22 0337  BNP 2,206.2* 1,884.1*    DDimer No results for input(s): "DDIMER" in the last 168 hours.  Hemoglobin A1c:  Lab Results  Component Value Date   HGBA1C 7.0 (H) 01/19/2022   MPG 177.16 06/19/2021   TSH No results for input(s): "TSH" in the last 8760 hours. Lipid Panel  Lab Results  Component Value Date   CHOL 116 01/19/2022   HDL 44 01/19/2022   LDLCALC 58 01/19/2022   LDLDIRECT 60 01/19/2022   TRIG 64 01/19/2022   CHOLHDL 3.5 06/21/2021   Drugs of Abuse     Component Value Date/Time   LABOPIA NONE DETECTED 06/20/2021 1835   COCAINSCRNUR NONE DETECTED 06/20/2021 1835   LABBENZ NONE DETECTED 06/20/2021 1835   AMPHETMU NONE DETECTED 06/20/2021 1835   THCU NONE DETECTED 06/20/2021 1835   LABBARB NONE DETECTED 06/20/2021 1835      Imaging: VAS [place] LOWER EXTREMITY VENOUS (DVT)  Result Date: 07/02/2022  Lower Venous DVT Study Patient Name:  ESAUL DORWART  Date of Exam:   07/02/2022 Medical Rec #: [id number]      Accession #:    [id number] Date of Birth: 1970-03-12      Patient Gender: M Patient Age:   13 years Exam Location:  [facility] Procedure:      VAS [place] LOWER EXTREMITY VENOUS (DVT) Referring Phys: GRACE BOWSER --------------------------------------------------------------------------------  Indications: Swelling.  Comparison Study: Previous 06/19/21 age indeterminate in left gastroc Performing Technologist: McKayla Maag RVT, VT  Examination Guidelines: A complete evaluation includes B-mode imaging, spectral Doppler, color Doppler, and power Doppler as needed of all accessible portions of each vessel. Bilateral testing is considered an integral part of a complete examination. Limited examinations for reoccurring indications may be performed as noted. The reflux portion of the exam is performed with the patient in reverse Trendelenburg.  +--------+---------------+---------+-----------+----------+--------------------+ RIGHT    CompressibilityPhasicitySpontaneityPropertiesThrombus Aging       +--------+---------------+---------+-----------+----------+--------------------+ CFV     Full           Yes      Yes                                       +--------+---------------+---------+-----------+----------+--------------------+  SFJ     Full                                                              +--------+---------------+---------+-----------+----------+--------------------+ FV Prox Full                                                              +--------+---------------+---------+-----------+----------+--------------------+ FV Mid  Full                                                              +--------+---------------+---------+-----------+----------+--------------------+ FV                     Yes      Yes                  Patent by color      Distal                                               doppler              +--------+---------------+---------+-----------+----------+--------------------+ PFV     Full                                                              +--------+---------------+---------+-----------+----------+--------------------+ POP     Full           Yes      Yes                                       +--------+---------------+---------+-----------+----------+--------------------+ PTV     Full                                                              +--------+---------------+---------+-----------+----------+--------------------+ PERO    Full                                                              +--------+---------------+---------+-----------+----------+--------------------+   +--------+---------------+---------+-----------+----------+--------------------+ LEFT    CompressibilityPhasicitySpontaneityPropertiesThrombus Aging       +--------+---------------+---------+-----------+----------+--------------------+ CFV  Full           Yes      Yes                                       +--------+---------------+---------+-----------+----------+--------------------+ SFJ     Full                                                              +--------+---------------+---------+-----------+----------+--------------------+ FV Prox Full                                                              +--------+---------------+---------+-----------+----------+--------------------+ FV Mid  Full                                                              +--------+---------------+---------+-----------+----------+--------------------+ FV                     Yes      Yes                  Patent by color      Distal                                               doppler              +--------+---------------+---------+-----------+----------+--------------------+ PFV     Full                                                              +--------+---------------+---------+-----------+----------+--------------------+ POP     Full           Yes      Yes                                       +--------+---------------+---------+-----------+----------+--------------------+ PTV     Full                                                              +--------+---------------+---------+-----------+----------+--------------------+ PERO    Full                                                              +--------+---------------+---------+-----------+----------+--------------------+  Summary: RIGHT: - There is no evidence of deep vein thrombosis in the lower extremity. However, portions of this examination were limited- see technologist comments above.  - No cystic structure found in the popliteal fossa.  LEFT: - There is no evidence of deep vein thrombosis in the lower extremity. However, portions of this examination were limited- see technologist comments above.  - No cystic structure  found in the popliteal fossa.  *See table(s) above for measurements and observations. Electronically signed by Gerarda Fraction on 07/02/2022 at 6:00:05 PM.    Final    DG CHEST PORT 1 VIEW  Result Date: 07/01/2022 CLINICAL DATA:  Catheter placement. EXAM: PORTABLE CHEST 1 VIEW COMPARISON:  June 30, 2022. FINDINGS: Stable cardiomegaly. Interval placement of left subclavian catheter with distal tip in expected position of the SVC. No pneumothorax is noted. Central pulmonary vascular congestion is again noted. Possible bilateral pulmonary edema is noted. Bony thorax is unremarkable. IMPRESSION: Interval placement of left subclavian catheter with distal tip in expected position of the SVC. No pneumothorax. Electronically Signed   By: Lupita Raider M.D.   On: 07/01/2022 16:05   ECHOCARDIOGRAM COMPLETE  Result Date: 07/01/2022    ECHOCARDIOGRAM REPORT   Patient Name:   KHYE HOCHSTETLER Date of Exam: 07/01/2022 Medical Rec #:  [id number]     Height:       74.0 in Accession #:    [id number]    Weight:       275.1 lb Date of Birth:  06/02/1970     BSA:          2.489 m Patient Age:    51 years      BP:           100/79 mmHg Patient Gender: M             HR:           100 bpm. Exam Location:  Inpatient Procedure: 2D Echo, Color Doppler, Cardiac Doppler and Intracardiac            Opacification Agent Indications:     I50.9* Heart failure (unspecified); I26.02 Pulmonary embolus  History:         Patient has prior history of Echocardiogram examinations, most                  recent 06/19/2021. CHF, Pulmonary HTN, Arrythmias:Atrial                  Fibrillation; Risk Factors:Hypertension, Diabetes and                  Dyslipidemia.  Sonographer:     Irving Burton Senior RDCS Referring Phys:  [id number] Tessa Lerner Diagnosing Phys: Tessa Lerner DO IMPRESSIONS  1. Left ventricular thrombus 2.7 x 1.6cm (largest dimension). Left ventricular ejection fraction, by estimation, is <20%. The left ventricle has severely decreased function. The left  ventricle demonstrates global hypokinesis. The left ventricular internal cavity size was severely dilated. Left ventricular diastolic parameters are consistent with Grade III diastolic dysfunction (restrictive). There is the interventricular septum is flattened in systole and diastole, consistent with right ventricular pressure and volume overload.  2. Degree of pulmonary hypertension is underestimated due to dialted RA and RV size. Right ventricular systolic function is severely reduced. The right ventricular size is severely enlarged. There is moderately elevated pulmonary artery systolic pressure. The estimated right ventricular systolic pressure is 49.3 mmHg.  3. Left atrial size was severely dilated.  4. Right atrial size  was severely dilated.  5. The mitral valve is degenerative. Moderate to severe mitral valve regurgitation. No evidence of mitral stenosis.  6. Tricuspid valve regurgitation is moderate to severe.  7. The aortic valve is tricuspid. Aortic valve regurgitation is not visualized. No aortic stenosis is present.  8. The inferior vena cava is dilated in size with <50% respiratory variability, suggesting right atrial pressure of 15 mmHg. Comparison(s): Prior echo 06/19/2021: LVEF <20%, global HK, LV dilated, RV dilated and reduced function, RAP . Conclusion(s)/Recommendation(s): Findings consistent with Dilated biventricular heart failure. Left ventricular thrombus present as noted above. FINDINGS  Left Ventricle: Left ventricular thrombus 2.7 x 1.6cm (largest dimension). Left ventricular ejection fraction, by estimation, is <20%. The left ventricle has severely decreased function. The left ventricle demonstrates global hypokinesis. Definity contrast agent was given IV to delineate the left ventricular endocardial borders. The left ventricular internal cavity size was severely dilated. There is no left ventricular hypertrophy. The interventricular septum is flattened in systole and diastole,   consistent with right ventricular pressure and volume overload. Left ventricular diastolic parameters are consistent with Grade III diastolic dysfunction (restrictive). Right Ventricle: Degree of pulmonary hypertension is underestimated due to dialted RA and RV size. The right ventricular size is severely enlarged. Right vetricular wall thickness was not well visualized. Right ventricular systolic function is severely reduced. There is moderately elevated pulmonary artery systolic pressure. The tricuspid regurgitant velocity is 2.93 m/s, and with an assumed right atrial pressure of 15 mmHg, the estimated right ventricular systolic pressure is 49.3 mmHg. Left Atrium: Left atrial size was severely dilated. Right Atrium: Right atrial size was severely dilated. Pericardium: There is no evidence of pericardial effusion. Mitral Valve: The mitral valve is degenerative in appearance. There is mild thickening of the mitral valve leaflet(s). Moderate to severe mitral valve regurgitation, with centrally-directed jet. No evidence of mitral valve stenosis. Tricuspid Valve: The tricuspid valve is grossly normal. Tricuspid valve regurgitation is moderate to severe. No evidence of tricuspid stenosis. Aortic Valve: The aortic valve is tricuspid. Aortic valve regurgitation is not visualized. No aortic stenosis is present. Pulmonic Valve: The pulmonic valve was grossly normal. Pulmonic valve regurgitation is mild. No evidence of pulmonic stenosis. Aorta: The aortic root and ascending aorta are structurally normal, with no evidence of dilitation. Venous: The inferior vena cava is dilated in size with less than 50% respiratory variability, suggesting right atrial pressure of 15 mmHg. IAS/Shunts: The interatrial septum was not well visualized.  LEFT VENTRICLE PLAX 2D LVIDd:         6.90 cm      Diastology LVIDs:         6.70 cm      LV e' medial:    4.68 cm/s LV PW:         0.90 cm      LV E/e' medial:  17.3 LV IVS:        0.80 cm       LV e' lateral:   9.57 cm/s LVOT diam:     1.80 cm      LV E/e' lateral: 8.5 LV SV:         26 LV SV Index:   10 LVOT Area:     2.54 cm  LV Volumes (MOD) LV vol d, MOD A2C: 232.0 ml LV vol d, MOD A4C: 281.0 ml LV vol s, MOD A2C: 218.0 ml LV vol s, MOD A4C: 212.0 ml LV SV MOD A2C:     14.0 ml LV SV  MOD A4C:     281.0 ml LV SV MOD BP:      49.6 ml RIGHT VENTRICLE RV S prime:     12.10 cm/s TAPSE (M-mode): 1.3 cm LEFT ATRIUM              Index        RIGHT ATRIUM           Index LA diam:        5.20 cm  2.09 cm/m   RA Area:     35.00 cm LA Vol (A2C):   169.0 ml 67.90 ml/m  RA Volume:   135.00 ml 54.24 ml/m LA Vol (A4C):   133.0 ml 53.44 ml/m LA Biplane Vol: 166.0 ml 66.69 ml/m  AORTIC VALVE LVOT Vmax:   73.40 cm/s LVOT Vmean:  56.200 cm/s LVOT VTI:    0.101 m  AORTA Ao Root diam: 3.10 cm Ao Asc diam:  3.10 cm MITRAL VALVE                  TRICUSPID VALVE MV Area (PHT): 3.60 cm       TR Peak grad:   34.3 mmHg MV Decel Time: 211 msec       TR Vmax:        293.00 cm/s MR Peak grad:    50.1 mmHg MR Mean grad:    35.0 mmHg    SHUNTS MR Vmax:         354.00 cm/s  Systemic VTI:  0.10 m MR Vmean:        278.0 cm/s   Systemic Diam: 1.80 cm MR PISA:         2.26 cm MR PISA Eff ROA: 25 mm MR PISA Radius:  0.60 cm MV E velocity: 80.90 cm/s Fiorela Pelzer DO Electronically signed by Tessa Lerner DO Signature Date/Time: 07/01/2022/11:12:36 AM    Final     CARDIAC DATABASE: EKG: 06/30/2022:  @17 :59 Sinus tachycardia, 117 bpm, biatrial enlargement, right axis, RVH. @2013 : Sinus tachycardia, questionable lead reversal. @2229 : Sinus rhythm, 96 bpm, ventricular bigeminy, biatrial enlargement, RVH.  07/01/2022: Atrial flutter, 150 bpm.   Echocardiogram: 06/19/2021: LVEF <20%, severely reduced systolic function, global hypokinesis, left ventricular size is dilated, right ventricular size is dilated, systolic function reduced, biatrial enlargement, mild MR, IVC dilated, estimated RAP 15 mmHg, Definity contrast was utilized LV  thrombus not present (This is a personal read, it differs from the final report).  07/02/2022: 1. Left ventricular thrombus 2.7 x 1.6cm (largest dimension). Left ventricular ejection fraction, by estimation, is <20%. The left ventricle  has severely decreased function. The left ventricle demonstrates global hypokinesis. The left ventricular internal cavity size was severely dilated. Left ventricular diastolic  parameters are consistent with Grade III diastolic dysfunction  (restrictive). There is the interventricular septum is flattened in  systole and diastole, consistent with right  ventricular pressure and volume overload.   2. Degree of pulmonary hypertension is underestimated due to dialted RA and RV size. Right ventricular systolic function is severely reduced. The right ventricular size is severely enlarged. There is moderately elevated pulmonary artery systolic  pressure. The estimated right ventricular systolic pressure is 49.3 mmHg.   3. Left atrial size was severely dilated.   4. Right atrial size was severely dilated.   5. The mitral valve is degenerative. Moderate to severe mitral valve  regurgitation. No evidence of mitral stenosis.   6. Tricuspid valve regurgitation is moderate to severe.   7. The aortic valve is  tricuspid. Aortic valve regurgitation is not visualized. No aortic stenosis is present.   8. The inferior vena cava is dilated in size with <50% respiratory variability, suggesting right atrial pressure of 15 mmHg.   Comparison(s): Prior echo 06/19/2021: LVEF <20%, global HK, LV dilated, RV dilated and reduced function, RAP .    Stress Testing:  None   Heart Catheterization: Right dominant circulation, normal coronaries No angiographic evidence of coronary artery disease   RA: 16 mmHg RV: 69/11 mmHg, RVEDP 24 mmHg PA: 67/43 mmHg, mPAP 54 mmHg PCW: 27 mmHg   CO: 5.8 L/min CI: 2.3 L/min/m2   Patient went into SVT 3 times with any minimal irritation of LV  during left heart catheterization, requiring vagal maneuvers, adenosine 6 mg, 12 mg, with conversion to sinus rhythm   Conclusion: Decompensated nonischemic cardiomyopathy Consider dilated or arrhythmia induced cardiomyopathy  Severe pulmonary hypertension, WHO grp II   GDMT for HFrEF with continued workup for etiology evaluation   Cardiac MRI June 22, 2021: 1.  Severe LV dilatation with severe systolic dysfunction (EF 20%) LV EDV: (Normal 77-195 mL) LV ESV: (Normal 19-72 mL)   2.  Moderate RV dilatation with severe systolic dysfunction (EF 27%) RV EDV: (Normal 88-227 mL) RV ESV: (Normal 23-103 mL)    3. RV insertion site late gadolinium enhancement, which is a nonspecific finding often seen in setting of elevated pulmonary pressures. No other LGE seen   RADIOLOGY: Chest x-ray June 30, 2022: Cardiomegaly with pulmonary vascular congestion and mild pulmonary edema.   CT PE protocol June 30, 2022: 1. Right lower lobe proximal segmental pulmonary embolus. Minimal clot burden with no evidence of right heart strain. 2. Stable cardiomegaly, with marked dilatation of the cardiac chambers again noted. No pericardial effusion. 3. Right part-solid pulmonary nodule measuring 9 mm. Per Fleischner Society Guidelines, recommend a non-contrast Chest CT at 3-6 months to confirm persistence. If unchanged and the solid component remains < 6 mm, an annual non-contrast Chest CT should be performed for 5 years. If the solid component is 6-8 mm on follow-up, recommend biopsy or resection. If the solid component is > 8 mm on follow-up, recommend PET/CT, biopsy or resection. These guidelines do not apply to immunocompromised patients and patients with cancer. Follow up in patients with significant co morbidities as clinically warranted. For lung cancer screening, adhere to Lung-RADS guidelines. Reference: Radiology. 2017; 284(1):228-43.  Scheduled Meds:  Chlorhexidine Gluconate  Cloth  6 each Topical Daily   digoxin  0.125 mg Oral Daily   furosemide  80 mg Intravenous BID   insulin aspart  0-15 Units Subcutaneous Q4H    morphine injection  4 mg Intravenous Once   pantoprazole (PROTONIX) IV  40 mg Intravenous Q24H    Continuous Infusions:  sodium chloride 20 mL/hr at 07/02/22 2217   amiodarone 30 mg/hr (07/02/22 2256)   heparin 2,300 Units/hr (07/02/22 2227)   milrinone 0.125 mcg/kg/min (07/02/22 2217)   norepinephrine (LEVOPHED) Adult infusion 3 mcg/min (07/02/22 2217)    PRN Meds: docusate sodium, polyethylene glycol, traMADol   IMPRESSION & RECOMMENDATIONS: Nate Common is a 52 y.o. African-American male whose past medical history and cardiac risk factors include: Nonischemic cardiomyopathy, biventricular heart failure, severe pulmonary hypertension (WHO group 2), Acute PE (06/30/2022), LV thrombus (07/01/2022), increased PVC burden, diabetes mellitus type 2, history of DVT (left gastroc vein indeterminate DVT June 2023), noncompliance, obesity   Impression: Acute decompensated biventricular failure / cardiogenic shock Stage C, NYHA III Acute PE  Left ventricular thrombus  Acute hypoxic respiratory failure.  Severe PHTN, WHO group 2 Syncope Elevated troponin due to supply demand ischemia  NIDDM Type II  Ventricular ectopy / NSVT Obesity  Non-compliance.   Recommendation:  Acute decompensated biventricular failure / cardiogenic shock Stage C, NYHA IIIb Likely precipitated by noncompliance to medical tx, outpt follow up.  Off of HF medication for more than 1 month.  D/C last hospitalization June 2023 123 kg  On milrinone Levo gtt and IVP lasix  Co-ox 62%, CVP 20,  UOP ~2.6 L Monitor LFTs  LA normal  Continue Digoxin for now.  Escalate tx once off inotropic and based on BP / renal function.  Compression stocking bilateral - since venous duplex is negative for DVT  Greatly appreciate [facility] team's follow up / assistance.  Plan of care  discussed w/ patient and wife during morning rounds.   Acute PE:  Newly dx RLL PE  Likely secondary to poor RV function / PHTN due to low output state and Virchow's triad.  IV heparin - recommend transitioning to [facility] (will reduce daily input).  Needs age appropriate cancer screening  S/p tPA - finished infusion 06/30/22 at ~330am Venous duplex - negative.   AFL w/ RVR New onset on 07/01/2022 Started on IV amiodarone  Transition to DOAC if okay as per AHF team.   LV thrombus: Started on OAC. Monitor for now.   Patient's questions and concerns were addressed to his satisfaction. He voices understanding of the instructions provided during this encounter.   This note was created using a voice recognition software as a result there may be grammatical errors inadvertently enclosed that do not reflect the nature of this encounter. Every attempt is made to correct such errors.  Delilah Shan [facility]  Pager:  [phone] Office: [phone] 07/02/2022, 11:50 PM

## 2022-07-02 NOTE — Progress Notes (Signed)
ANTICOAGULATION CONSULT NOTE -   Pharmacy Consult for heparin Indication: pulmonary embolus/DVT/LV thrombus  No Known Allergies  Patient Measurements: Height: 6\' 2"  (188 cm) Weight: 124 kg (273 lb 5.9 oz) IBW/kg (Calculated) : 82.2 Heparin Dosing Weight: 110kg  Vital Signs: Temp: 98 F (36.7 C) (06/24 1139) Temp Source: Oral (06/24 1139) BP: 108/72 (06/24 1215) Pulse Rate: 95 (06/24 1215)  Labs: Recent Labs    06/30/22 1810 06/30/22 2010 07/01/22 0336 07/01/22 0337 07/01/22 0350 07/01/22 0702 07/01/22 1127 07/01/22 2114 07/02/22 0354 07/02/22 0514 07/02/22 1315  HGB 11.5*  --  11.5*  --  12.2*  --   --   --  11.2*  --   --   HCT 35.9*  --  34.8*  --  36.0*  --   --   --  36.2*  --   --   PLT 239  --  218  --   --   --   --   --  198  --   --   APTT  --   --  38*  --   --   --   --   --   --   --   --   LABPROT  --   --   --  23.1*  --   --   --   --   --   --   --   INR  --   --   --  2.0*  --   --   --   --   --   --   --   HEPARINUNFRC  --   --   --   --   --   --    < > 0.19*  --  0.14* 0.47  CREATININE 1.21  --   --  1.06  --  1.05  --   --  1.16  --   --   TROPONINIHS 72* 72*  --   --   --  63*  --   --   --   --   --    < > = values in this interval not displayed.     Estimated Creatinine Clearance: 106.6 mL/min (by C-G formula based on SCr of 1.16 mg/dL).   Medications:  Medications Prior to Admission  Medication Sig Dispense Refill Last Dose   apixaban (ELIQUIS) 5 MG TABS tablet Take 1 tablet (5 mg total) by mouth 2 (two) times daily. 180 tablet 3 Past Month   Artificial Tear Ointment (DRY EYES OP) Place 2 drops into both eyes 2 (two) times daily as needed (dry eyes).      losartan (COZAAR) 25 MG tablet Take 1 tablet (25 mg total) by mouth daily at 10 pm. 30 tablet 0    metoprolol succinate (TOPROL XL) 50 MG 24 hr tablet Take 1 tablet (50 mg total) by mouth every morning. Hold if systolic blood pressure (top number) less than 100 mmHg or pulse less than  60 bpm. 90 tablet 0    sacubitril-valsartan (ENTRESTO) 49-51 MG Take 1 tablet by mouth 2 (two) times daily. 60 tablet 3    spironolactone (ALDACTONE) 25 MG tablet Take 1 tablet (25 mg total) by mouth in the morning. 90 tablet 0    torsemide (DEMADEX) 10 MG tablet Take 1 tablet (10 mg total) by mouth every morning. 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 Clot burden was low,  however TPA 100mg  iv x1 was administered due to hemodynamic compromise. Heparin was resumed 6/23 AM. Patient prescribed Eliquis PTA for DVT,  but was not taking medications.   Heparin level on 2200 units/hr of IV heparin is at goal at 0.47. No bleeding or infusion issues per RN. Fibrinogen post TPA stable 142    Goal of Therapy:  Heparin level 0.3-0.5 units/ml until 6/24 0300 (~24h after tPA) then 0.3-0.7 units/ml Monitor platelets by anticoagulation protocol: Yes   Plan:  Continue IV heparin at 2200 units/hr. Repeat heparin level in 6 hrs to confirm. Daily heparin level and CBC.  Reece Leader, Colon Flattery, BCCP Clinical Pharmacist  07/02/2022 2:44 PM   [facility] pharmacy phone numbers are listed on [email or web address]

## 2022-07-03 DIAGNOSIS — I509 Heart failure, unspecified: Secondary | ICD-10-CM | POA: Diagnosis not present

## 2022-07-03 DIAGNOSIS — R570 Cardiogenic shock: Secondary | ICD-10-CM | POA: Diagnosis not present

## 2022-07-03 DIAGNOSIS — R579 Shock, unspecified: Secondary | ICD-10-CM | POA: Diagnosis not present

## 2022-07-03 DIAGNOSIS — I2699 Other pulmonary embolism without acute cor pulmonale: Secondary | ICD-10-CM | POA: Diagnosis not present

## 2022-07-03 LAB — COOXEMETRY PANEL
Carboxyhemoglobin: 1.9 % — ABNORMAL HIGH (ref 0.5–1.5)
Methemoglobin: <0.7 % (ref 0.0–1.5)
O2 Saturation: 58.9 %
Total hemoglobin: 11.5 g/dL — ABNORMAL LOW (ref 12.0–16.0)

## 2022-07-03 LAB — GLUCOSE, CAPILLARY
Glucose-Capillary: 128 mg/dL — ABNORMAL HIGH (ref 70–99)
Glucose-Capillary: 136 mg/dL — ABNORMAL HIGH (ref 70–99)
Glucose-Capillary: 138 mg/dL — ABNORMAL HIGH (ref 70–99)
Glucose-Capillary: 162 mg/dL — ABNORMAL HIGH (ref 70–99)
Glucose-Capillary: 164 mg/dL — ABNORMAL HIGH (ref 70–99)
Glucose-Capillary: 164 mg/dL — ABNORMAL HIGH (ref 70–99)
Glucose-Capillary: 202 mg/dL — ABNORMAL HIGH (ref 70–99)

## 2022-07-03 LAB — BASIC METABOLIC PANEL
Anion gap: 9 (ref 5–15)
BUN: 24 mg/dL — ABNORMAL HIGH (ref 6–20)
CO2: 26 mmol/L (ref 22–32)
Calcium: 7.7 mg/dL — ABNORMAL LOW (ref 8.9–10.3)
Chloride: 96 mmol/L — ABNORMAL LOW (ref 98–111)
Creatinine, Ser: 0.97 mg/dL (ref 0.61–1.24)
GFR, Estimated: >60 mL/min (ref >60)
Glucose, Bld: 133 mg/dL — ABNORMAL HIGH (ref 70–99)
Potassium: 3.8 mmol/L (ref 3.5–5.1)
Sodium: 131 mmol/L — ABNORMAL LOW (ref 135–145)

## 2022-07-03 LAB — HEPATIC FUNCTION PANEL
ALT: 804 U/L — ABNORMAL HIGH (ref 0–44)
AST: 307 U/L — ABNORMAL HIGH (ref 15–41)
Albumin: 2.7 g/dL — ABNORMAL LOW (ref 3.5–5.0)
Alkaline Phosphatase: 71 U/L (ref 38–126)
Bilirubin, Direct: 0.7 mg/dL — ABNORMAL HIGH (ref 0.0–0.2)
Indirect Bilirubin: 1 mg/dL — ABNORMAL HIGH (ref 0.3–0.9)
Total Bilirubin: 1.7 mg/dL — ABNORMAL HIGH (ref 0.3–1.2)
Total Protein: 6.2 g/dL — ABNORMAL LOW (ref 6.5–8.1)

## 2022-07-03 LAB — CBC
HCT: 34.8 % — ABNORMAL LOW (ref 39.0–52.0)
Hemoglobin: 10.7 g/dL — ABNORMAL LOW (ref 13.0–17.0)
MCH: 26.8 pg (ref 26.0–34.0)
MCHC: 30.7 g/dL (ref 30.0–36.0)
MCV: 87.2 fL (ref 80.0–100.0)
Platelets: 191 10*3/uL (ref 150–400)
RBC: 3.99 MIL/uL — ABNORMAL LOW (ref 4.22–5.81)
RDW: 14.8 % (ref 11.5–15.5)
WBC: 10.7 10*3/uL — ABNORMAL HIGH (ref 4.0–10.5)
nRBC: 0 % (ref 0.0–0.2)

## 2022-07-03 LAB — CULTURE, BLOOD (ROUTINE X 2)

## 2022-07-03 LAB — MAGNESIUM: Magnesium: 2 mg/dL (ref 1.7–2.4)

## 2022-07-03 LAB — HEPARIN LEVEL (UNFRACTIONATED): Heparin Unfractionated: 0.55 IU/mL (ref 0.30–0.70)

## 2022-07-03 MED ORDER — PANTOPRAZOLE SODIUM 40 MG PO TBEC
40.0000 mg | DELAYED_RELEASE_TABLET | Freq: Every day | ORAL | Status: DC
  Administered 2022-07-03 – 2022-07-07 (×5): 40 mg via ORAL
  Filled 2022-07-03 (×5): qty 1

## 2022-07-03 MED ORDER — POLYETHYLENE GLYCOL 3350 17 G PO PACK
17.0000 g | PACK | Freq: Every day | ORAL | Status: DC
  Administered 2022-07-04 – 2022-07-07 (×3): 17 g via ORAL
  Filled 2022-07-03 (×4): qty 1

## 2022-07-03 MED ORDER — DOCUSATE SODIUM 50 MG/5ML PO LIQD: 100.0000 mg | Freq: Two times a day (BID) | ORAL | Status: DC

## 2022-07-03 MED ORDER — DOCUSATE SODIUM 100 MG PO CAPS
100.0000 mg | ORAL_CAPSULE | Freq: Two times a day (BID) | ORAL | Status: DC
  Administered 2022-07-03 – 2022-07-07 (×9): 100 mg via ORAL
  Filled 2022-07-03 (×9): qty 1

## 2022-07-03 NOTE — Progress Notes (Signed)
ANTICOAGULATION CONSULT NOTE -   Pharmacy Consult for heparin Indication: pulmonary embolus/DVT/LV thrombus  No Known Allergies  Patient Measurements: Height: 6\' 2"  (188 cm) Weight: 124.1 kg (273 lb 9.5 oz) IBW/kg (Calculated) : 82.2 Heparin Dosing Weight: 110kg  Vital Signs: Temp: 97.7 F (36.5 C) (06/25 0741) Temp Source: Oral (06/25 0741) BP: 85/65 (06/25 0700) Pulse Rate: 83 (06/25 0700)  Labs: Recent Labs    06/30/22 1810 06/30/22 2010 07/01/22 0336 07/01/22 0337 07/01/22 0350 07/01/22 0702 07/01/22 1127 07/02/22 0354 07/02/22 0514 07/02/22 1315 07/02/22 2012 07/03/22 0459  HGB 11.5*  --  11.5*  --  12.2*  --   --  11.2*  --   --   --  10.7*  HCT 35.9*  --  34.8*  --  36.0*  --   --  36.2*  --   --   --  34.8*  PLT 239  --  218  --   --   --   --  198  --   --   --  191  APTT  --   --  38*  --   --   --   --   --   --   --   --   --   LABPROT  --   --   --  23.1*  --   --   --   --   --   --   --   --   INR  --   --   --  2.0*  --   --   --   --   --   --   --   --   HEPARINUNFRC  --   --   --   --   --   --    < >  --    < > 0.47 0.32 0.55  CREATININE 1.21  --   --  1.06  --  1.05  --  1.16  --   --   --   --   TROPONINIHS 72* 72*  --   --   --  63*  --   --   --   --   --   --    < > = values in this interval not displayed.     Estimated Creatinine Clearance: 106.7 mL/min (by C-G formula based on SCr of 1.16 mg/dL).  Assessment: 52 yo male with history of DVT found to have acute PE  and large LV thrombus in setting of acute decompensated biventricular HF. Clot burden was low,  however TPA 100mg  iv x1 was administered due to hemodynamic compromise. Heparin was resumed 6/23 AM. Patient prescribed Eliquis PTA for DVT,  but was not taking medications.   Heparin level therapeutic (0.55) on heparin infusion at 2300 units/hr. No bleeding noted. CBC stable   Goal of Therapy:  Heparin level 0.3-0.7 units/ml Monitor platelets by anticoagulation protocol: Yes    Plan:  Continue IV heparin 2300 units/hr  Daily heparin level and CBC Monitor s/s bleeding   Leota Sauers Pharm.D. CPP, BCPS Clinical Pharmacist [phone] 07/03/2022 7:48 AM

## 2022-07-03 NOTE — Progress Notes (Signed)
NAME:  Jerry Saunders, MRN:  [id number], DOB:  1970/09/17, LOS: 2 ADMISSION DATE:  06/30/2022, CONSULTATION DATE:  06/30/22 REFERRING MD:  Lynelle Doctor - EM, CHIEF COMPLAINT:  SOB   History of Present Illness:  52 yo M PMH NICM EF <15% followed by [facility] and locally by [place] [facility], Hx DVT noncompliant w DOAC, LV thrombus, DM2, Severe pHTN mPAP 52 in June 2023 WHO group II, who presented to ED 6/22 with SOB and fatigue x a few weeks, some associated leg swelling. Worse sx 6/22 prompting ED presentation. In ED CXR suggestive of pulm edema, with hx of dvt DOAC noncompliace tachycardia- was sent for CTA chest which revealed RLL proximal segmental PE -- read as minimal clot burden and no evidence of R heart strain.  Received lasix in the ED. Became more tachycardic and hypotensive in ED and was started on levophed, prompting PCCM consultation . Also hypoxemic needing [place] o2 (refused BiPAP). Per cards Dr ToliaJune 2023 MRI Cardiology with severe low flow state LVEDV 444 mL and 27% EF for both LV and RV. LV very dilated and thus RV/LV ratio on CT will be inaccurate.  Admitted to [facility] 4/14-4/18/24 with decomp HFrEF + DVT   N.b.  - CTA also w 9mm pulmonary nodule on R -- rec CT chest in 3-56mo  - per Dr Odis Hollingshead - has missed many cardiology followup appointments  Pertinent  Medical History  NICM Biventricular systolic congestive heart failure Severe pHTN group II  DM2 Obesity  DVT    has a past medical history of Cardiomyopathy (HCC), CHF (congestive heart failure) (HCC), Diabetes mellitus without complication (HCC), DVT (deep venous thrombosis) (HCC), Hyperlipidemia, and Hypertension.   has a past surgical history that includes RIGHT/LEFT HEART CATH AND CORONARY ANGIOGRAPHY (N/A, 06/20/2021).   Significant Hospital Events: Including procedures, antibiotic start and stop dates in addition to other pertinent events   07/01/22 to ED w tachycardia, SOB. Found to have PE -- is small but possibly hemodynamically  significant when taking into account his severe underlying chronic dz -- severe pHTN, NICM EF <15%   . [facility]  Interim History / Subjective:  Patient went into a flutter with heart rate in the 160s, required amiodarone 150 mg bolus x 1 Remain on amiodarone infusion, currently in sinus rhythm On milrinone and Levophed  Made 1.5 L of urine, remains net +1 L in last 24 hours  Objective   Blood pressure (!) 85/65, pulse 83, temperature 97.7 F (36.5 C), temperature source Oral, resp. rate (!) 21, height 6\' 2"  (1.88 m), weight 124.1 kg, SpO2 98 %.    FiO2 (%):  [55 %] 55 %   Intake/Output Summary (Last 24 hours) at 07/03/2022 2595 Last data filed at 07/03/2022 6387 Gross per 24 hour  Intake 2264.76 ml  Output 1500 ml  Net 764.76 ml   Filed Weights   07/01/22 0200 07/02/22 0500 07/03/22 0500  Weight: 124.8 kg 124 kg 124.1 kg    Examination: Physical exam: General: Middle-age male male, lying on the bed HEENT: [place]/AT, eyes anicteric.  moist mucus membranes Neuro: Alert, awake following commands Chest: Coarse breath sounds, no wheezes or rhonchi Heart: Regular rate and rhythm, no murmurs or gallops Abdomen: Soft, nontender, nondistended, bowel sounds present Skin: No rash  Labs and images were reviewed  Assessment & Plan:  Acute hypoxic respiratory failure Obstructive sleep apnea, noncompliant with CPAP Acute right lower lobe PE s/p systemic tPA History of LV thrombus, noncompliant  with anticoagulation History of DVT, noncompliance with anticoagulation Paroxysmal A-fib/flutter with RVR, converted to sinus rhythm Acute on chronic biventricular systolic/diastolic CHF with cardiogenic shock [place]and cardiac ischemia Severe pulmonary hypertension Hypervolemic hyponatremia Anemia of chronic disease Obesity Diabetes type 2  Continue nasal cannula oxygen, titrate with O2 sat goal 92% Patient has been refusing BiPAP Continue IV heparin infusion, switch to  DOAC Repeat echocardiogram showed EF less than 20% with grade 3 diastolic dysfunction also RV hypokinesis and pulmonary hypertension Doppler of lower extremity showed age-indeterminate DVT of the left gastrocnemius vein Remains sinus rhythm post amiodarone bolus Continue amiodarone infusion [place] is is pending today Continue milrinone and Levophed, titrate with MAP goal 65 Made 1.5 L of urine, remains net +1 L in last 24 hours Monitor intake and output Advanced heart failure team is following Serum sodium remained at 131 Closely monitor electrolytes Monitor H&H Monitor fingerstick goal 140-180 Counseling provided regarding diet and exercise  Best Practice (right click and "Reselect all SmartList Selections" daily)   Diet/type: Regular consistency DVT prophylaxis: Systemic heparin GI prophylaxis: PPI Lines: N/A -  Foley:  N/A Code Status:  full code Last date of multidisciplinary goals of care discussion [07/01/22 - wife and patient updated   The patient is critically ill due to cardiogenic shock/acute respiratory failure/acute PE.  Critical care was necessary to treat or prevent imminent or life-threatening deterioration.  Critical care was time spent personally by me on the following activities: development of treatment plan with patient and/or surrogate as well as nursing, discussions with consultants, evaluation of patient's response to treatment, examination of patient, obtaining history from patient or surrogate, ordering and performing treatments and interventions, ordering and review of laboratory studies, ordering and review of radiographic studies, pulse oximetry, re-evaluation of patient's condition and participation in multidisciplinary rounds.   During this encounter critical care time was devoted to patient care services described in this note for 32 minutes.     Cheri Fowler, MD [place] [facility] See Amion for pager If no response to pager, please call  [phone] until 7pm After 7pm, Please call E-link [phone]   LABS    PULMONARY Recent Labs  Lab 07/01/22 0350 07/01/22 1612 07/01/22 1933 07/02/22 0512 07/02/22 0615  PHART 7.419  --   --   --   --   PCO2ART 35.5  --   --   --   --   PO2ART 97  --   --   --   --   HCO3 23.0  --   --   --   --   TCO2 24  --   --   --   --   O2SAT 98 57.8 74 58.8 62.3    CBC Recent Labs  Lab 07/01/22 0336 07/01/22 0350 07/02/22 0354 07/03/22 0459  HGB 11.5* 12.2* 11.2* 10.7*  HCT 34.8* 36.0* 36.2* 34.8*  WBC 9.9  --  10.8* 10.7*  PLT 218  --  198 191    COAGULATION Recent Labs  Lab 07/01/22 0337  INR 2.0*    CARDIAC  No results for input(s): "TROPONINI" in the last 168 hours. No results for input(s): "PROBNP" in the last 168 hours.   CHEMISTRY Recent Labs  Lab 06/30/22 1810 06/30/22 2010 07/01/22 0336 07/01/22 0337 07/01/22 0350 07/01/22 0702 07/02/22 0354  NA 133*  --   --  132* 135 135 133*  K 3.6  --   --  3.6 3.5 3.7 4.0  CL 98  --   --  99  --  97* 99  CO2 24  --   --  22  --  23 24  GLUCOSE 139*  --   --  145*  --  125* 139*  BUN 31*  --   --  27*  --  26* 28*  CREATININE 1.21  --   --  1.06  --  1.05 1.16  CALCIUM 8.0*  --   --  7.8*  --  7.8* 7.7*  MG  --  2.4 2.3  --   --  2.1 2.2  PHOS  --   --  3.9  --   --  3.6 3.4   Estimated Creatinine Clearance: 106.7 mL/min (by C-G formula based on SCr of 1.16 mg/dL).   LIVER Recent Labs  Lab 07/01/22 0337 07/02/22 0354  AST 192* 781*  ALT 448* 938*  ALKPHOS 79 73  BILITOT 3.1* 2.0*  PROT 6.6 6.4*  ALBUMIN 3.0* 2.8*  INR 2.0*  --      INFECTIOUS Recent Labs  Lab 06/30/22 1811 07/01/22 0336 07/01/22 0702  LATICACIDVEN 1.8 1.8 1.7     ENDOCRINE CBG (last 3)  Recent Labs    07/02/22 2354 07/03/22 0358 07/03/22 0738  GLUCAP 133* 138* 162*         IMAGING x48h  - image(s) personally visualized  -   highlighted in bold VAS [place] LOWER EXTREMITY VENOUS (DVT)  Result Date:  07/02/2022  Lower Venous DVT Study Patient Name:  MOOSA BUECHE  Date of Exam:   07/02/2022 Medical Rec #: [id number]      Accession #:    [id number] Date of Birth: November 21, 1970      Patient Gender: M Patient Age:   25 years Exam Location:  [facility] Procedure:      VAS [place] LOWER EXTREMITY VENOUS (DVT) Referring Phys: GRACE BOWSER --------------------------------------------------------------------------------  Indications: Swelling.  Comparison Study: Previous 06/19/21 age indeterminate in left gastroc Performing Technologist: McKayla Maag RVT, [place]  Examination Guidelines: A complete evaluation includes B-mode imaging, spectral Doppler, color Doppler, and power Doppler as needed of all accessible portions of each vessel. Bilateral testing is considered an integral part of a complete examination. Limited examinations for reoccurring indications may be performed as noted. The reflux portion of the exam is performed with the patient in reverse Trendelenburg.  +--------+---------------+---------+-----------+----------+--------------------+ RIGHT   CompressibilityPhasicitySpontaneityPropertiesThrombus Aging       +--------+---------------+---------+-----------+----------+--------------------+ CFV     Full           Yes      Yes                                       +--------+---------------+---------+-----------+----------+--------------------+ SFJ     Full                                                              +--------+---------------+---------+-----------+----------+--------------------+ FV Prox Full                                                              +--------+---------------+---------+-----------+----------+--------------------+  FV Mid  Full                                                              +--------+---------------+---------+-----------+----------+--------------------+ FV                     Yes      Yes                  Patent by color       Distal                                               doppler              +--------+---------------+---------+-----------+----------+--------------------+ PFV     Full                                                              +--------+---------------+---------+-----------+----------+--------------------+ POP     Full           Yes      Yes                                       +--------+---------------+---------+-----------+----------+--------------------+ PTV     Full                                                              +--------+---------------+---------+-----------+----------+--------------------+ PERO    Full                                                              +--------+---------------+---------+-----------+----------+--------------------+   +--------+---------------+---------+-----------+----------+--------------------+ LEFT    CompressibilityPhasicitySpontaneityPropertiesThrombus Aging       +--------+---------------+---------+-----------+----------+--------------------+ CFV     Full           Yes      Yes                                       +--------+---------------+---------+-----------+----------+--------------------+ SFJ     Full                                                              +--------+---------------+---------+-----------+----------+--------------------+ FV Prox Full                                                              +--------+---------------+---------+-----------+----------+--------------------+  FV Mid  Full                                                              +--------+---------------+---------+-----------+----------+--------------------+ FV                     Yes      Yes                  Patent by color      Distal                                               doppler              +--------+---------------+---------+-----------+----------+--------------------+  PFV     Full                                                              +--------+---------------+---------+-----------+----------+--------------------+ POP     Full           Yes      Yes                                       +--------+---------------+---------+-----------+----------+--------------------+ PTV     Full                                                              +--------+---------------+---------+-----------+----------+--------------------+ PERO    Full                                                              +--------+---------------+---------+-----------+----------+--------------------+     Summary: RIGHT: - There is no evidence of deep vein thrombosis in the lower extremity. However, portions of this examination were limited- see technologist comments above.  - No cystic structure found in the popliteal fossa.  LEFT: - There is no evidence of deep vein thrombosis in the lower extremity. However, portions of this examination were limited- see technologist comments above.  - No cystic structure found in the popliteal fossa.  *See table(s) above for measurements and observations. Electronically signed by Gerarda Fraction on 07/02/2022 at 6:00:05 PM.    Final    DG CHEST PORT 1 VIEW  Result Date: 07/01/2022 CLINICAL DATA:  Catheter placement. EXAM: PORTABLE CHEST 1 VIEW COMPARISON:  June 30, 2022. FINDINGS: Stable cardiomegaly. Interval placement of left subclavian catheter with distal tip in expected position of the SVC. No pneumothorax is noted. Central pulmonary vascular congestion is again  noted. Possible bilateral pulmonary edema is noted. Bony thorax is unremarkable. IMPRESSION: Interval placement of left subclavian catheter with distal tip in expected position of the SVC. No pneumothorax. Electronically Signed   By: Lupita Raider M.D.   On: 07/01/2022 16:05   ECHOCARDIOGRAM COMPLETE  Result Date: 07/01/2022    ECHOCARDIOGRAM REPORT   Patient Name:    DURELL LOFASO Date of Exam: 07/01/2022 Medical Rec #:  [id number]     Height:       74.0 in Accession #:    [id number]    Weight:       275.1 lb Date of Birth:  08-04-1970     BSA:          2.489 m Patient Age:    51 years      BP:           100/79 mmHg Patient Gender: M             HR:           100 bpm. Exam Location:  Inpatient Procedure: 2D Echo, Color Doppler, Cardiac Doppler and Intracardiac            Opacification Agent Indications:     I50.9* Heart failure (unspecified); I26.02 Pulmonary embolus  History:         Patient has prior history of Echocardiogram examinations, most                  recent 06/19/2021. CHF, Pulmonary HTN, Arrythmias:Atrial                  Fibrillation; Risk Factors:Hypertension, Diabetes and                  Dyslipidemia.  Sonographer:     Irving Burton Senior RDCS Referring Phys:  [id number] Tessa Lerner Diagnosing Phys: Tessa Lerner DO IMPRESSIONS  1. Left ventricular thrombus 2.7 x 1.6cm (largest dimension). Left ventricular ejection fraction, by estimation, is <20%. The left ventricle has severely decreased function. The left ventricle demonstrates global hypokinesis. The left ventricular internal cavity size was severely dilated. Left ventricular diastolic parameters are consistent with Grade III diastolic dysfunction (restrictive). There is the interventricular septum is flattened in systole and diastole, consistent with right ventricular pressure and volume overload.  2. Degree of pulmonary hypertension is underestimated due to dialted RA and RV size. Right ventricular systolic function is severely reduced. The right ventricular size is severely enlarged. There is moderately elevated pulmonary artery systolic pressure. The estimated right ventricular systolic pressure is 49.3 mmHg.  3. Left atrial size was severely dilated.  4. Right atrial size was severely dilated.  5. The mitral valve is degenerative. Moderate to severe mitral valve regurgitation. No evidence of mitral stenosis.  6.  Tricuspid valve regurgitation is moderate to severe.  7. The aortic valve is tricuspid. Aortic valve regurgitation is not visualized. No aortic stenosis is present.  8. The inferior vena cava is dilated in size with <50% respiratory variability, suggesting right atrial pressure of 15 mmHg. Comparison(s): Prior echo 06/19/2021: LVEF <20%, global HK, LV dilated, RV dilated and reduced function, RAP . Conclusion(s)/Recommendation(s): Findings consistent with Dilated biventricular heart failure. Left ventricular thrombus present as noted above. FINDINGS  Left Ventricle: Left ventricular thrombus 2.7 x 1.6cm (largest dimension). Left ventricular ejection fraction, by estimation, is <20%. The left ventricle has severely decreased function. The left ventricle demonstrates global hypokinesis. Definity contrast agent was given IV to delineate the left ventricular endocardial borders. The left  ventricular internal cavity size was severely dilated. There is no left ventricular hypertrophy. The interventricular septum is flattened in systole and diastole,  consistent with right ventricular pressure and volume overload. Left ventricular diastolic parameters are consistent with Grade III diastolic dysfunction (restrictive). Right Ventricle: Degree of pulmonary hypertension is underestimated due to dialted RA and RV size. The right ventricular size is severely enlarged. Right vetricular wall thickness was not well visualized. Right ventricular systolic function is severely reduced. There is moderately elevated pulmonary artery systolic pressure. The tricuspid regurgitant velocity is 2.93 m/s, and with an assumed right atrial pressure of 15 mmHg, the estimated right ventricular systolic pressure is 49.3 mmHg. Left Atrium: Left atrial size was severely dilated. Right Atrium: Right atrial size was severely dilated. Pericardium: There is no evidence of pericardial effusion. Mitral Valve: The mitral valve is degenerative in  appearance. There is mild thickening of the mitral valve leaflet(s). Moderate to severe mitral valve regurgitation, with centrally-directed jet. No evidence of mitral valve stenosis. Tricuspid Valve: The tricuspid valve is grossly normal. Tricuspid valve regurgitation is moderate to severe. No evidence of tricuspid stenosis. Aortic Valve: The aortic valve is tricuspid. Aortic valve regurgitation is not visualized. No aortic stenosis is present. Pulmonic Valve: The pulmonic valve was grossly normal. Pulmonic valve regurgitation is mild. No evidence of pulmonic stenosis. Aorta: The aortic root and ascending aorta are structurally normal, with no evidence of dilitation. Venous: The inferior vena cava is dilated in size with less than 50% respiratory variability, suggesting right atrial pressure of 15 mmHg. IAS/Shunts: The interatrial septum was not well visualized.  LEFT VENTRICLE PLAX 2D LVIDd:         6.90 cm      Diastology LVIDs:         6.70 cm      LV e' medial:    4.68 cm/s LV PW:         0.90 cm      LV E/e' medial:  17.3 LV IVS:        0.80 cm      LV e' lateral:   9.57 cm/s LVOT diam:     1.80 cm      LV E/e' lateral: 8.5 LV SV:         26 LV SV Index:   10 LVOT Area:     2.54 cm  LV Volumes (MOD) LV vol d, MOD A2C: 232.0 ml LV vol d, MOD A4C: 281.0 ml LV vol s, MOD A2C: 218.0 ml LV vol s, MOD A4C: 212.0 ml LV SV MOD A2C:     14.0 ml LV SV MOD A4C:     281.0 ml LV SV MOD BP:      49.6 ml RIGHT VENTRICLE RV S prime:     12.10 cm/s TAPSE (M-mode): 1.3 cm LEFT ATRIUM              Index        RIGHT ATRIUM           Index LA diam:        5.20 cm  2.09 cm/m   RA Area:     35.00 cm LA Vol (A2C):   169.0 ml 67.90 ml/m  RA Volume:   135.00 ml 54.24 ml/m LA Vol (A4C):   133.0 ml 53.44 ml/m LA Biplane Vol: 166.0 ml 66.69 ml/m  AORTIC VALVE LVOT Vmax:   73.40 cm/s LVOT Vmean:  56.200 cm/s LVOT VTI:    0.101 m  AORTA Ao Root diam: 3.10 cm Ao Asc diam:  3.10 cm MITRAL VALVE                  TRICUSPID VALVE MV Area  (PHT): 3.60 cm       TR Peak grad:   34.3 mmHg MV Decel Time: 211 msec       TR Vmax:        293.00 cm/s MR Peak grad:    50.1 mmHg MR Mean grad:    35.0 mmHg    SHUNTS MR Vmax:         354.00 cm/s  Systemic VTI:  0.10 m MR Vmean:        278.0 cm/s   Systemic Diam: 1.80 cm MR PISA:         2.26 cm MR PISA Eff ROA: 25 mm MR PISA Radius:  0.60 cm MV E velocity: 80.90 cm/s Sunit Tolia DO Electronically signed by Tessa Lerner DO Signature Date/Time: 07/01/2022/11:12:36 AM    Final

## 2022-07-03 NOTE — Progress Notes (Addendum)
Advanced Heart Failure Rounding Note  PCP-Cardiologist: None   Subjective:   Patient admitted with cardiogenic shock 2/2 noncompliance. CT showed RLL PE. S/p TPA 07/01/22: went into AFL RVR. Started on IV amio>>NSR.   co-ox pending on milrinone 0.125 mcg/kg/min Levo @ 3  SCr/K pending. -1.5L UOP, weight unchanged. Diuresing with 80 IV lasix BID. CVP 11  Feels better this morning. Breathing improving. Denies CP.   Objective:   Echo 07/01/22: EF 20% RV severely down Large LV clot   Weight Range: 124.1 kg Body mass index is 35.13 kg/m.   Vital Signs:   Temp:  [97.9 F (36.6 C)-98.8 F (37.1 C)] 98.2 F (36.8 C) (06/25 0300) Pulse Rate:  [31-172] 83 (06/25 0700) Resp:  [13-37] 21 (06/25 0700) BP: (83-123)/(55-104) 85/65 (06/25 0700) SpO2:  [76 %-100 %] 98 % (06/25 0700) FiO2 (%):  [55 %] 55 % (06/24 1950) Weight:  [124.1 kg] 124.1 kg (06/25 0500) Last BM Date : 06/30/22  Weight change: Filed Weights   07/01/22 0200 07/02/22 0500 07/03/22 0500  Weight: 124.8 kg 124 kg 124.1 kg   Intake/Output:   Intake/Output Summary (Last 24 hours) at 07/03/2022 0703 Last data filed at 07/03/2022 1610 Gross per 24 hour  Intake 2439.18 ml  Output 1500 ml  Net 939.18 ml     Physical Exam  CVP 11 General:  well appearing.  No respiratory difficulty HEENT: normal, +glasses. LIJ CVC Neck: supple. JVD ~14 cm. Carotids 2+ bilat; no bruits. No lymphadenopathy or thyromegaly appreciated. Cor: PMI nondisplaced. Regular rate & rhythm. No rubs, gallops or murmurs. Lungs: clear Abdomen: soft, nontender, nondistended. No hepatosplenomegaly. No bruits or masses. Good bowel sounds. Extremities: no cyanosis, clubbing, rash, trace BLE edema. +TED hose Neuro: alert & oriented x 3, cranial nerves grossly intact. moves all 4 extremities w/o difficulty. Affect pleasant.   Telemetry   NSR 80s, short runs NSVT (Personally reviewed)    EKG    No new EKG to review  Labs    CBC Recent Labs     07/02/22 0354 07/03/22 0459  WBC 10.8* 10.7*  HGB 11.2* 10.7*  HCT 36.2* 34.8*  MCV 87.4 87.2  PLT 198 191   Basic Metabolic Panel Recent Labs    96/04/54 0702 07/02/22 0354  NA 135 133*  K 3.7 4.0  CL 97* 99  CO2 23 24  GLUCOSE 125* 139*  BUN 26* 28*  CREATININE 1.05 1.16  CALCIUM 7.8* 7.7*  MG 2.1 2.2  PHOS 3.6 3.4   Liver Function Tests Recent Labs    07/01/22 0337 07/02/22 0354  AST 192* 781*  ALT 448* 938*  ALKPHOS 79 73  BILITOT 3.1* 2.0*  PROT 6.6 6.4*  ALBUMIN 3.0* 2.8*   No results for input(s): "LIPASE", "AMYLASE" in the last 72 hours. Cardiac Enzymes No results for input(s): "CKTOTAL", "CKMB", "CKMBINDEX", "TROPONINI" in the last 72 hours.  BNP: BNP (last 3 results) Recent Labs    06/30/22 1810 07/01/22 0337  BNP 2,206.2* 1,884.1*   ProBNP (last 3 results) Recent Labs    01/19/22 1550  PROBNP 540*   D-Dimer No results for input(s): "DDIMER" in the last 72 hours. Hemoglobin A1C No results for input(s): "HGBA1C" in the last 72 hours. Fasting Lipid Panel No results for input(s): "CHOL", "HDL", "LDLCALC", "TRIG", "CHOLHDL", "LDLDIRECT" in the last 72 hours. Thyroid Function Tests No results for input(s): "TSH", "T4TOTAL", "T3FREE", "THYROIDAB" in the last 72 hours.  Invalid input(s): "FREET3"  Other results:  Imaging  VAS [place] LOWER EXTREMITY VENOUS (DVT)  Result Date: 07/02/2022  Lower Venous DVT Study Patient Name:  TAVEN STRITE  Date of Exam:   07/02/2022 Medical Rec #: [id number]      Accession #:    [id number] Date of Birth: [birth year]      Patient Gender: M Patient Age:   89 years Exam Location:  [facility] Procedure:      VAS [place] LOWER EXTREMITY VENOUS (DVT) Referring Phys: GRACE BOWSER --------------------------------------------------------------------------------  Indications: Swelling.  Comparison Study: Previous 06/19/21 age indeterminate in left gastroc Performing Technologist: McKayla Maag RVT, [place]  Examination  Guidelines: A complete evaluation includes B-mode imaging, spectral Doppler, color Doppler, and power Doppler as needed of all accessible portions of each vessel. Bilateral testing is considered an integral part of a complete examination. Limited examinations for reoccurring indications may be performed as noted. The reflux portion of the exam is performed with the patient in reverse Trendelenburg.  +--------+---------------+---------+-----------+----------+--------------------+ RIGHT   CompressibilityPhasicitySpontaneityPropertiesThrombus Aging       +--------+---------------+---------+-----------+----------+--------------------+ CFV     Full           Yes      Yes                                       +--------+---------------+---------+-----------+----------+--------------------+ SFJ     Full                                                              +--------+---------------+---------+-----------+----------+--------------------+ FV Prox Full                                                              +--------+---------------+---------+-----------+----------+--------------------+ FV Mid  Full                                                              +--------+---------------+---------+-----------+----------+--------------------+ FV                     Yes      Yes                  Patent by color      Distal                                               doppler              +--------+---------------+---------+-----------+----------+--------------------+ PFV     Full                                                              +--------+---------------+---------+-----------+----------+--------------------+  POP     Full           Yes      Yes                                       +--------+---------------+---------+-----------+----------+--------------------+ PTV     Full                                                               +--------+---------------+---------+-----------+----------+--------------------+ PERO    Full                                                              +--------+---------------+---------+-----------+----------+--------------------+   +--------+---------------+---------+-----------+----------+--------------------+ LEFT    CompressibilityPhasicitySpontaneityPropertiesThrombus Aging       +--------+---------------+---------+-----------+----------+--------------------+ CFV     Full           Yes      Yes                                       +--------+---------------+---------+-----------+----------+--------------------+ SFJ     Full                                                              +--------+---------------+---------+-----------+----------+--------------------+ FV Prox Full                                                              +--------+---------------+---------+-----------+----------+--------------------+ FV Mid  Full                                                              +--------+---------------+---------+-----------+----------+--------------------+ FV                     Yes      Yes                  Patent by color      Distal                                               doppler              +--------+---------------+---------+-----------+----------+--------------------+ PFV     Full                                                              +--------+---------------+---------+-----------+----------+--------------------+  POP     Full           Yes      Yes                                       +--------+---------------+---------+-----------+----------+--------------------+ PTV     Full                                                              +--------+---------------+---------+-----------+----------+--------------------+ PERO    Full                                                               +--------+---------------+---------+-----------+----------+--------------------+     Summary: RIGHT: - There is no evidence of deep vein thrombosis in the lower extremity. However, portions of this examination were limited- see technologist comments above.  - No cystic structure found in the popliteal fossa.  LEFT: - There is no evidence of deep vein thrombosis in the lower extremity. However, portions of this examination were limited- see technologist comments above.  - No cystic structure found in the popliteal fossa.  *See table(s) above for measurements and observations. Electronically signed by Gerarda Fraction on 07/02/2022 at 6:00:05 PM.    Final     Medications:     Scheduled Medications:  Chlorhexidine Gluconate Cloth  6 each Topical Daily   digoxin  0.125 mg Oral Daily   furosemide  80 mg Intravenous BID   insulin aspart  0-15 Units Subcutaneous Q4H    morphine injection  4 mg Intravenous Once   pantoprazole (PROTONIX) IV  40 mg Intravenous Q24H    Infusions:  sodium chloride 20 mL/hr at 07/03/22 0625   amiodarone 30 mg/hr (07/03/22 0625)   heparin 2,300 Units/hr (07/03/22 0625)   milrinone 0.125 mcg/kg/min (07/03/22 0625)   norepinephrine (LEVOPHED) Adult infusion 3 mcg/min (07/03/22 0625)    PRN Medications: docusate sodium, polyethylene glycol, traMADol  Patient Profile  Cottrell Gentles is a 52 y.o. male with systolic HF due to [facility], DM2.obesity, PVCs and noncompliance. AHF to see for cardiogenic shock.  Assessment/Plan  1. 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 RA 16 PA 67/43 (54) PCW 27 Fick 5.8/2.3 - cMRI EF 20% RV 27% Minimal LGE - Echo 07/01/22 EF 20% RV severely down. Large LV clot - Suspect PE is incidental due to severe RV stasis. Main issues is advanced HF - Now on NE for inotropic and BP support. LA 1.7 - co-ox, none today, ordered. On milrinone 0.125 mcg/kg/min, may need to increase to  augment diuresis - Volume overloaded -> on 80 IV lasix BID. Good diuresis. -1.5L UOP. CVP 11 - Continue digoxin 0.125mg  daily - GDMT titration as BP permits when off inotropic support - Ideally needs transplant but non-compliance major issue   2. Recurrent PE - CT 06/30/22 - Right lower lobe proximal segmental pulmonary embolus. Minimal clot burden with no  evidence of right heart strain.  - s/p t-PA at 330a on 06/30/22 - Suspect PE is incidental due to severe RV stasis. Main issues is advanced HF - continue heparin - LE venous duplex negative    3. AFL with RVR - now in NSR on IV amio - continue heparin - eventual DOAC   4. LV thrombus - anticoagulation as above   5. DM2 - per primary   6. Shock liver - continue hemodynamic support   7. Morbid obesity - Body mass index is 35.13 kg/m.    8. Non-compliance  Length of Stay: 2  Alen Bleacher, NP  07/03/2022, 7:03 AM  Advanced Heart Failure Team Pager [phone] (M-F; 7a - 5p)  Please contact [facility] for night-coverage after hours (5p -7a ) and weekends on [email or web address]  Agree with above,   Remains on milrinone and NE. Labs pending (including co-ox). CVP 12-14  Urine output down and urine dark.   Breathing ok but remains weak.   General:  Weak appearing. No resp difficulty HEENT: normal Neck: supple. JVP to ear . Carotids 2+ bilat; no bruits. No lymphadenopathy or thryomegaly appreciated. Cor:  Regular rate & rhythm. +s3 2/6 TR Lungs: clear Abdomen: soft, nontender, nondistended. No hepatosplenomegaly. No bruits or masses. Good bowel sounds. Extremities: no cyanosis, clubbing, rash, 1+ edema Neuro: alert & orientedx3, cranial nerves grossly intact. moves all 4 extremities w/o difficulty. Affect pleasant   Remains tenuous with biventricular HF. Will increase milrinone to 0.25. Await labs and co-ox. Continue diuresis.  Continue IV amio and lasix.   Will need slow transition to GDMT. Will likely need transplant  down the road but working to sort out compliance issues. Seems like these were mostly related to financial challenges.   CRITICAL CARE Performed by: Arvilla Meres  Total critical care time: 45 minutes  Critical care time was exclusive of separately billable procedures and treating other patients.  Critical care was necessary to treat or prevent imminent or life-threatening deterioration.  Critical care was time spent personally by me (independent of midlevel providers or residents) on the following activities: development of treatment plan with patient and/or surrogate as well as nursing, discussions with consultants, evaluation of patient's response to treatment, examination of patient, obtaining history from patient or surrogate, ordering and performing treatments and interventions, ordering and review of laboratory studies, ordering and review of radiographic studies, pulse oximetry and re-evaluation of patient's condition.  Arvilla Meres, MD  12:07 PM

## 2022-07-04 ENCOUNTER — Other Ambulatory Visit (HOSPITAL_COMMUNITY): Payer: Self-pay

## 2022-07-04 DIAGNOSIS — R579 Shock, unspecified: Secondary | ICD-10-CM | POA: Diagnosis not present

## 2022-07-04 LAB — COMPREHENSIVE METABOLIC PANEL
ALT: 596 U/L — ABNORMAL HIGH (ref 0–44)
AST: 157 U/L — ABNORMAL HIGH (ref 15–41)
Albumin: 2.5 g/dL — ABNORMAL LOW (ref 3.5–5.0)
Alkaline Phosphatase: 64 U/L (ref 38–126)
Anion gap: 8 (ref 5–15)
BUN: 14 mg/dL (ref 6–20)
CO2: 30 mmol/L (ref 22–32)
Calcium: 7.6 mg/dL — ABNORMAL LOW (ref 8.9–10.3)
Chloride: 93 mmol/L — ABNORMAL LOW (ref 98–111)
Creatinine, Ser: 0.79 mg/dL (ref 0.61–1.24)
GFR, Estimated: >60 mL/min (ref >60)
Glucose, Bld: 130 mg/dL — ABNORMAL HIGH (ref 70–99)
Potassium: 3.2 mmol/L — ABNORMAL LOW (ref 3.5–5.1)
Sodium: 131 mmol/L — ABNORMAL LOW (ref 135–145)
Total Bilirubin: 1.8 mg/dL — ABNORMAL HIGH (ref 0.3–1.2)
Total Protein: 6 g/dL — ABNORMAL LOW (ref 6.5–8.1)

## 2022-07-04 LAB — CBC
HCT: 34.7 % — ABNORMAL LOW (ref 39.0–52.0)
Hemoglobin: 11 g/dL — ABNORMAL LOW (ref 13.0–17.0)
MCH: 27.3 pg (ref 26.0–34.0)
MCHC: 31.7 g/dL (ref 30.0–36.0)
MCV: 86.1 fL (ref 80.0–100.0)
Platelets: 204 10*3/uL (ref 150–400)
RBC: 4.03 MIL/uL — ABNORMAL LOW (ref 4.22–5.81)
RDW: 14.7 % (ref 11.5–15.5)
WBC: 9.7 10*3/uL (ref 4.0–10.5)
nRBC: 0 % (ref 0.0–0.2)

## 2022-07-04 LAB — GLUCOSE, CAPILLARY
Glucose-Capillary: 123 mg/dL — ABNORMAL HIGH (ref 70–99)
Glucose-Capillary: 129 mg/dL — ABNORMAL HIGH (ref 70–99)
Glucose-Capillary: 135 mg/dL — ABNORMAL HIGH (ref 70–99)
Glucose-Capillary: 180 mg/dL — ABNORMAL HIGH (ref 70–99)
Glucose-Capillary: 93 mg/dL (ref 70–99)
Glucose-Capillary: 99 mg/dL (ref 70–99)

## 2022-07-04 LAB — COOXEMETRY PANEL
Carboxyhemoglobin: 2 % — ABNORMAL HIGH (ref 0.5–1.5)
Methemoglobin: <0.7 % (ref 0.0–1.5)
O2 Saturation: 78.4 %
Total hemoglobin: 11.3 g/dL — ABNORMAL LOW (ref 12.0–16.0)

## 2022-07-04 LAB — HEPARIN LEVEL (UNFRACTIONATED): Heparin Unfractionated: 0.44 IU/mL (ref 0.30–0.70)

## 2022-07-04 LAB — CULTURE, BLOOD (ROUTINE X 2)

## 2022-07-04 MED ORDER — EMPAGLIFLOZIN 10 MG PO TABS
10.0000 mg | ORAL_TABLET | Freq: Every day | ORAL | Status: DC
  Administered 2022-07-04 – 2022-07-07 (×4): 10 mg via ORAL
  Filled 2022-07-04 (×4): qty 1

## 2022-07-04 MED ORDER — TORSEMIDE 20 MG PO TABS: 20.0000 mg | ORAL_TABLET | Freq: Every day | ORAL | Status: DC

## 2022-07-04 MED ORDER — FUROSEMIDE 10 MG/ML IJ SOLN
80.0000 mg | Freq: Once | INTRAMUSCULAR | Status: AC
  Administered 2022-07-04: 80 mg via INTRAVENOUS
  Filled 2022-07-04: qty 8

## 2022-07-04 MED ORDER — AMIODARONE HCL 200 MG PO TABS
200.0000 mg | ORAL_TABLET | Freq: Two times a day (BID) | ORAL | Status: DC
  Administered 2022-07-04 – 2022-07-07 (×7): 200 mg via ORAL
  Filled 2022-07-04 (×7): qty 1

## 2022-07-04 MED ORDER — POTASSIUM CHLORIDE CRYS ER 20 MEQ PO TBCR: 40.0000 meq | EXTENDED_RELEASE_TABLET | Freq: Two times a day (BID) | ORAL | Status: DC

## 2022-07-04 MED ORDER — ORAL CARE MOUTH RINSE: 15.0000 mL | OROMUCOSAL | Status: DC | PRN

## 2022-07-04 MED ORDER — POTASSIUM CHLORIDE CRYS ER 20 MEQ PO TBCR
40.0000 meq | EXTENDED_RELEASE_TABLET | Freq: Once | ORAL | Status: AC
  Administered 2022-07-04: 40 meq via ORAL
  Filled 2022-07-04: qty 2

## 2022-07-04 NOTE — Progress Notes (Signed)
CARDIOLOGY 07/04/22  Patient's name: Jerry Saunders.   MRN: [id number].    DOB: 30-Apr-1970  Following the patient peripherally. Patient sitting up in chair at the time of evaluation. CVP 5-7 mmHg. Averaging about 200 cc of urine output per hour, approximately 1900 cc so far Remains on inotropic and vasopressor support.. Ambulated on the floors earlier today. Telemetry notes predominantly sinus rhythm with rare PVCs/ventricular runs Reviewed advanced heart failure documentation-grateful for their help in managing this young/pleasant patient. No family present at bedside Will order compression stockings as opposed to TED hose. Spoke to his nurse at bedside.  No charge.   Tessa Lerner, [place], [facility]  Pager: [phone] Office: [phone]

## 2022-07-04 NOTE — Progress Notes (Addendum)
Advanced Heart Failure Rounding Note  PCP-Cardiologist: None   Subjective:   Patient admitted with cardiogenic shock 2/2 noncompliance. CT showed RLL PE. S/p TPA 07/01/22: AFL RVR. Started on IV amio>>NSR.   CO-OX 78% on 0.25 milrinone + 4 NE.   CVP 8-9. 3.7L UOP yesterday with IV lasix. Weight up 1 lb, but had first standing weight today.  Feels much better. No dyspnea, abdominal bloating and edema improved.   Objective:   Echo 07/01/22: EF 20% RV severely down Large LV clot   Weight Range: 124.4 kg Body mass index is 35.21 kg/m.   Vital Signs:   Temp:  [97.7 F (36.5 C)-99 F (37.2 C)] 98.3 F (36.8 C) (06/26 0340) Pulse Rate:  [74-90] 84 (06/26 0430) Resp:  [15-34] 31 (06/26 0430) BP: (74-112)/(47-80) 103/80 (06/26 0430) SpO2:  [84 %-100 %] 89 % (06/26 0430) Weight:  [124.4 kg] 124.4 kg (06/26 0500) Last BM Date : 07/03/22  Weight change: Filed Weights   07/02/22 0500 07/03/22 0500 07/04/22 0500  Weight: 124 kg 124.1 kg 124.4 kg   Intake/Output:   Intake/Output Summary (Last 24 hours) at 07/04/2022 0703 Last data filed at 07/04/2022 0600 Gross per 24 hour  Intake 1466.3 ml  Output 3651 ml  Net -2184.7 ml     Physical Exam  CVP 8-9 General:  Sitting up in chair.  HEENT: normal Neck: supple. JVP 8-10. Carotids 2+ bilat; no bruits.  Cor: PMI nondisplaced. Regular rate & rhythm. No rubs, gallops, 2/6 MR Lungs: clear Abdomen: soft, nontender, nondistended.  Extremities: no cyanosis, clubbing, rash, trace edema, + TED hose, + RUE P{ICC Neuro: alert & oriented x 3. Affect flat.   Telemetry   SR 70s-80s, 1-2 PVCs/min   Labs    CBC Recent Labs    07/03/22 0459 07/04/22 0504  WBC 10.7* 9.7  HGB 10.7* 11.0*  HCT 34.8* 34.7*  MCV 87.2 86.1  PLT 191 204   Basic Metabolic Panel Recent Labs    62/95/28 0354 07/03/22 0508 07/04/22 0504  NA 133* 131* 131*  K 4.0 3.8 3.2*  CL 99 96* 93*  CO2 24 26 30   GLUCOSE 139* 133* 130*  BUN 28* 24* 14   CREATININE 1.16 0.97 0.79  CALCIUM 7.7* 7.7* 7.6*  MG 2.2 2.0  --   PHOS 3.4  --   --    Liver Function Tests Recent Labs    07/03/22 0501 07/04/22 0504  AST 307* 157*  ALT 804* 596*  ALKPHOS 71 64  BILITOT 1.7* 1.8*  PROT 6.2* 6.0*  ALBUMIN 2.7* 2.5*   No results for input(s): "LIPASE", "AMYLASE" in the last 72 hours. Cardiac Enzymes No results for input(s): "CKTOTAL", "CKMB", "CKMBINDEX", "TROPONINI" in the last 72 hours.  BNP: BNP (last 3 results) Recent Labs    06/30/22 1810 07/01/22 0337  BNP 2,206.2* 1,884.1*   ProBNP (last 3 results) Recent Labs    01/19/22 1550  PROBNP 540*   D-Dimer No results for input(s): "DDIMER" in the last 72 hours. Hemoglobin A1C No results for input(s): "HGBA1C" in the last 72 hours. Fasting Lipid Panel No results for input(s): "CHOL", "HDL", "LDLCALC", "TRIG", "CHOLHDL", "LDLDIRECT" in the last 72 hours. Thyroid Function Tests No results for input(s): "TSH", "T4TOTAL", "T3FREE", "THYROIDAB" in the last 72 hours.  Invalid input(s): "FREET3"  Other results:  Imaging   No results found.  Medications:     Scheduled Medications:  Chlorhexidine Gluconate Cloth  6 each Topical Daily   digoxin  0.125 mg Oral Daily   docusate sodium  100 mg Oral BID   furosemide  80 mg Intravenous BID   insulin aspart  0-15 Units Subcutaneous Q4H   pantoprazole  40 mg Oral Daily   polyethylene glycol  17 g Oral Daily    Infusions:  sodium chloride Stopped (07/04/22 0200)   amiodarone 30 mg/hr (07/04/22 0600)   heparin 2,300 Units/hr (07/04/22 0600)   milrinone 0.25 mcg/kg/min (07/04/22 0624)   norepinephrine (LEVOPHED) Adult infusion 4 mcg/min (07/04/22 0600)    PRN Medications: mouth rinse, traMADol  Patient Profile  Jerry Saunders is a 52 y.o. male with systolic HF due to [facility], DM2, obesity, PVCs and noncompliance. Admitted with cardiogenic shock and recurrent PE.  Assessment/Plan  1. Acute on chronic systolic HF due to NICM ->  cardiogenic shock - Echo 6/23: EF < 20% RV severely HK - Cath 6/23: No CAD. RA 16 PA 67/43 (54) PCW 27 Fick 5.8/2.3 - cMRI 06/23: EF 20% RV 27% Minimal LGE - Echo 07/01/22: EF 20% RV severely down. Large LV clot. Moderate to severe MR. Moderate to severe TR. - Suspect PE is incidental due to severe RV stasis. Main issues is advanced HF - Now on NE for inotropic and BP support. LA 1.7 - CO-OX 78% on 0.25 milrinone + 4 NE. Attempt to wean NE today. Discussed with RN.  - CVP 8-9. Give 1 more dose IV lasix then transition to PO. Not sure he will tolerate CVP much lower with RV failure. - Start SGLT2i - Continue digoxin 0.125mg  daily - GDMT titration as BP permits when off inotropic support - Ideally needs transplant but non-compliance major issue   2. Recurrent PE - CT 06/30/22 - Right lower lobe proximal segmental pulmonary embolus. Minimal clot burden with no evidence of right heart strain.  - s/p t-PA at 330a on 06/30/22 - Suspect PE is incidental due to severe RV stasis. Main issues is advanced HF - continue heparin - LE venous duplex negative    3. AFL with RVR - now in NSR on IV amio. Will switch amio to po. - continue heparin - eventual DOAC   4. LV thrombus - anticoagulation as above  5. Valvular heart disease - Echo this admit with EF  < 20%, moderate to severe MR and moderate to severe TR - Suspect functional   6. DM2 - per primary   7. Shock liver -LFTs improving - continue hemodynamic support   8. Morbid obesity - Body mass index is 35.21 kg/m.    9. Non-compliance   Length of Stay: 3  FINCH, LINDSAY N, PA-C  07/04/2022, 7:03 AM  Advanced Heart Failure Team Pager [phone] (M-F; 7a - 5p)  Please contact [facility] for night-coverage after hours (5p -7a ) and weekends on [email or web address]  Agree with above  Remains on low-dose NE and milrinone 0.25. Co-ox improved to78% CV 5. Remains in NSR on IV amio and heparin   Weak. But no SOB or CP.   General:   Sitting in chair  No resp difficulty HEENT: normal Neck: supple. no JVD. Carotids 2+ bilat; no bruits. No lymphadenopathy or thryomegaly appreciated. Cor. Regular rate & rhythm. No rubs, gallops or murmurs. Lungs: clear Abdomen: obese soft, nontender, nondistended. No hepatosplenomegaly. No bruits or masses. Good bowel sounds. Extremities: no cyanosis, clubbing, rash, edema Neuro: alert & orientedx3, cranial nerves grossly intact. moves all 4 extremities w/o difficulty. Affect pleasant  Rmmains tenuous but improving slowly. Wean off NE. Continue milrinone.  Hold lasix. Continue heparin. Change amio to po.  Start GDMT slowly as tolerated.   CRITICAL CARE Performed by: Arvilla Meres  Total critical care time: 40 minutes  Critical care time was exclusive of separately billable procedures and treating other patients.  Critical care was necessary to treat or prevent imminent or life-threatening deterioration.  Critical care was time spent personally by me (independent of midlevel providers or residents) on the following activities: development of treatment plan with patient and/or surrogate as well as nursing, discussions with consultants, evaluation of patient's response to treatment, examination of patient, obtaining history from patient or surrogate, ordering and performing treatments and interventions, ordering and review of laboratory studies, ordering and review of radiographic studies, pulse oximetry and re-evaluation of patient's condition.   Arvilla Meres, MD  10:14 AM

## 2022-07-04 NOTE — Progress Notes (Signed)
ANTICOAGULATION CONSULT NOTE -   Pharmacy Consult for heparin Indication: pulmonary embolus/DVT/LV thrombus  No Known Allergies  Patient Measurements: Height: 6\' 2"  (188 cm) Weight: 124.4 kg (274 lb 4 oz) IBW/kg (Calculated) : 82.2 Heparin Dosing Weight: 110kg  Vital Signs: Temp: 98.3 F (36.8 C) (06/26 0340) Temp Source: Oral (06/26 0340) BP: 103/80 (06/26 0430) Pulse Rate: 84 (06/26 0430)  Labs: Recent Labs    07/02/22 0354 07/02/22 0514 07/02/22 2012 07/03/22 0459 07/03/22 0508 07/04/22 0504  HGB 11.2*  --   --  10.7*  --  11.0*  HCT 36.2*  --   --  34.8*  --  34.7*  PLT 198  --   --  191  --  204  HEPARINUNFRC  --    < > 0.32 0.55  --  0.44  CREATININE 1.16  --   --   --  0.97 0.79   < > = values in this interval not displayed.     Estimated Creatinine Clearance: 154.8 mL/min (by C-G formula based on SCr of 0.79 mg/dL).  Assessment: 52 yo male with history of DVT found to have acute PE  and large LV thrombus in setting of acute decompensated biventricular HF. Clot burden was low,  however TPA 100mg  iv x1 was administered due to hemodynamic compromise. Heparin was resumed 6/23 AM. Patient prescribed Eliquis PTA for DVT,  but was not taking medications.   Heparin level therapeutic (0.44) on heparin infusion at 2300 units/hr. No bleeding noted. CBC stable   Goal of Therapy:  Heparin level 0.3-0.7 units/ml Monitor platelets by anticoagulation protocol: Yes   Plan:  Continue IV heparin 2300 units/hr  Daily heparin level and CBC Monitor s/s bleeding   Leota Sauers Pharm.D. CPP, BCPS Clinical Pharmacist [phone] 07/04/2022 7:27 AM

## 2022-07-05 DIAGNOSIS — R579 Shock, unspecified: Secondary | ICD-10-CM | POA: Diagnosis not present

## 2022-07-05 LAB — GLUCOSE, CAPILLARY
Glucose-Capillary: 107 mg/dL — ABNORMAL HIGH (ref 70–99)
Glucose-Capillary: 107 mg/dL — ABNORMAL HIGH (ref 70–99)
Glucose-Capillary: 125 mg/dL — ABNORMAL HIGH (ref 70–99)
Glucose-Capillary: 131 mg/dL — ABNORMAL HIGH (ref 70–99)
Glucose-Capillary: 143 mg/dL — ABNORMAL HIGH (ref 70–99)
Glucose-Capillary: 147 mg/dL — ABNORMAL HIGH (ref 70–99)

## 2022-07-05 LAB — CBC
HCT: 34 % — ABNORMAL LOW (ref 39.0–52.0)
Hemoglobin: 10.6 g/dL — ABNORMAL LOW (ref 13.0–17.0)
MCH: 27.2 pg (ref 26.0–34.0)
MCHC: 31.2 g/dL (ref 30.0–36.0)
MCV: 87.2 fL (ref 80.0–100.0)
Platelets: 200 10*3/uL (ref 150–400)
RBC: 3.9 MIL/uL — ABNORMAL LOW (ref 4.22–5.81)
RDW: 14.8 % (ref 11.5–15.5)
WBC: 8.7 10*3/uL (ref 4.0–10.5)
nRBC: 0 % (ref 0.0–0.2)

## 2022-07-05 LAB — COOXEMETRY PANEL
Carboxyhemoglobin: 2.8 % — ABNORMAL HIGH (ref 0.5–1.5)
Methemoglobin: <0.7 % (ref 0.0–1.5)
O2 Saturation: 80.2 %
Total hemoglobin: 11.2 g/dL — ABNORMAL LOW (ref 12.0–16.0)

## 2022-07-05 LAB — BASIC METABOLIC PANEL
Anion gap: 12 (ref 5–15)
BUN: 9 mg/dL (ref 6–20)
CO2: 30 mmol/L (ref 22–32)
Calcium: 7.9 mg/dL — ABNORMAL LOW (ref 8.9–10.3)
Chloride: 94 mmol/L — ABNORMAL LOW (ref 98–111)
Creatinine, Ser: 0.8 mg/dL (ref 0.61–1.24)
GFR, Estimated: >60 mL/min (ref >60)
Glucose, Bld: 100 mg/dL — ABNORMAL HIGH (ref 70–99)
Potassium: 3.6 mmol/L (ref 3.5–5.1)
Sodium: 136 mmol/L (ref 135–145)

## 2022-07-05 LAB — CULTURE, BLOOD (ROUTINE X 2)
Culture: NO GROWTH
Culture: NO GROWTH

## 2022-07-05 LAB — HEPARIN LEVEL (UNFRACTIONATED): Heparin Unfractionated: 0.54 IU/mL (ref 0.30–0.70)

## 2022-07-05 MED ORDER — POTASSIUM CHLORIDE CRYS ER 20 MEQ PO TBCR
40.0000 meq | EXTENDED_RELEASE_TABLET | Freq: Once | ORAL | Status: AC
  Administered 2022-07-05: 40 meq via ORAL
  Filled 2022-07-05: qty 2

## 2022-07-05 MED ORDER — APIXABAN 5 MG PO TABS
5.0000 mg | ORAL_TABLET | Freq: Two times a day (BID) | ORAL | Status: DC
  Administered 2022-07-05 – 2022-07-07 (×5): 5 mg via ORAL
  Filled 2022-07-05 (×5): qty 1

## 2022-07-05 MED ORDER — FUROSEMIDE 10 MG/ML IJ SOLN
60.0000 mg | Freq: Once | INTRAMUSCULAR | Status: AC
  Administered 2022-07-05: 60 mg via INTRAVENOUS
  Filled 2022-07-05: qty 6

## 2022-07-05 MED ORDER — SPIRONOLACTONE 12.5 MG HALF TABLET
12.5000 mg | ORAL_TABLET | Freq: Every day | ORAL | Status: DC
  Administered 2022-07-05 – 2022-07-07 (×3): 12.5 mg via ORAL
  Filled 2022-07-05 (×3): qty 1

## 2022-07-05 MED ORDER — TORSEMIDE 20 MG PO TABS
20.0000 mg | ORAL_TABLET | Freq: Every day | ORAL | Status: DC
  Administered 2022-07-06 – 2022-07-07 (×2): 20 mg via ORAL
  Filled 2022-07-05 (×2): qty 1

## 2022-07-05 NOTE — Progress Notes (Addendum)
Advanced Heart Failure Rounding Note  PCP-Cardiologist: None   Subjective:   Patient admitted with cardiogenic shock 2/2 noncompliance. CT showed RLL PE. S/p TPA 07/01/22: AFL RVR. Started on IV amio>>NSR.   Off NE. CO-OX 80% on milrinone 0.25.  CVP 9-10  No complaints. Sitting up in chair. Ambulated around the unit yesterday.   Objective:   Echo 07/01/22: EF 20% RV severely down Large LV clot   Weight Range: 124 kg Body mass index is 35.1 kg/m.   Vital Signs:   Temp:  [97.7 F (36.5 C)-99 F (37.2 C)] 97.7 F (36.5 C) (06/27 0400) Pulse Rate:  [79-108] 99 (06/27 0700) Resp:  [13-32] 19 (06/27 0700) BP: (83-124)/(54-85) 105/63 (06/27 0630) SpO2:  [82 %-100 %] 100 % (06/27 0700) Weight:  [124 kg] 124 kg (06/27 0500) Last BM Date : 07/03/22  Weight change: Filed Weights   07/03/22 0500 07/04/22 0500 07/05/22 0500  Weight: 124.1 kg 124.4 kg 124 kg   Intake/Output:   Intake/Output Summary (Last 24 hours) at 07/05/2022 0810 Last data filed at 07/05/2022 0700 Gross per 24 hour  Intake 797.6 ml  Output 2750 ml  Net -1952.4 ml     Physical Exam  CVP 9-10 General:  Well appearing. Sitting up in chair. HEENT: Normal Neck: JVP 8-10. L internal jugular CVC Cor: PMI nondisplaced. Regular rate & rhythm. No rubs, gallops, 2/6 MR murmur Lungs: clear Abdomen: soft, nontender, nondistended.  Extremities: no cyanosis, clubbing, rash, edema Neuro: alert & orientedx3. Affect pleasant    Telemetry   SR 80s-90s   Labs    CBC Recent Labs    07/04/22 0504 07/05/22 0500  WBC 9.7 8.7  HGB 11.0* 10.6*  HCT 34.7* 34.0*  MCV 86.1 87.2  PLT 204 200   Basic Metabolic Panel Recent Labs    96/04/54 0508 07/04/22 0504 07/05/22 0500  NA 131* 131* 136  K 3.8 3.2* 3.6  CL 96* 93* 94*  CO2 26 30 30   GLUCOSE 133* 130* 100*  BUN 24* 14 9  CREATININE 0.97 0.79 0.80  CALCIUM 7.7* 7.6* 7.9*  MG 2.0  --   --    Liver Function Tests Recent Labs    07/03/22 0501  07/04/22 0504  AST 307* 157*  ALT 804* 596*  ALKPHOS 71 64  BILITOT 1.7* 1.8*  PROT 6.2* 6.0*  ALBUMIN 2.7* 2.5*   No results for input(s): "LIPASE", "AMYLASE" in the last 72 hours. Cardiac Enzymes No results for input(s): "CKTOTAL", "CKMB", "CKMBINDEX", "TROPONINI" in the last 72 hours.  BNP: BNP (last 3 results) Recent Labs    06/30/22 1810 07/01/22 0337  BNP 2,206.2* 1,884.1*   ProBNP (last 3 results) Recent Labs    01/19/22 1550  PROBNP 540*   D-Dimer No results for input(s): "DDIMER" in the last 72 hours. Hemoglobin A1C No results for input(s): "HGBA1C" in the last 72 hours. Fasting Lipid Panel No results for input(s): "CHOL", "HDL", "LDLCALC", "TRIG", "CHOLHDL", "LDLDIRECT" in the last 72 hours. Thyroid Function Tests No results for input(s): "TSH", "T4TOTAL", "T3FREE", "THYROIDAB" in the last 72 hours.  Invalid input(s): "FREET3"  Other results:  Imaging   No results found.  Medications:     Scheduled Medications:  amiodarone  200 mg Oral BID   Chlorhexidine Gluconate Cloth  6 each Topical Daily   digoxin  0.125 mg Oral Daily   docusate sodium  100 mg Oral BID   empagliflozin  10 mg Oral Daily   insulin aspart  0-15 Units Subcutaneous Q4H   pantoprazole  40 mg Oral Daily   polyethylene glycol  17 g Oral Daily   torsemide  20 mg Oral Daily    Infusions:  sodium chloride Stopped (07/04/22 0200)   heparin 2,300 Units/hr (07/05/22 0754)   milrinone 0.25 mcg/kg/min (07/05/22 0700)   norepinephrine (LEVOPHED) Adult infusion Stopped (07/04/22 0934)    PRN Medications: mouth rinse, traMADol  Patient Profile  Jerry Saunders is a 52 y.o. male with systolic HF due to [facility], DM2, obesity, PVCs and noncompliance. Admitted with cardiogenic shock and recurrent PE.  Assessment/Plan  1. Acute on chronic systolic HF due to NICM -> cardiogenic shock - Echo 6/23: EF < 20% RV severely HK - Cath 6/23: No CAD. RA 16 PA 67/43 (54) PCW 27 Fick 5.8/2.3 - cMRI  06/23: EF 20% RV 27% Minimal LGE - Echo 07/01/22: EF 20% RV severely down. Large LV clot. Moderate to severe MR. Moderate to severe TR. - Suspect PE is incidental due to severe RV stasis. Main issues is advanced HF - Now off NE. CO-OX 80% on milrinone 0.25. Wean milrinone to 0.125.  - CVP 9-10. Give 60 mg lasix IV and start po Torsemide 20 mg daily (10 mg daily at home) tomorrow  - Continue Jardiance 10 mg daily - Continue digoxin 0.125mg  daily - Start spiro 12.5 mg daily - No beta blocker with low-output HF - BP too soft for ARB/ARNi - May consider RHC prior to discharge if struggles with inotrope wean - Ideally needs transplant but non-compliance major issue. Will need to follow closely as outpatient.   2. Recurrent PE - CT 06/30/22 - Right lower lobe proximal segmental pulmonary embolus. Minimal clot burden with no evidence of right heart strain.  - s/p t-PA at 330a on 06/30/22 - Suspect PE is incidental due to severe RV stasis. Main issues is advanced HF - continue heparin for now - LE venous duplex negative    3. AFL with RVR - SR today. Continue po amiodarone 200 mg BID. Decrease to 200 mg daily at discharge - continue heparin - eventual DOAC once certain no additional procedures   4. LV thrombus - anticoagulation as above  5. Valvular heart disease - Echo this admit with EF  < 20%, moderate to severe MR and moderate to severe TR - Suspect functional   6. DM2 - SSI - SGLT2i   7. Shock liver -LFTs improving - continue hemodynamic support   8. Morbid obesity - Body mass index is 35.1 kg/m.    9. Non-compliance -Had along discussion about this. Medication cost playing a role. Will provide copay cards. -Meds to [facility] pharmacy at discharge   Can transfer to [facility].  Length of Stay: 4  FINCH, LINDSAY N, PA-C  07/05/2022, 8:10 AM  Advanced Heart Failure Team Pager [phone] (M-F; 7a - 5p)  Please contact [facility] for night-coverage after hours (5p -7a ) and  weekends on [email or web address]   Patient seen and examined with the above-signed Advanced Practice Provider and/or Housestaff. I personally reviewed laboratory data, imaging studies and relevant notes. I independently examined the patient and formulated the important aspects of the plan. I have edited the note to reflect any of my changes or salient points. I have personally discussed the plan with the patient and/or family.  Now of NE. BP stable. Co-ox 80% on milrinone 0.25  CVP 9-10  Feels ok. Weak. No CP or SOB.   Remains in NSR on po amio  General:  Weak appearing. No resp difficulty HEENT: normal Neck: supple. JVP appearing . Carotids 2+ bilat; no bruits. No lymphadenopathy or thryomegaly appreciated. Cor: Regular rate & rhythm. No rubs, gallops or murmurs. Lungs: clear Abdomen: soft, nontender, nondistended. No hepatosplenomegaly. No bruits or masses. Good bowel sounds. Extremities: no cyanosis, clubbing, rash, tr-1+ edema Neuro: alert & orientedx3, cranial nerves grossly intact. moves all 4 extremities w/o difficulty. Affect pleasant  Wean milrinone. Continue to titrate GDMT as tolerated. Move to [facility]. No need for RHC at this time   Continue Eliquis for PE.   Ambulate.  Arvilla Meres, MD  10:02 AM

## 2022-07-05 NOTE — Discharge Summary (Signed)
Advanced Heart Failure Team  Discharge Summary   Patient ID: Jerry Saunders MRN: [id number], DOB/AGE: December 01, 1970 52 y.o. Admit date: 06/30/2022 D/C date:     07/07/2022   Primary Discharge Diagnoses:  Cardiogenic shock Acute on chronic systolic CHF PE AFL with RVR  Secondary Discharge Diagnoses:  LV thrombus DM II Shock liver Morbid obesity  Hospital Course:   Jerry Saunders is a 52 y.o. male with systolic HF due to [facility], DM2.obesity, PVCs and noncompliance.    Diagnosed with HF in 6/23. - Echo 6/23: EF < 20% RV severely HK - Cath 6/23 No CAD. RA 16 PA 67/43 (54) PCW 27 Fick 5.8/2.3 - cMRI EF 20% RV 27% Minimal LGE  He has followed by Dr. Odis Hollingshead in the community. He stopped his medications on several occasions.   Admitted 06/30/22 with cardiogenic shock and PE in setting of noncompliance. Echo with EF 20% RV severely down Large LV clot. Advanced Heart Failure consulted to assist with management.  He required inotrope support and diuresed with IV lasix. Course c/b AFL with RVR. Converted to SR with IV amiodarone. Inotropes weaned and GDMT added as tolerated.   Close follow-up in HF clinic arranged. Meds sent to [facility] pharmacy.  Please see below for hospital course by problem.  Hospital Course by Problem: 1. Acute on chronic systolic HF due to NICM -> cardiogenic shock - Echo 6/23: EF < 20% RV severely HK - Cath 6/23: No CAD. RA 16 PA 67/43 (54) PCW 27 Fick 5.8/2.3 - cMRI 06/23: EF 20% RV 27% Minimal LGE - Echo 07/01/22: EF 20% RV severely down. Large LV clot. Moderate to severe MR. Moderate to severe TR. - Suspect PE is incidental due to severe RV stasis. Main issues is advanced HF - Off milrinone, co-ox acceptable at 59%.   - Volume status looks stable.  Continue torsemide 20 mg daily.  - Continue Jardiance 10 mg daily - Continue digoxin 0.125mg  daily, level ok.  - Continue spiro 12.5 mg daily - Can add low dose losartan 12.5 daily.  - No beta blocker with low-output HF -  Ideally needs transplant but non-compliance major issue. Will need to follow closely as outpatient.   2. Recurrent PE - CT 06/30/22 - Right lower lobe proximal segmental pulmonary embolus. Minimal clot burden with no evidence of right heart strain.  - s/p t-PA at 330 am on 06/30/22 - Suspect PE is incidental due to severe RV stasis. Main issues is advanced HF - Continue Eliquis 5 bid.  - LE venous duplex negative    3. AFL with RVR - SR today. Continue po amiodarone 200 mg BID. Decrease to 200 mg daily at discharge - Continue Eliquis   4. LV thrombus - anticoagulation as above   5. Valvular heart disease - Echo this admit with EF  < 20%, moderate to severe MR and moderate to severe TR - Suspect functional   6. DM2 - A1c 6.7 in 04/24 - SSI - SGLT2i   7. Shock liver -LFTs improved   8. Morbid obesity - Body mass index is 34.36 kg/m.    9. Possible sleep apnea:  - per RN, O2 sats dropped into the 70s overnight, raising concern for OSA - further eval as outpt  10. Non-compliance -Discussed taking meds and following up in CHF clinic.  -Meds to [facility] pharmacy at discharge   Can go home today.  Followup CHF clinic.  Meds for home: empagliflozin 10 daily, losartan 12.5 daily, spironolactone 12.5 daily, digoxin 0.125  daily, torsemide 20 daily, Eliquis 5 bid, amiodarone 200 daily      Discharge Weight: 267.64 lbs, 121.4 kg Discharge Vitals: Blood pressure 100/75, pulse (!) 103, temperature 98.7 F (37.1 C), temperature source Oral, resp. rate 16, height 6\' 2"  (1.88 m), weight 121.4 kg, SpO2 93 %.  Labs: Lab Results  Component Value Date   WBC 6.6 07/07/2022   HGB 10.9 (L) 07/07/2022   HCT 36.2 (L) 07/07/2022   MCV 88.9 07/07/2022   PLT 263 07/07/2022    Recent Labs  Lab 07/04/22 0504 07/05/22 0500 07/07/22 0412  NA 131*   < > 136  K 3.2*   < > 4.3  CL 93*   < > 99  CO2 30   < > 30  BUN 14   < > 10  CREATININE 0.79   < > 0.82  CALCIUM 7.6*   < > 8.4*  PROT 6.0*   --   --   BILITOT 1.8*  --   --   ALKPHOS 64  --   --   ALT 596*  --   --   AST 157*  --   --   GLUCOSE 130*   < > 109*   < > = values in this interval not displayed.   Lab Results  Component Value Date   CHOL 116 01/19/2022   HDL 44 01/19/2022   LDLCALC 58 01/19/2022   TRIG 64 01/19/2022   BNP (last 3 results) Recent Labs    06/30/22 1810 07/01/22 0337  BNP 2,206.2* 1,884.1*    ProBNP (last 3 results) Recent Labs    01/19/22 1550  PROBNP 540*     Diagnostic Studies/Procedures   ECHO: 07/01/2022  1. Left ventricular thrombus 2.7 x 1.6cm (largest dimension). Left ventricular ejection fraction, by estimation, is <20%. The left ventricle has severely decreased function. The left ventricle demonstrates global hypokinesis. The left ventricular  internal cavity size was severely dilated. Left ventricular diastolic parameters are consistent with Grade III diastolic dysfunction (restrictive). There is the interventricular septum is flattened in systole and diastole, consistent with right ventricular pressure and volume overload.   2. Degree of pulmonary hypertension is underestimated due to dialted RA and RV size. Right ventricular systolic function is severely reduced. The  right ventricular size is severely enlarged. There is moderately elevated pulmonary artery systolic  pressure. The estimated right ventricular systolic pressure is 49.3 mmHg.   3. Left atrial size was severely dilated.   4. Right atrial size was severely dilated.   5. The mitral valve is degenerative. Moderate to severe mitral valve regurgitation. No evidence of mitral stenosis.   6. Tricuspid valve regurgitation is moderate to severe.   7. The aortic valve is tricuspid. Aortic valve regurgitation is not visualized. No aortic stenosis is present.   8. The inferior vena cava is dilated in size with <50% respiratory variability, suggesting right atrial pressure of 15 mmHg.   Comparison(s): Prior echo  06/19/2021: LVEF <20%, global HK, LV dilated, RV dilated and reduced function, RAP .   Conclusion(s)/Recommendation(s): Findings consistent with Dilated biventricular heart failure. Left ventricular thrombus present as noted above.   LE DOPPLERS: 07/02/2022 Summary:  RIGHT:  - There is no evidence of deep vein thrombosis in the lower extremity.  However, portions of this examination were limited- see technologist  comments above.    - No cystic structure found in the popliteal fossa.    LEFT:  - There is no evidence of deep vein  thrombosis in the lower extremity.  However, portions of this examination were limited- see technologist  comments above.    - No cystic structure found in the popliteal fossa.      Discharge Medications   Allergies as of 07/07/2022   No Known Allergies      Medication List     STOP taking these medications    Entresto 49-51 MG Generic drug: sacubitril-valsartan   metoprolol succinate 50 MG 24 hr tablet Commonly known as: Toprol XL       TAKE these medications    amiodarone 200 MG tablet Commonly known as: Pacerone Take 1 tablet (200 mg total) by mouth daily.   digoxin 0.125 MG tablet Commonly known as: LANOXIN Take 1 tablet (0.125 mg total) by mouth daily.   Eliquis 5 MG Tabs tablet Generic drug: apixaban Take 1 tablet (5 mg total) by mouth 2 (two) times daily.   Jardiance 10 MG Tabs tablet Generic drug: empagliflozin Take 1 tablet (10 mg total) by mouth daily.   losartan 25 MG tablet Commonly known as: COZAAR Take 0.5 tablets (12.5 mg total) by mouth daily. What changed:  how much to take when to take this   spironolactone 25 MG tablet Commonly known as: ALDACTONE Take 0.5 tablets (12.5 mg total) by mouth daily. What changed:  how much to take when to take this   torsemide 20 MG tablet Commonly known as: DEMADEX Take 1 tablet (20 mg total) by mouth daily. What changed:  medication strength how much to  take when to take this        Disposition   The patient will be discharged in stable condition to home. Discharge Instructions     Amb Referral to Cardiac Rehabilitation   Complete by: As directed    Diagnosis: Heart Failure (see criteria below if ordering Phase II)   Heart Failure Type: Chronic Systolic & Diastolic   After initial evaluation and assessments completed: Virtual Based Care may be provided alone or in conjunction with Phase 2 Cardiac Rehab based on patient barriers.: Yes   Intensive Cardiac Rehabilitation (ICR) [facility] location only OR Traditional Cardiac Rehabilitation (TCR) *If criteria for ICR are not met will enroll in TCR [facility] only): Yes       Follow-up Information     [facility] Follow up on 07/18/2022.   Specialty: Cardiology Why: Advanced Heart Failure Clinic 10:30 AM Contact information: [address] [id number] mc [address] [place] [address] [phone]        Garnette Gunner, MD Follow up.   Specialty: Family Medicine Why: appt scheduled 07/23/2022 at 2:20 pm, please call to reschedule if you are unable to keep appt Contact information: [address] [place] [address] [phone]                   Duration of Discharge Encounter: Greater than 35 minutes   SignedTheodore Demark  07/07/2022, 8:42 AM

## 2022-07-05 NOTE — Progress Notes (Addendum)
ANTICOAGULATION CONSULT NOTE -   Pharmacy Consult for heparin > Eliquis Indication: pulmonary embolus/DVT/LV thrombus  No Known Allergies  Patient Measurements: Height: 6\' 2"  (188 cm) Weight: 124 kg (273 lb 5.9 oz) IBW/kg (Calculated) : 82.2 Heparin Dosing Weight: 110kg  Vital Signs: Temp: 97.7 F (36.5 C) (06/27 0400) Temp Source: Oral (06/27 0400) BP: 105/63 (06/27 0630) Pulse Rate: 99 (06/27 0700)  Labs: Recent Labs    07/03/22 0459 07/03/22 0508 07/04/22 0504 07/05/22 0500  HGB 10.7*  --  11.0* 10.6*  HCT 34.8*  --  34.7* 34.0*  PLT 191  --  204 200  HEPARINUNFRC 0.55  --  0.44 0.54  CREATININE  --  0.97 0.79 0.80     Estimated Creatinine Clearance: 154.5 mL/min (by C-G formula based on SCr of 0.8 mg/dL).  Assessment: 52 yo male with history of DVT found to have acute PE  and large LV thrombus in setting of acute decompensated biventricular HF. Clot burden was low,  however TPA 100mg  iv x1 was administered due to hemodynamic compromise. Heparin was resumed 6/23 AM. Patient prescribed Eliquis PTA for DVT,  but was not taking medications.   Heparin level therapeutic (0.54) on heparin infusion at 2300 units/hr. No bleeding noted. CBC stable   Goal of Therapy:  Heparin level 0.3-0.7 units/ml Monitor platelets by anticoagulation protocol: Yes   Plan:  Continue IV heparin 2300 units/hr  Daily heparin level and CBC Monitor s/s bleeding  Reece Leader, Colon Flattery, [facility] Clinical Pharmacist  07/05/2022 8:35 AM   [facility] pharmacy phone numbers are listed on [email or web address]  Addendum: Transitioning to Eliquis today.  Per discussion with Dr. Gala Romney, will start with 5 mg BID and not 10 mg loading dose since he had TNK and 5 days of IV heparin. Will give copay card to patient prior to discharge.  Reece Leader, Colon Flattery, [facility] Clinical Pharmacist  07/05/2022 1:04 PM   [facility] pharmacy phone numbers are listed on [email or web address]

## 2022-07-06 ENCOUNTER — Telehealth (HOSPITAL_COMMUNITY): Payer: Self-pay | Admitting: Pharmacy Technician

## 2022-07-06 ENCOUNTER — Other Ambulatory Visit (HOSPITAL_COMMUNITY): Payer: Self-pay

## 2022-07-06 DIAGNOSIS — R579 Shock, unspecified: Secondary | ICD-10-CM | POA: Diagnosis not present

## 2022-07-06 LAB — BASIC METABOLIC PANEL
Anion gap: 7 (ref 5–15)
BUN: 9 mg/dL (ref 6–20)
CO2: 30 mmol/L (ref 22–32)
Calcium: 8 mg/dL — ABNORMAL LOW (ref 8.9–10.3)
Chloride: 98 mmol/L (ref 98–111)
Creatinine, Ser: 0.71 mg/dL (ref 0.61–1.24)
GFR, Estimated: >60 mL/min (ref >60)
Glucose, Bld: 95 mg/dL (ref 70–99)
Potassium: 3.8 mmol/L (ref 3.5–5.1)
Sodium: 135 mmol/L (ref 135–145)

## 2022-07-06 LAB — COOXEMETRY PANEL
Carboxyhemoglobin: 2.5 % — ABNORMAL HIGH (ref 0.5–1.5)
Methemoglobin: 0.7 % (ref 0.0–1.5)
O2 Saturation: 84.8 %
Total hemoglobin: 11.2 g/dL — ABNORMAL LOW (ref 12.0–16.0)

## 2022-07-06 LAB — CBC
HCT: 35.1 % — ABNORMAL LOW (ref 39.0–52.0)
Hemoglobin: 10.8 g/dL — ABNORMAL LOW (ref 13.0–17.0)
MCH: 27.6 pg (ref 26.0–34.0)
MCHC: 30.8 g/dL (ref 30.0–36.0)
MCV: 89.5 fL (ref 80.0–100.0)
Platelets: 223 10*3/uL (ref 150–400)
RBC: 3.92 MIL/uL — ABNORMAL LOW (ref 4.22–5.81)
RDW: 15 % (ref 11.5–15.5)
WBC: 6.8 10*3/uL (ref 4.0–10.5)
nRBC: 0 % (ref 0.0–0.2)

## 2022-07-06 LAB — GLUCOSE, CAPILLARY
Glucose-Capillary: 96 mg/dL (ref 70–99)
Glucose-Capillary: 129 mg/dL — ABNORMAL HIGH (ref 70–99)
Glucose-Capillary: 122 mg/dL — ABNORMAL HIGH (ref 70–99)
Glucose-Capillary: 101 mg/dL — ABNORMAL HIGH (ref 70–99)
Glucose-Capillary: 175 mg/dL — ABNORMAL HIGH (ref 70–99)

## 2022-07-06 MED ORDER — EMPAGLIFLOZIN 10 MG PO TABS
10.0000 mg | ORAL_TABLET | Freq: Every day | ORAL | 5 refills | Status: DC
  Filled 2022-07-06 – 2022-08-10 (×2): qty 30, 30d supply, fill #0
  Filled 2022-09-04: qty 30, 30d supply, fill #1
  Filled 2022-10-13 – 2022-11-14 (×6): qty 30, 30d supply, fill #2

## 2022-07-06 MED ORDER — APIXABAN 5 MG PO TABS
5.0000 mg | ORAL_TABLET | Freq: Two times a day (BID) | ORAL | 5 refills | Status: DC
  Filled 2022-07-06: qty 60, 30d supply, fill #0

## 2022-07-06 MED ORDER — SPIRONOLACTONE 25 MG PO TABS
12.5000 mg | ORAL_TABLET | Freq: Every day | ORAL | 5 refills | Status: DC
  Filled 2022-07-06 – 2022-08-10 (×2): qty 30, 60d supply, fill #0

## 2022-07-06 MED ORDER — AMIODARONE HCL 200 MG PO TABS
200.0000 mg | ORAL_TABLET | Freq: Every day | ORAL | 3 refills | Status: DC
  Filled 2022-07-06 – 2022-08-10 (×2): qty 30, 30d supply, fill #0
  Filled 2022-09-04: qty 30, 30d supply, fill #1
  Filled 2022-10-13 – 2022-10-26 (×2): qty 30, 30d supply, fill #2

## 2022-07-06 MED ORDER — POTASSIUM CHLORIDE CRYS ER 20 MEQ PO TBCR
40.0000 meq | EXTENDED_RELEASE_TABLET | Freq: Once | ORAL | Status: AC
  Administered 2022-07-06: 40 meq via ORAL
  Filled 2022-07-06: qty 2

## 2022-07-06 MED ORDER — TORSEMIDE 20 MG PO TABS
20.0000 mg | ORAL_TABLET | Freq: Every day | ORAL | 3 refills | Status: DC
  Filled 2022-07-06 – 2022-08-10 (×2): qty 30, 30d supply, fill #0
  Filled 2022-09-04: qty 30, 30d supply, fill #1
  Filled 2022-10-13 – 2022-10-26 (×2): qty 30, 30d supply, fill #2

## 2022-07-06 MED ORDER — DIGOXIN 125 MCG PO TABS
0.1250 mg | ORAL_TABLET | Freq: Every day | ORAL | 3 refills | Status: DC
  Filled 2022-07-06 – 2022-08-10 (×2): qty 30, 30d supply, fill #0
  Filled 2022-09-04: qty 30, 30d supply, fill #1
  Filled 2022-10-13 – 2022-10-26 (×2): qty 30, 30d supply, fill #2

## 2022-07-06 NOTE — Progress Notes (Addendum)
Advanced Heart Failure Rounding Note  PCP-Cardiologist: None   Subjective:   Patient admitted with cardiogenic shock 2/2 noncompliance. CT showed RLL PE. S/p TPA 07/01/22: AFL RVR. Started on IV amio>>NSR.    CO-OX 85% on milrinone 0.125.  3.8L UOP last 24 hrs with one dose IV lasix. CVP 5.   No complaints. Sitting up in chair eating breakfast.  Objective:   Echo 07/01/22: EF 20% RV severely down Large LV clot   Weight Range: 122.6 kg Body mass index is 34.7 kg/m.   Vital Signs:   Temp:  [98 F (36.7 C)-98.7 F (37.1 C)] 98.3 F (36.8 C) (06/28 0300) Pulse Rate:  [83-101] 90 (06/28 0700) Resp:  [0-33] 21 (06/28 0700) BP: (76-133)/(55-96) 97/73 (06/28 0700) SpO2:  [91 %-100 %] 99 % (06/28 0700) Weight:  [122.6 kg] 122.6 kg (06/28 0500) Last BM Date : 07/04/22  Weight change: Filed Weights   07/04/22 0500 07/05/22 0500 07/06/22 0500  Weight: 124.4 kg 124 kg 122.6 kg   Intake/Output:   Intake/Output Summary (Last 24 hours) at 07/06/2022 0741 Last data filed at 07/06/2022 0700 Gross per 24 hour  Intake 257.11 ml  Output 3875 ml  Net -3617.89 ml     Physical Exam  CVP 5 General:  Well appearing. Sitting up in chair. HEENT: normal Neck: supple. no JVD. Carotids 2+ bilat; no bruits. L internal jugular CVC Cor: PMI nondisplaced. Regular rate & rhythm. No rubs, gallops, 2/6 MR murmur. Lungs: clear Abdomen: soft, nontender, nondistended.  Extremities: no cyanosis, clubbing, rash, 1+ edema Neuro: alert & orientedx3. Affect pleasant    Telemetry   SR 80s-90s   Labs    CBC Recent Labs    07/05/22 0500 07/06/22 0500  WBC 8.7 6.8  HGB 10.6* 10.8*  HCT 34.0* 35.1*  MCV 87.2 89.5  PLT 200 223   Basic Metabolic Panel Recent Labs    16/10/96 0500 07/06/22 0500  NA 136 135  K 3.6 3.8  CL 94* 98  CO2 30 30  GLUCOSE 100* 95  BUN 9 9  CREATININE 0.80 0.71  CALCIUM 7.9* 8.0*   Liver Function Tests Recent Labs    07/04/22 0504  AST 157*   ALT 596*  ALKPHOS 64  BILITOT 1.8*  PROT 6.0*  ALBUMIN 2.5*   No results for input(s): "LIPASE", "AMYLASE" in the last 72 hours. Cardiac Enzymes No results for input(s): "CKTOTAL", "CKMB", "CKMBINDEX", "TROPONINI" in the last 72 hours.  BNP: BNP (last 3 results) Recent Labs    06/30/22 1810 07/01/22 0337  BNP 2,206.2* 1,884.1*   ProBNP (last 3 results) Recent Labs    01/19/22 1550  PROBNP 540*   D-Dimer No results for input(s): "DDIMER" in the last 72 hours. Hemoglobin A1C No results for input(s): "HGBA1C" in the last 72 hours. Fasting Lipid Panel No results for input(s): "CHOL", "HDL", "LDLCALC", "TRIG", "CHOLHDL", "LDLDIRECT" in the last 72 hours. Thyroid Function Tests No results for input(s): "TSH", "T4TOTAL", "T3FREE", "THYROIDAB" in the last 72 hours.  Invalid input(s): "FREET3"  Other results:  Imaging   No results found.  Medications:     Scheduled Medications:  amiodarone  200 mg Oral BID   apixaban  5 mg Oral BID   Chlorhexidine Gluconate Cloth  6 each Topical Daily   digoxin  0.125 mg Oral Daily   docusate sodium  100 mg Oral BID   empagliflozin  10 mg Oral Daily   insulin aspart  0-15 Units Subcutaneous Q4H  pantoprazole  40 mg Oral Daily   polyethylene glycol  17 g Oral Daily   spironolactone  12.5 mg Oral Daily   torsemide  20 mg Oral Daily    Infusions:  sodium chloride Stopped (07/04/22 0200)   milrinone 0.125 mcg/kg/min (07/05/22 2046)   norepinephrine (LEVOPHED) Adult infusion Stopped (07/04/22 0934)    PRN Medications: mouth rinse, traMADol  Patient Profile  Jerry Saunders is a 52 y.o. male with systolic HF due to [facility], DM2, obesity, PVCs and noncompliance. Admitted with cardiogenic shock and recurrent PE.  Assessment/Plan  1. Acute on chronic systolic HF due to NICM -> cardiogenic shock - Echo 6/23: EF < 20% RV severely HK - Cath 6/23: No CAD. RA 16 PA 67/43 (54) PCW 27 Fick 5.8/2.3 - cMRI 06/23: EF 20% RV 27% Minimal  LGE - Echo 07/01/22: EF 20% RV severely down. Large LV clot. Moderate to severe MR. Moderate to severe TR. - Suspect PE is incidental due to severe RV stasis. Main issues is advanced HF - Now off NE. CO-OX 85%. Stop milrinone.  - CVP 5.  Start po Torsemide 20 mg daily (10 mg daily at home) tomorrow  - Continue Jardiance 10 mg daily - Continue digoxin 0.125mg  daily, check digoxin level - Continue spiro 12.5 mg daily - No beta blocker with low-output HF - BP too soft for ARB/ARNi - Ideally needs transplant but non-compliance major issue. Will need to follow closely as outpatient.   2. Recurrent PE - CT 06/30/22 - Right lower lobe proximal segmental pulmonary embolus. Minimal clot burden with no evidence of right heart strain.  - s/p t-PA at 330a on 06/30/22 - Suspect PE is incidental due to severe RV stasis. Main issues is advanced HF - Continue Eliquis - LE venous duplex negative    3. AFL with RVR - SR today. Continue po amiodarone 200 mg BID. Decrease to 200 mg daily at discharge - Continue Eliquis   4. LV thrombus - anticoagulation as above  5. Valvular heart disease - Echo this admit with EF  < 20%, moderate to severe MR and moderate to severe TR - Suspect functional   6. DM2 - A1c 6.7 in 04/24 - SSI - SGLT2i   7. Shock liver -LFTs imorived   8. Morbid obesity - Body mass index is 34.7 kg/m.    9. Non-compliance -Had along discussion about this. Medication cost playing a role. Will provide copay cards. -Meds to [facility] pharmacy at discharge   Stable for transfer to stepdown, waiting for bed.   Can likely discharge over the weekend if tolerates inotrope wean. F/u in clinic arranged.   Length of Stay: 5  FINCH, LINDSAY N, PA-C  07/06/2022, 7:41 AM  Advanced Heart Failure Team Pager [phone] (M-F; 7a - 5p)  Please contact [facility] for night-coverage after hours (5p -7a ) and weekends on [email or web address]   Patient seen and examined with the above-signed Advanced  Practice Provider and/or Housestaff. I personally reviewed laboratory data, imaging studies and relevant notes. I independently examined the patient and formulated the important aspects of the plan. I have edited the note to reflect any of my changes or salient points. I have personally discussed the plan with the patient and/or family.  Co-ox stable of milrinone. BP soft but stable. CVP 5    Feels good. Walking halls.   General:  Well appearing. No resp difficulty HEENT: normal Neck: supple. no JVD. Carotids 2+ bilat; no bruits. No lymphadenopathy or thryomegaly appreciated. Cor:  PMI nondisplaced. Regular rate & rhythm. No rubs, gallops or murmurs. Lungs: clear Abdomen: soft, nontender, nondistended. No hepatosplenomegaly. No bruits or masses. Good bowel sounds. Extremities: no cyanosis, clubbing, rash, edema Neuro: alert & orientedx3, cranial nerves grossly intact. moves all 4 extremities w/o difficulty. Affect pleasant  Can go to 2C. Continue to titrate GDMT as tolerated. Continue Eliquis. Will get meds from TOC. Suspect should be stable for d/c tomorrow with close HF f/u.   Arvilla Meres, MD  10:35 AM

## 2022-07-06 NOTE — Telephone Encounter (Signed)
Pharmacy Patient Advocate Encounter  Received notification from [employer] that Prior Authorization for Jardiance 10MG  tablets  has been APPROVED from 07/06/2022 to 07/06/2023.Marland Kitchen  PA #/Case ID/Reference #: [id number]  Copay is $45.00

## 2022-07-06 NOTE — Progress Notes (Addendum)
CARDIAC REHAB PHASE I   PRE:  Rate/Rhythm: 95 NSR  BP:  Sitting: 86/69      SaO2: 95 RA  MODE:  Ambulation: 740 ft   AD:   rollator  POST:  Rate/Rhythm: 102 ST  BP:  Sitting: 103/71      SaO2: 95 RA  Pt amb with standby assistance, pt denies CP and SOB during amb and was returned to room w/o complaint.   Sats stable 92-96 on RA,   Pt returned to chair after walk and educated on CRP2. Pt is interested in program.   Referral placed to: [facility] under cardiologist Dr. Tessa Lerner Qualifying Dx: 6/22 acute on chronic combined HF.      Faustino Congress  ACSM-CEP 9:49 AM 07/06/2022    Service time is from 0912 to 0953.

## 2022-07-06 NOTE — Progress Notes (Signed)
CARDIOLOGY 07/06/22  Patient's name: Allison Corum.   MRN: [id number].    DOB: June 10, 1970   Interaction regarding this patient's care today: Following the patient peripherally.  He is sitting up in chair.  UOP 3.8L, co-ox 85% Off Levo, Milrinone off SR on telemetry w/ minimal ectopy.  Denies CP or dyspnea  Ambulating on floor.  Currently on digoxin, jardiance, aldactone.  Due to soft BP hold Entresto / ARB/ ACEi.  Due to BB low-output LVEF.   Given his young age and biventricular failure he needs close outpatient follow up.  If financial resources are limited recommended that he continue to follow up w/ Dr. Gala Romney for now as he may qualify for advance HF therapy if his compliance improves. However, for continuity of care and longitudinal follow up will arrange follow up w/ me at [facility] as well.   No charge.   Tessa Lerner, [place], [facility]  Pager:  [phone] Office: [phone]

## 2022-07-06 NOTE — TOC Initial Note (Signed)
Transition of Care [facility]) - Initial/Assessment Note    Patient Details  Name: Jerry Saunders MRN: [id number] Date of Birth: 1970-07-09  Transition of Care [facility]) CM/SW Contact:    Elliot Cousin, RN Phone Number: [phone] 07/06/2022, 2:14 PM  Clinical Narrative:   HF TOC CM spoke to pt and states he will need a note for work. States he has been at the [employer] company for 5 months and does not qualify for FMLA/short-term disability. Message sent to provider for note for work. Pt reports he has scale at home. Discuss with pt heart health diet and adhering to medications at home. Wife at home to assist with care. Wife will provide transportation home. PCP appt scheduled with Corinda Gubler, Dr Janee Morn for 7/15 at 1:20 pm. Provided pt with appt card for AHF appt and brochure for PCP appt.  Will need meds to come up from [facility] pharmacy. Pt states his pharmacy is closed on Sat.                Expected Discharge Plan: Home/Self Care Barriers to Discharge: No Barriers Identified   Patient Goals and CMS Choice Patient states their goals for this hospitalization and ongoing recovery are:: wants to remain independent          Expected Discharge Plan and Services   Discharge Planning Services: CM Consult   Living arrangements for the past 2 months: Single Family Home                                      Prior Living Arrangements/Services Living arrangements for the past 2 months: Single Family Home Lives with:: Spouse Patient language and need for interpreter reviewed:: Yes Do you feel safe going back to the place where you live?: Yes      Need for Family Participation in Patient Care: No (Comment) Care giver support system in place?: Yes (comment) Current home services: DME (scale) Criminal Activity/Legal Involvement Pertinent to Current Situation/Hospitalization: No - Comment as needed  Activities of Daily Living      Permission Sought/Granted Permission sought to share  information with : Case Manager, Family Supports Permission granted to share information with : Yes, Verbal Permission Granted  Share Information with NAME: Jerry Saunders     Permission granted to share info w Relationship: wife  Permission granted to share info w Contact Information: [phone]  Emotional Assessment Appearance:: Appears stated age Attitude/Demeanor/Rapport: Engaged Affect (typically observed): Accepting Orientation: : Oriented to Self, Oriented to Place, Oriented to  Time, Oriented to Situation   Psych Involvement: No (comment)  Admission diagnosis:  Shock circulatory (HCC) [R57.9] Congestive heart failure, unspecified HF chronicity, unspecified heart failure type (HCC) [I50.9] Acute pulmonary embolism without acute cor pulmonale, unspecified pulmonary embolism type (HCC) [I26.99] Patient Active Problem List   Diagnosis Date Noted   Left ventricular thrombosis 07/02/2022   NSVT (nonsustained ventricular tachycardia) (HCC) 07/02/2022   Atrial flutter, paroxysmal (HCC) 07/02/2022   Cardiogenic shock (HCC) 07/02/2022   Shock circulatory (HCC) 07/01/2022   Acute pulmonary embolism (HCC) 07/01/2022   Acute febrile illness 07/01/2022   Syncope and collapse 07/01/2022   Severe pulmonary hypertension (HCC) 07/01/2022   Elevated troponin I level 07/01/2022   Ventricular ectopy 07/01/2022   History of DVT (deep vein thrombosis) 07/01/2022   Morbid obesity (HCC) 07/01/2022   Noncompliance 07/01/2022   Non-insulin dependent type 2 diabetes mellitus (HCC) 07/01/2022  Biventricular heart failure with reduced left ventricular function (HCC) 07/01/2022   Acute decompensated heart failure (HCC) 07/01/2022   New onset atrial flutter (HCC) 07/01/2022   DVT (deep venous thrombosis) (HCC) 06/20/2021   Acute combined systolic and diastolic congestive heart failure (HCC) 06/20/2021   CHF (congestive heart failure) (HCC) 06/18/2021   Acute hypoxic respiratory failure (HCC)  06/18/2021   Elevated d-dimer 06/18/2021   Type 2 diabetes mellitus (HCC) 06/18/2021   Normocytic anemia 06/18/2021   PCP:  Oneita Hurt No Pharmacy:   [facility] [address]), [place] - [address] [place] ([place]) [place] [address] Phone: [phone] Fax: [phone]     Social Determinants of Health (SDOH) Social History: SDOH Screenings   Food Insecurity: No Food Insecurity (07/02/2022)  Housing: Low Risk  (07/02/2022)  Transportation Needs: No Transportation Needs (07/02/2022)  Utilities: Not At Risk (07/02/2022)  Tobacco Use: Unknown (06/30/2022)   SDOH Interventions:     Readmission Risk Interventions     No data to display

## 2022-07-06 NOTE — Telephone Encounter (Signed)
Pharmacy Patient Advocate Encounter   Received notification that prior authorization for Jardiance 10MG  tablets is required/requested.    PA submitted to [facility] via CoverMyMeds Key/confirmation #/EOC [id number] Status is pending

## 2022-07-07 ENCOUNTER — Encounter (HOSPITAL_COMMUNITY): Payer: Self-pay

## 2022-07-07 ENCOUNTER — Other Ambulatory Visit (HOSPITAL_COMMUNITY): Payer: Self-pay

## 2022-07-07 DIAGNOSIS — R579 Shock, unspecified: Secondary | ICD-10-CM | POA: Diagnosis not present

## 2022-07-07 LAB — CBC
HCT: 36.2 % — ABNORMAL LOW (ref 39.0–52.0)
Hemoglobin: 10.9 g/dL — ABNORMAL LOW (ref 13.0–17.0)
MCH: 26.8 pg (ref 26.0–34.0)
MCHC: 30.1 g/dL (ref 30.0–36.0)
MCV: 88.9 fL (ref 80.0–100.0)
Platelets: 263 10*3/uL (ref 150–400)
RBC: 4.07 MIL/uL — ABNORMAL LOW (ref 4.22–5.81)
RDW: 15.1 % (ref 11.5–15.5)
WBC: 6.6 10*3/uL (ref 4.0–10.5)
nRBC: 0 % (ref 0.0–0.2)

## 2022-07-07 LAB — BASIC METABOLIC PANEL
Anion gap: 7 (ref 5–15)
BUN: 10 mg/dL (ref 6–20)
CO2: 30 mmol/L (ref 22–32)
Calcium: 8.4 mg/dL — ABNORMAL LOW (ref 8.9–10.3)
Chloride: 99 mmol/L (ref 98–111)
Creatinine, Ser: 0.82 mg/dL (ref 0.61–1.24)
GFR, Estimated: >60 mL/min (ref >60)
Glucose, Bld: 109 mg/dL — ABNORMAL HIGH (ref 70–99)
Potassium: 4.3 mmol/L (ref 3.5–5.1)
Sodium: 136 mmol/L (ref 135–145)

## 2022-07-07 LAB — DIGOXIN LEVEL: Digoxin Level: 0.5 ng/mL — ABNORMAL LOW (ref 0.8–2.0)

## 2022-07-07 LAB — GLUCOSE, CAPILLARY
Glucose-Capillary: 109 mg/dL — ABNORMAL HIGH (ref 70–99)
Glucose-Capillary: 111 mg/dL — ABNORMAL HIGH (ref 70–99)
Glucose-Capillary: 121 mg/dL — ABNORMAL HIGH (ref 70–99)

## 2022-07-07 LAB — COOXEMETRY PANEL
Carboxyhemoglobin: 2.1 % — ABNORMAL HIGH (ref 0.5–1.5)
Methemoglobin: 0.9 % (ref 0.0–1.5)
O2 Saturation: 59.3 %
Total hemoglobin: 11.5 g/dL — ABNORMAL LOW (ref 12.0–16.0)

## 2022-07-07 LAB — MAGNESIUM: Magnesium: 2.2 mg/dL (ref 1.7–2.4)

## 2022-07-07 MED ORDER — LOSARTAN POTASSIUM 25 MG PO TABS
12.5000 mg | ORAL_TABLET | Freq: Every day | ORAL | Status: DC
  Administered 2022-07-07: 12.5 mg via ORAL
  Filled 2022-07-07: qty 1

## 2022-07-07 MED ORDER — LOSARTAN POTASSIUM 25 MG PO TABS
12.5000 mg | ORAL_TABLET | Freq: Every day | ORAL | 3 refills | Status: DC
  Filled 2022-07-07 – 2022-08-10 (×2): qty 15, 30d supply, fill #0
  Filled 2022-09-04: qty 15, 30d supply, fill #1
  Filled 2022-10-13 – 2022-10-26 (×2): qty 15, 30d supply, fill #2

## 2022-07-07 NOTE — Progress Notes (Signed)
CARDIAC REHAB PHASE I   PRE:  Rate/Rhythm: 99 NSR  BP:  Sitting: 95/66      SaO2: 98 RA  MODE:  Ambulation: 370 ft   AD:   RW  POST:  Rate/Rhythm: 116 ST  BP:  Sitting: 101/77     SaO2: 98 RA  Pt amb with standby assistance, pt denies CP and SOB during amb and was returned to room w/o complaint.   Pt received HF book and education from RN. I reemphasized the importance of recording daily weights, s/s of fluid retention and when to call provider. Pt received x2 sodium reduction printouts from NCM. Encouraged pt to continue to walk and slowly progress activity when home. Pt received new CRP2 brochures d/t previous brochures getting wet. All questions answered prior to leaving.    Faustino Congress  ACSM-CEP 8:32 AM 07/07/2022    Service time is from 0800 to [phone].

## 2022-07-07 NOTE — Progress Notes (Signed)
Patient ID: Jerry Saunders, male   DOB: 05-23-1970, 52 y.o.   MRN: [id number]     Advanced Heart Failure Rounding Note  PCP-Cardiologist: None   Subjective:   Patient admitted with cardiogenic shock 2/2 noncompliance. CT showed RLL PE. S/p TPA 07/01/22: AFL RVR. Started on IV amio>>NSR.   Co-ox 59% off milrinone. SBP 90s-100s.  Weight trending down. On torsemide.   No complaints this morning, wants to go home .  Objective:   Echo 07/01/22: EF 20% RV severely down Large LV clot   Weight Range: 121.4 kg Body mass index is 34.36 kg/m.   Vital Signs:   Temp:  [98 F (36.7 C)-98.7 F (37.1 C)] 98.7 F (37.1 C) (06/29 0758) Pulse Rate:  [85-208] 103 (06/29 0700) Resp:  [16-41] 16 (06/29 0700) BP: (82-117)/(58-98) 100/75 (06/29 0700) SpO2:  [83 %-100 %] 93 % (06/29 0700) Weight:  [121.4 kg] 121.4 kg (06/29 0600) Last BM Date : 07/04/22  Weight change: Filed Weights   07/05/22 0500 07/06/22 0500 07/07/22 0600  Weight: 124 kg 122.6 kg 121.4 kg   Intake/Output:   Intake/Output Summary (Last 24 hours) at 07/07/2022 0803 Last data filed at 07/07/2022 0600 Gross per 24 hour  Intake --  Output 2650 ml  Net -2650 ml     Physical Exam   General: NAD Neck: No JVD, no thyromegaly or thyroid nodule.  Lungs: Clear to auscultation bilaterally with normal respiratory effort. CV: Lateral PMI.  Heart regular S1/S2, +S3, no murmur.  Trace ankle edema.  Abdomen: Soft, nontender, no hepatosplenomegaly, no distention.  Skin: Intact without lesions or rashes.  Neurologic: Alert and oriented x 3.  Psych: Normal affect. Extremities: No clubbing or cyanosis.  HEENT: Normal.    Telemetry   SR 90s-100s (personally reviewed)   Labs    CBC Recent Labs    07/06/22 0500 07/07/22 0412  WBC 6.8 6.6  HGB 10.8* 10.9*  HCT 35.1* 36.2*  MCV 89.5 88.9  PLT 223 263   Basic Metabolic Panel Recent Labs    14/78/29 0500 07/07/22 0412  NA 135 136  K 3.8 4.3  CL 98 99  CO2 30 30   GLUCOSE 95 109*  BUN 9 10  CREATININE 0.71 0.82  CALCIUM 8.0* 8.4*  MG  --  2.2   Liver Function Tests No results for input(s): "AST", "ALT", "ALKPHOS", "BILITOT", "PROT", "ALBUMIN" in the last 72 hours.  No results for input(s): "LIPASE", "AMYLASE" in the last 72 hours. Cardiac Enzymes No results for input(s): "CKTOTAL", "CKMB", "CKMBINDEX", "TROPONINI" in the last 72 hours.  BNP: BNP (last 3 results) Recent Labs    06/30/22 1810 07/01/22 0337  BNP 2,206.2* 1,884.1*   ProBNP (last 3 results) Recent Labs    01/19/22 1550  PROBNP 540*   D-Dimer No results for input(s): "DDIMER" in the last 72 hours. Hemoglobin A1C No results for input(s): "HGBA1C" in the last 72 hours. Fasting Lipid Panel No results for input(s): "CHOL", "HDL", "LDLCALC", "TRIG", "CHOLHDL", "LDLDIRECT" in the last 72 hours. Thyroid Function Tests No results for input(s): "TSH", "T4TOTAL", "T3FREE", "THYROIDAB" in the last 72 hours.  Invalid input(s): "FREET3"  Other results:  Imaging   No results found.  Medications:     Scheduled Medications:  amiodarone  200 mg Oral BID   apixaban  5 mg Oral BID   Chlorhexidine Gluconate Cloth  6 each Topical Daily   digoxin  0.125 mg Oral Daily   docusate sodium  100 mg Oral BID  empagliflozin  10 mg Oral Daily   insulin aspart  0-15 Units Subcutaneous Q4H   losartan  12.5 mg Oral Daily   pantoprazole  40 mg Oral Daily   polyethylene glycol  17 g Oral Daily   spironolactone  12.5 mg Oral Daily   torsemide  20 mg Oral Daily    Infusions:  sodium chloride Stopped (07/04/22 0200)    PRN Medications: mouth rinse, traMADol  Patient Profile  Jerry Saunders is a 52 y.o. male with systolic HF due to [facility], DM2, obesity, PVCs and noncompliance. Admitted with cardiogenic shock and recurrent PE.  Assessment/Plan  1. Acute on chronic systolic HF due to NICM -> cardiogenic shock - Echo 6/23: EF < 20% RV severely HK - Cath 6/23: No CAD. RA 16 PA 67/43  (54) PCW 27 Fick 5.8/2.3 - cMRI 06/23: EF 20% RV 27% Minimal LGE - Echo 07/01/22: EF 20% RV severely down. Large LV clot. Moderate to severe MR. Moderate to severe TR. - Suspect PE is incidental due to severe RV stasis. Main issues is advanced HF - Off milrinone, co-ox acceptable at 59%.   - Volume status looks stable.  Continue torsemide 20 mg daily.  - Continue Jardiance 10 mg daily - Continue digoxin 0.125mg  daily, level ok.  - Continue spiro 12.5 mg daily - Can add low dose losartan 12.5 daily.  - No beta blocker with low-output HF - Ideally needs transplant but non-compliance major issue. Will need to follow closely as outpatient.   2. Recurrent PE - CT 06/30/22 - Right lower lobe proximal segmental pulmonary embolus. Minimal clot burden with no evidence of right heart strain.  - s/p t-PA at 330 am on 06/30/22 - Suspect PE is incidental due to severe RV stasis. Main issues is advanced HF - Continue Eliquis 5 bid.  - LE venous duplex negative    3. AFL with RVR - SR today. Continue po amiodarone 200 mg BID. Decrease to 200 mg daily at discharge - Continue Eliquis   4. LV thrombus - anticoagulation as above  5. Valvular heart disease - Echo this admit with EF  < 20%, moderate to severe MR and moderate to severe TR - Suspect functional   6. DM2 - A1c 6.7 in 04/24 - SSI - SGLT2i   7. Shock liver -LFTs improved   8. Morbid obesity - Body mass index is 34.36 kg/m.    9. Non-compliance -Discussed taking meds and following up in CHF clinic.  -Meds to [facility] pharmacy at discharge  Can go home today.  Followup CHF clinic.  Meds for home: digoxin 0.125, empagliflozin 10 daily, losartan 12.5 daily, spironolactone 12.5 daily, digoxin 0.125 daily, torsemide 20 daily, Eliquis 5 bid, amiodarone 200 daily  Length of Stay: 6  Marca Ancona, MD  07/07/2022, 8:03 AM  Advanced Heart Failure Team Pager [phone] (M-F; 7a - 5p)  Please contact [facility] for night-coverage after  hours (5p -7a ) and weekends on [email or web address]

## 2022-07-07 NOTE — Progress Notes (Signed)
Pt given copy of discharge AVS.  Pt received TOC pharmacy medications, coupons for Jaridance and eliquis.  Discussed importance of keeping followup appts with providers.  Encouraged pt to notify providers if unable to take medications d/t financial need, etc.

## 2022-07-10 ENCOUNTER — Telehealth (HOSPITAL_COMMUNITY): Payer: Self-pay

## 2022-07-10 NOTE — Telephone Encounter (Signed)
Called patient to see if he was interested in participating in the Cardiac Rehab Program.  Left vm

## 2022-07-18 ENCOUNTER — Ambulatory Visit (HOSPITAL_COMMUNITY)
Admit: 2022-07-18 | Discharge: 2022-07-18 | Disposition: A | Discharge status: Home / Self Care | Payer: 59 | Attending: Cardiology | Admitting: Cardiology

## 2022-07-18 ENCOUNTER — Encounter (HOSPITAL_COMMUNITY): Payer: Self-pay

## 2022-07-18 VITALS — BP 96/60 | HR 107 | Wt 245.4 lb

## 2022-07-18 DIAGNOSIS — Z7901 Long term (current) use of anticoagulants: Secondary | ICD-10-CM | POA: Diagnosis not present

## 2022-07-18 DIAGNOSIS — Z91199 Patient's noncompliance with other medical treatment and regimen due to unspecified reason: Secondary | ICD-10-CM | POA: Insufficient documentation

## 2022-07-18 DIAGNOSIS — I110 Hypertensive heart disease with heart failure: Secondary | ICD-10-CM | POA: Insufficient documentation

## 2022-07-18 DIAGNOSIS — R000 Tachycardia, unspecified: Secondary | ICD-10-CM | POA: Diagnosis not present

## 2022-07-18 DIAGNOSIS — I2699 Other pulmonary embolism without acute cor pulmonale: Secondary | ICD-10-CM | POA: Insufficient documentation

## 2022-07-18 DIAGNOSIS — I081 Rheumatic disorders of both mitral and tricuspid valves: Secondary | ICD-10-CM | POA: Diagnosis not present

## 2022-07-18 DIAGNOSIS — I493 Ventricular premature depolarization: Secondary | ICD-10-CM | POA: Diagnosis not present

## 2022-07-18 DIAGNOSIS — Z7984 Long term (current) use of oral hypoglycemic drugs: Secondary | ICD-10-CM | POA: Insufficient documentation

## 2022-07-18 DIAGNOSIS — I428 Other cardiomyopathies: Secondary | ICD-10-CM | POA: Insufficient documentation

## 2022-07-18 DIAGNOSIS — I4892 Unspecified atrial flutter: Secondary | ICD-10-CM | POA: Diagnosis not present

## 2022-07-18 DIAGNOSIS — I5022 Chronic systolic (congestive) heart failure: Secondary | ICD-10-CM | POA: Diagnosis not present

## 2022-07-18 DIAGNOSIS — E669 Obesity, unspecified: Secondary | ICD-10-CM | POA: Insufficient documentation

## 2022-07-18 DIAGNOSIS — E119 Type 2 diabetes mellitus without complications: Secondary | ICD-10-CM | POA: Insufficient documentation

## 2022-07-18 DIAGNOSIS — Z79899 Other long term (current) drug therapy: Secondary | ICD-10-CM | POA: Insufficient documentation

## 2022-07-18 LAB — CBC
HCT: 47 % (ref 39.0–52.0)
Hemoglobin: 14.4 g/dL (ref 13.0–17.0)
MCH: 26.6 pg (ref 26.0–34.0)
MCHC: 30.6 g/dL (ref 30.0–36.0)
MCV: 86.9 fL (ref 80.0–100.0)
Platelets: 487 10*3/uL — ABNORMAL HIGH (ref 150–400)
RBC: 5.41 MIL/uL (ref 4.22–5.81)
RDW: 15.2 % (ref 11.5–15.5)
WBC: 4.7 10*3/uL (ref 4.0–10.5)
nRBC: 0 % (ref 0.0–0.2)

## 2022-07-18 LAB — BASIC METABOLIC PANEL
Anion gap: 11 (ref 5–15)
BUN: 17 mg/dL (ref 6–20)
CO2: 29 mmol/L (ref 22–32)
Calcium: 9.2 mg/dL (ref 8.9–10.3)
Chloride: 101 mmol/L (ref 98–111)
Creatinine, Ser: 1.13 mg/dL (ref 0.61–1.24)
GFR, Estimated: >60 mL/min (ref >60)
Glucose, Bld: 125 mg/dL — ABNORMAL HIGH (ref 70–99)
Potassium: 3.9 mmol/L (ref 3.5–5.1)
Sodium: 141 mmol/L (ref 135–145)

## 2022-07-18 LAB — DIGOXIN LEVEL: Digoxin Level: 1 ng/mL (ref 0.8–2.0)

## 2022-07-18 LAB — BRAIN NATRIURETIC PEPTIDE: B Natriuretic Peptide: 963.1 pg/mL — ABNORMAL HIGH (ref 0.0–100.0)

## 2022-07-18 NOTE — Patient Instructions (Signed)
There has been no changes to your medications.  Labs done today, your results will be available in MyChart, we will contact you for abnormal readings.  Please follow up with our heart failure pharmacist in 3 weeks  Follow up with the Nurse Practitioner in 6 weeks   Your physician recommends that you schedule a follow-up appointment in: 3 months with an echocardiogram ( October) ** please call the office in [facility] August to arrange your follow up appointment. **  If you have any questions or concerns before your next appointment please send [place] a message through [place] or call our office at [phone].    TO LEAVE A MESSAGE FOR THE NURSE SELECT OPTION 2, PLEASE LEAVE A MESSAGE INCLUDING: YOUR NAME DATE OF BIRTH CALL BACK NUMBER REASON FOR CALL**this is important as we prioritize the call backs  YOU WILL RECEIVE A CALL BACK THE SAME DAY AS LONG AS YOU CALL BEFORE 4:00 PM  At the Advanced Heart Failure Clinic, you and your health needs are our priority. As part of our continuing mission to provide you with exceptional heart care, we have created designated Provider Care Teams. These Care Teams include your primary Cardiologist (physician) and Advanced Practice Providers (APPs- Physician Assistants and Nurse Practitioners) who all work together to provide you with the care you need, when you need it.   You may see any of the following providers on your designated Care Team at your next follow up: Dr Arvilla Meres Dr Marca Ancona Dr. Marcos Eke, NP Robbie Lis, [place] [facility] [place], [place] Brynda Peon, NP Karle Plumber, PharmD   Please be sure to bring in all your medications bottles to every appointment.    Thank you for choosing [facility]

## 2022-07-18 NOTE — Progress Notes (Signed)
Advanced Heart Failure Clinic Note   Referring Physician: PCP: Pcp, No PCP-Cardiologist: Dr. Odis Hollingshead AHF: Dr. Gala Romney    HPI: Jerry Saunders is a 52 y.o. male with systolic HF due to [facility], DM2, obesity, PVCs, PE and noncompliance.    Diagnosed with HF in 6/23. - Echo 6/23: EF < 20% RV severely HK - Cath 6/23 No CAD. RA 16 PA 67/43 (54) PCW 27 Fick 5.8/2.3 - cMRI EF 20% RV 27% Minimal LGE   Admitted 6/24 with severeal weeks of HF symptoms after not taking meds for more than a month. Also reported syncopal episode several days prior to admission.  CT in ER showed right lower lobe proximal segmental pulmonary embolism, minimal clot burden with no evidence of right heart strain. He was tachycardic and visibly SOB requiring Bipap. Received t-PA per CCM.  Shortly after patient developed AFL at 150 bpm. Moved to ICU. Started on IV amio and low-dose NE. Ultimately required addition of milrinone for support. Echo showed biventricular dysfunction, LVEF 20% w/ large LV clot, RV severely down. He converted to NSR on amio gtt. He diuresed w/ IV Lasix and able to wean off milrinone and NE support. GDMT added. Anticoagulated w/ Eliquis. Discharged home on 6/29. D/c wt 267 lb.   He presents today for post hospital f/u. Reports feeling a lot better. Exercise tolerance improved. Able to do ADLs w/o significant dyspnea or limitation. Wt is much lower than d/c wt at 245 lb today ( ? accuracy of charted hospital wt). He has made significant dietary modifications and is eating less fast food and cutting out sodium. BP is also soft, 96/60. Took AM meds already. Denies orthostatic symptoms. EKG shows sinus tachycardia 106 bpm. Reports full med compliance. Denies abnormal bleeding w/ Eliquis. No chest pain.    2D Echo 6/24  1. Left ventricular thrombus 2.7 x 1.6cm (largest dimension). Left  ventricular ejection fraction, by estimation, is <20%. The left ventricle  has severely decreased function. The left ventricle  demonstrates global  hypokinesis. The left ventricular  internal cavity size was severely dilated. Left ventricular diastolic  parameters are consistent with Grade III diastolic dysfunction  (restrictive). There is the interventricular septum is flattened in  systole and diastole, consistent with right  ventricular pressure and volume overload.   2. Degree of pulmonary hypertension is underestimated due to dialted RA  and RV size. Right ventricular systolic function is severely reduced. The  right ventricular size is severely enlarged. There is moderately elevated  pulmonary artery systolic  pressure. The estimated right ventricular systolic pressure is 49.3 mmHg.   3. Left atrial size was severely dilated.   4. Right atrial size was severely dilated.   5. The mitral valve is degenerative. Moderate to severe mitral valve  regurgitation. No evidence of mitral stenosis.   6. Tricuspid valve regurgitation is moderate to severe.   7. The aortic valve is tricuspid. Aortic valve regurgitation is not  visualized. No aortic stenosis is present.   8. The inferior vena cava is dilated in size with <50% respiratory  variability, suggesting right atrial pressure of 15 mmHg.       Review of Systems: [y] = yes, [ ]  = no   General: Weight gain [ ] ; Weight loss [ ] ; Anorexia [ ] ; Fatigue [ ] ; Fever [ ] ; Chills [ ] ; Weakness [ ]   Cardiac: Chest pain/pressure [ ] ; Resting SOB [ ] ; Exertional SOB [ ] ; Orthopnea [ ] ; Pedal Edema [ ] ; Palpitations [ ] ; Syncope [ ] ;  Presyncope [ ] ; Paroxysmal nocturnal dyspnea[ ]   Pulmonary: Cough [ ] ; Wheezing[ ] ; Hemoptysis[ ] ; Sputum [ ] ; Snoring [ ]   GI: Vomiting[ ] ; Dysphagia[ ] ; Melena[ ] ; Hematochezia [ ] ; Heartburn[ ] ; Abdominal pain [ ] ; Constipation [ ] ; Diarrhea [ ] ; BRBPR [ ]   GU: Hematuria[ ] ; Dysuria [ ] ; Nocturia[ ]   Vascular: Pain in legs with walking [ ] ; Pain in feet with lying flat [ ] ; Non-healing sores [ ] ; Stroke [ ] ; TIA [ ] ; Slurred speech [ ] ;   Neuro: Headaches[ ] ; Vertigo[ ] ; Seizures[ ] ; Paresthesias[ ] ;Blurred vision [ ] ; Diplopia [ ] ; Vision changes [ ]   Ortho/Skin: Arthritis [ ] ; Joint pain [ ] ; Muscle pain [ ] ; Joint swelling [ ] ; Back Pain [ ] ; Rash [ ]   Psych: Depression[ ] ; Anxiety[ ]   Heme: Bleeding problems [ ] ; Clotting disorders [ ] ; Anemia [ ]   Endocrine: Diabetes [ ] ; Thyroid dysfunction[ ]    Past Medical History:  Diagnosis Date   Cardiomyopathy (HCC)    CHF (congestive heart failure) (HCC)    Diabetes mellitus without complication (HCC)    DVT (deep venous thrombosis) (HCC)    Hyperlipidemia    Hypertension     Current Outpatient Medications  Medication Sig Dispense Refill   amiodarone (PACERONE) 200 MG tablet Take 1 tablet (200 mg total) by mouth daily. 30 tablet 3   apixaban (ELIQUIS) 5 MG TABS tablet Take 1 tablet (5 mg total) by mouth 2 (two) times daily. 60 tablet 5   digoxin (LANOXIN) 0.125 MG tablet Take 1 tablet (0.125 mg total) by mouth daily. 30 tablet 3   empagliflozin (JARDIANCE) 10 MG TABS tablet Take 1 tablet (10 mg total) by mouth daily. 30 tablet 5   losartan (COZAAR) 25 MG tablet Take 0.5 tablets (12.5 mg total) by mouth daily. 15 tablet 3   spironolactone (ALDACTONE) 25 MG tablet Take 0.5 tablets (12.5 mg total) by mouth daily. 30 tablet 5   torsemide (DEMADEX) 20 MG tablet Take 1 tablet (20 mg total) by mouth daily. 30 tablet 3   No current facility-administered medications for this encounter.    No Known Allergies    Social History   Socioeconomic History   Marital status: Married    Spouse name: [name]   Number of children: 2   Years of education: Not on file   Highest education level: Not on file  Occupational History   Not on file  Tobacco Use   Smoking status: Never   Smokeless tobacco: Not on file  Vaping Use   Vaping Use: Never used  Substance and Sexual Activity   Alcohol use: No   Drug use: Never   Sexual activity: Not on file  Other Topics Concern   Not  on file  Social History Narrative   1 step daughter   Social Determinants of Health   Financial Resource Strain: Not on file  Food Insecurity: No Food Insecurity (07/02/2022)   Hunger Vital Sign    Worried About Running Out of Food in the Last Year: Never true    Ran Out of Food in the Last Year: Never true  Transportation Needs: No Transportation Needs (07/02/2022)   PRAPARE - Administrator, Civil Service (Medical): No    Lack of Transportation (Non-Medical): No  Physical Activity: Not on file  Stress: Not on file  Social Connections: Not on file  Intimate Partner Violence: Not on file     No family history on file.  Vitals:   07/18/22 1042  BP: 96/60  Pulse: (!) 107  SpO2: 98%  Weight: 111.3 kg (245 lb 6.4 oz)     PHYSICAL EXAM: General:  Well appearing. No respiratory difficulty HEENT: normal Neck: supple. no JVD. Carotids 2+ bilat; no bruits. No lymphadenopathy or thyromegaly appreciated. Cor: PMI nondisplaced. Regular rate & rhythm. No rubs, gallops or murmurs. Lungs: clear Abdomen: soft, nontender, nondistended. No hepatosplenomegaly. No bruits or masses. Good bowel sounds. Extremities: no cyanosis, clubbing, rash, edema Neuro: alert & oriented x 3, cranial nerves grossly intact. moves all 4 extremities w/o difficulty. Affect pleasant.  ECG: Sinus tach 106 bpm    ASSESSMENT & PLAN:  1. Chronic Systolic HF due to NICM - Echo 6/23: EF < 20% RV severely HK - Cath 6/23: No CAD. RA 16 PA 67/43 (54) PCW 27 Fick 5.8/2.3 - cMRI 06/23: EF 20% RV 27% Minimal LGE - recent ICU admission for cardiogenic shock 6/24 in setting of med noncompliance>>Echo 07/01/22: EF 20% RV severely down. Large LV clot. Moderate to severe MR. Moderate to severe TR. Required milrinone and NE support. Stabilized, weaned off inotropes/pressors and transitioned to oral GDMT  - today, NYHA Class II. Volume ok on exam. May be a little dry based on wt loss, tachycardia and low BP. Does not  look low output on exam    - check BMP and BNP today.  May need to reduce torsemide from daily to PRN  - Continue Jardiance 10 mg daily - Continue digoxin 0.125mg , Check Dig level  - Continue spiro 12.5 mg daily - Continue losartan 12.5 daily. BP too low for titration/transition to Entresto  - No beta blocker with recent low-output HF - Ideally needs transplant but non-compliance major issue. He has improved compliance since discharge. Encouraged to continue consistent use of meds + f/us   2. Pulmonary Embolism  - CT 06/30/22 - Right lower lobe proximal segmental pulmonary embolus. Minimal clot burden with no evidence of right heart strain.  - LE venous duplex negative  - s/p t-PA on 06/30/22 - Suspect PE is incidental due to severe RV stasis.  - Continue Eliquis 5 bid.   3. Atrial Flutter  - EKG today shows sinus tachy  - Continue po amiodarone 200 mg daily  - Continue Eliquis. Denies abnormal bleeding. Check CBC today    4. LV thrombus - in setting of severe LV dysfunction/AK  - continue anticoagulation as above   5. Valvular heart disease - Echo 6/24 with EF  < 20%, moderate to severe MR and moderate to severe TR - Suspect functional - Treatment of this is HF optimization per above    6. DM2 - Most recent A1c 6.7  - on  SGLT2i - management per PCP     7.  Non-compliance - doing better. Congratulated on efforts  - discussed importance of strict compliance to help w/ management of HF and improve candidacy for advanced therapies if later needed    F/u w/ PharmD  in 2-3 wks for further med titration, APP in 6 wks, Dr. Gala Romney w/ f/u echo in 3 months    Ethelean Colla Sharol Harness, PA-C 07/18/22

## 2022-07-19 ENCOUNTER — Telehealth (HOSPITAL_COMMUNITY): Payer: Self-pay

## 2022-07-19 NOTE — Telephone Encounter (Signed)
-----   Message from Farina sent at 07/18/2022  4:58 PM EDT ----- Renal fx and K stable but fluid marker is still elevated but trending down. Tell patient that he will need to continue taking daily torsemide

## 2022-07-19 NOTE — Telephone Encounter (Signed)
Pt aware, agreeable, and verbalized understanding 

## 2022-07-23 ENCOUNTER — Ambulatory Visit: Payer: 59 | Admitting: Family Medicine

## 2022-07-30 ENCOUNTER — Other Ambulatory Visit (HOSPITAL_COMMUNITY): Payer: Self-pay

## 2022-07-30 ENCOUNTER — Other Ambulatory Visit: Payer: Self-pay | Admitting: Cardiology

## 2022-07-30 DIAGNOSIS — I428 Other cardiomyopathies: Secondary | ICD-10-CM

## 2022-07-30 DIAGNOSIS — I5042 Chronic combined systolic (congestive) and diastolic (congestive) heart failure: Secondary | ICD-10-CM

## 2022-08-06 ENCOUNTER — Ambulatory Visit: Payer: 59 | Admitting: Cardiology

## 2022-08-06 ENCOUNTER — Encounter: Payer: Self-pay | Admitting: Cardiology

## 2022-08-06 VITALS — BP 100/75 | HR 109 | Resp 16 | Ht 74.0 in | Wt 247.0 lb

## 2022-08-06 DIAGNOSIS — I5022 Chronic systolic (congestive) heart failure: Secondary | ICD-10-CM

## 2022-08-06 DIAGNOSIS — I493 Ventricular premature depolarization: Secondary | ICD-10-CM

## 2022-08-06 DIAGNOSIS — I2699 Other pulmonary embolism without acute cor pulmonale: Secondary | ICD-10-CM

## 2022-08-06 DIAGNOSIS — I513 Intracardiac thrombosis, not elsewhere classified: Secondary | ICD-10-CM

## 2022-08-06 DIAGNOSIS — I428 Other cardiomyopathies: Secondary | ICD-10-CM

## 2022-08-06 DIAGNOSIS — E1165 Type 2 diabetes mellitus with hyperglycemia: Secondary | ICD-10-CM

## 2022-08-06 NOTE — Progress Notes (Unsigned)
ID:  Jerry Saunders, DOB 13-Mar-1970, MRN [id number]  PCP:  Oneita Hurt, No  Cardiologist:  Tessa Lerner, DO, FACC (established care 06/20/2021)  Date: 08/06/22 Last Office Visit: 01/18/2022  Chief Complaint  Patient presents with   heart failure management   Follow-up    6 month     HPI  Jerry Saunders is a 52 y.o. African-American male whose past medical history and cardiovascular risk factors include: HFrEF due to ischemic cardiomyopathy, diabetes mellitus type II, PVCs, right lower lobe pulmonary embolism (June 2024 likely due to RV stasis), LV thrombus (June 2024), history of DVT, obesity due to excess calories.   Last seen in the office in January 2024 and since then has been lost to follow-up.  Patient was hospitalized in June 2024 with cardiogenic shock secondary to noncompliance (predominantly due to limited resources).  CT PE protocol showed right lower lobe pulmonary embolism and given the hemodynamic effects and low cardiac reserve he did receive tPA.  During the same hospitalization he went into a flutter with RVR was started on IV amiodarone and transition to sinus rhythm.  Advanced heart failure team was consulted given his young age, biventricular heart failure, acute cardiogenic shock, pulmonary embolism, LV thrombus.  He required inotropic and vasopressor support, shortly weaned off.  Since discharge patient has followed up with advanced heart failure on July 18, 2022.  Since discharge from the hospital patient has been compliant with current medical therapy.  He is making his best effort to keep his follow-up appointments, take pills as prescribed.  He establishes care with Dr. Lorain Childes from family medicine on 08/14/2022.  Overall functional capacity remains relatively stable.  He denies orthopnea, PND, lower extremity swelling.  He continues to work.  When he saw advanced heart failure on 07/18/2022 his weight was 245 pounds and currently it is 247 pounds.  Recent labs from July 18, 2022 independently reviewed.  Renal function is stable.  BNP still elevated but downtrending.  Hemoglobin is stable likely hemoconcentrated.  ALLERGIES: No Known Allergies  MEDICATION LIST PRIOR TO VISIT: Current Meds  Medication Sig   amiodarone (PACERONE) 200 MG tablet Take 1 tablet (200 mg total) by mouth daily.   apixaban (ELIQUIS) 5 MG TABS tablet Take 1 tablet (5 mg total) by mouth 2 (two) times daily.   digoxin (LANOXIN) 0.125 MG tablet Take 1 tablet (0.125 mg total) by mouth daily.   empagliflozin (JARDIANCE) 10 MG TABS tablet Take 1 tablet (10 mg total) by mouth daily.   losartan (COZAAR) 25 MG tablet Take 0.5 tablets (12.5 mg total) by mouth daily.   spironolactone (ALDACTONE) 25 MG tablet Take 0.5 tablets (12.5 mg total) by mouth daily.   torsemide (DEMADEX) 20 MG tablet Take 1 tablet (20 mg total) by mouth daily.     PAST MEDICAL HISTORY: Past Medical History:  Diagnosis Date   Cardiomyopathy (HCC)    CHF (congestive heart failure) (HCC)    Diabetes mellitus without complication (HCC)    DVT (deep venous thrombosis) (HCC)    Hyperlipidemia    Hypertension     PAST SURGICAL HISTORY: Past Surgical History:  Procedure Laterality Date   RIGHT/LEFT HEART CATH AND CORONARY ANGIOGRAPHY N/A 06/20/2021   Procedure: RIGHT/LEFT HEART CATH AND CORONARY ANGIOGRAPHY;  Surgeon: Elder Negus, MD;  Location: [facility] INVASIVE CV LAB;  Service: Cardiovascular;  Laterality: N/A;    FAMILY HISTORY: The patient family history is not on file.  SOCIAL HISTORY:  The patient  reports that he  has never smoked. He does not have any smokeless tobacco history on file. He reports that he does not drink alcohol and does not use drugs.  REVIEW OF SYSTEMS: Review of Systems  Cardiovascular:  Positive for dyspnea on exertion. Negative for chest pain, leg swelling, near-syncope, orthopnea, palpitations, paroxysmal nocturnal dyspnea and syncope.  Respiratory:  Negative for shortness of breath.    Hematologic/Lymphatic: Negative for bleeding problem.  Neurological:  Negative for dizziness and light-headedness.    PHYSICAL EXAM:    08/06/2022   10:37 AM 07/18/2022   10:42 AM 07/07/2022    8:00 AM  Vitals with BMI  Height 6\' 2"     Weight 247 lbs 245 lbs 6 oz   BMI 31.7    Systolic 100 96 95  Diastolic 75 60 66  Pulse 109 107 102    Physical Exam  Constitutional: No distress.  Age appropriate, hemodynamically stable.   Neck: No JVD present.  Cardiovascular: Regular rhythm, S1 normal, S2 normal, intact distal pulses and normal pulses. Tachycardia present. Exam reveals no gallop, no S3 and no S4.  No murmur heard. Pulmonary/Chest: Effort normal and breath sounds normal. No stridor. He has no wheezes. He has no rales.  Abdominal: Soft. Bowel sounds are normal. He exhibits no distension. There is no abdominal tenderness.  Musculoskeletal:        General: No edema.     Cervical back: Neck supple.  Neurological: He is alert and oriented to person, place, and time. He has intact cranial nerves (2-12).  Skin: Skin is warm and moist.   CARDIAC DATABASE: EKG: August 06, 2022: Sinus rhythm, 84 bpm, LAE, left axis, without underlying ischemia or injury pattern.  Echocardiogram: 06/19/2021: LVEF <20%, severely reduced systolic function, global hypokinesis, left ventricular size is dilated, right ventricular size is dilated, systolic function reduced, biatrial enlargement, mild MR, IVC dilated, estimated RAP 15 mmHg.  07/01/2022  1. Left ventricular thrombus 2.7 x 1.6cm (largest dimension). Left  ventricular ejection fraction, by estimation, is <20%. The left ventricle  has severely decreased function. The left ventricle demonstrates global  hypokinesis. The left ventricular  internal cavity size was severely dilated. Left ventricular diastolic  parameters are consistent with Grade III diastolic dysfunction  (restrictive). There is the interventricular septum is flattened in   systole and diastole, consistent with right  ventricular pressure and volume overload.   2. Degree of pulmonary hypertension is underestimated due to dialted RA  and RV size. Right ventricular systolic function is severely reduced. The  right ventricular size is severely enlarged. There is moderately elevated  pulmonary artery systolic  pressure. The estimated right ventricular systolic pressure is 49.3 mmHg.   3. Left atrial size was severely dilated.   4. Right atrial size was severely dilated.   5. The mitral valve is degenerative. Moderate to severe mitral valve  regurgitation. No evidence of mitral stenosis.   6. Tricuspid valve regurgitation is moderate to severe.   7. The aortic valve is tricuspid. Aortic valve regurgitation is not  visualized. No aortic stenosis is present.   8. The inferior vena cava is dilated in size with <50% respiratory  variability, suggesting right atrial pressure of 15 mmHg.   Comparison(s): Prior echo 06/19/2021: LVEF <20%, global HK, LV dilated, RV  dilated and reduced function, RAP .    Heart Catheterization: Right dominant circulation, normal coronaries No angiographic evidence of coronary artery disease   RA: 16 mmHg RV: 69/11 mmHg, RVEDP 24 mmHg PA: 67/43 mmHg, mPAP  54 mmHg PCW: 27 mmHg   CO: 5.8 L/min CI: 2.3 L/min/m2   Patient went into SVT 3 times with any minimal irritation of LV during left heart catheterization, requiring vagal maneuvers, adenosine 6 mg, 12 mg, with conversion to sinus rhythm   Conclusion: Decompensated nonischemic cardiomyopathy Consider dilated or arrhythmia induced cardiomyopathy  Severe pulmonary hypertension, WHO grp II   GDMT for HFrEF with continued workup for etiology evaluation  Cardiac MRI 06/22/2021 1.  Severe LV dilatation with severe systolic dysfunction (EF 20%)  2.  Moderate RV dilatation with severe systolic dysfunction (EF 27%)  3. RV insertion site late gadolinium enhancement, which is  a nonspecific finding often seen in setting of elevated pulmonary pressures. No other LGE seen.   Bilateral lower extremity venous duplex: 06/19/2021 RIGHT:  - There is no evidence of deep vein thrombosis in the lower extremity.   - No cystic structure found in the popliteal fossa.     LEFT:  - Findings consistent with age indeterminate deep vein thrombosis involving the left gastrocnemius veins.  - No cystic structure found in the popliteal fossa.   LABORATORY DATA:    Latest Ref Rng & Units 07/18/2022   11:15 AM 07/07/2022    4:12 AM 07/06/2022    5:00 AM  CBC  WBC 4.0 - 10.5 K/uL 4.7  6.6  6.8   Hemoglobin 13.0 - 17.0 g/dL 38.7  56.4  33.2   Hematocrit 39.0 - 52.0 % 47.0  36.2  35.1   Platelets 150 - 400 K/uL 487  263  223        Latest Ref Rng & Units 07/18/2022   11:15 AM 07/07/2022    4:12 AM 07/06/2022    5:00 AM  CMP  Glucose 70 - 99 mg/dL 951  884  95   BUN 6 - 20 mg/dL 17  10  9    Creatinine 0.61 - 1.24 mg/dL 1.66  0.63  0.16   Sodium 135 - 145 mmol/L 141  136  135   Potassium 3.5 - 5.1 mmol/L 3.9  4.3  3.8   Chloride 98 - 111 mmol/L 101  99  98   CO2 22 - 32 mmol/L 29  30  30    Calcium 8.9 - 10.3 mg/dL 9.2  8.4  8.0     Lipid Panel     Component Value Date/Time   CHOL 116 01/19/2022 1554   TRIG 64 01/19/2022 1554   HDL 44 01/19/2022 1554   CHOLHDL 3.5 06/21/2021 0209   VLDL 8 06/21/2021 0209   LDLCALC 58 01/19/2022 1554   LDLDIRECT 60 01/19/2022 1554   LDLDIRECT 51.4 06/21/2021 0209   LABVLDL 14 01/19/2022 1554    No components found for: "NTPROBNP" Recent Labs    01/19/22 1550  PROBNP 540*   No results for input(s): "TSH" in the last 8760 hours.   BMP Recent Labs    07/06/22 0500 07/07/22 0412 07/18/22 1115  NA 135 136 141  K 3.8 4.3 3.9  CL 98 99 101  CO2 30 30 29   GLUCOSE 95 109* 125*  BUN 9 10 17   CREATININE 0.71 0.82 1.13  CALCIUM 8.0* 8.4* 9.2  GFRNONAA >60 >60 >60    HEMOGLOBIN A1C Lab Results  Component Value Date    HGBA1C 7.0 (H) 01/19/2022   MPG 177.16 06/19/2021    IMPRESSION:    ICD-10-CM   1. Chronic HFrEF (heart failure with reduced ejection fraction) (HCC)  I50.22 EKG 12-Lead  2. Nonischemic cardiomyopathy (HCC)  I42.8     3. Type 2 diabetes mellitus with hyperglycemia, without long-term current use of insulin (HCC)  E11.65     4. Acute pulmonary embolism, unspecified pulmonary embolism type, unspecified whether acute cor pulmonale present (HCC)  I26.99     5. LV (left ventricular) mural thrombus  I51.3     6. Premature ventricular beat  I49.3        RECOMMENDATIONS: Dashun Kostelnik is a 52 y.o. African-American male whose past medical history and cardiac risk factors include:  HFrEF due to ischemic cardiomyopathy, diabetes mellitus type II, PVCs, right lower lobe pulmonary embolism (June 2024 likely due to RV stasis), LV thrombus (June 2024), history of DVT, obesity due to excess calories.   Chronic HFrEF (heart failure with reduced ejection fraction) (HCC) Nonischemic cardiomyopathy (HCC) Recent hospitalization in June 2024 for acute decompensated heart failure/cardiogenic shock. Has undergone appropriate cardiovascular workup in the past including echocardiography, heart catheterization, cardiac MRI.  Results reviewed as part of medical decision making. Stage C, NYHA class II. Weight remains stable status post discharge. Patient is making a strong effort with regards to medication and outpatient follow-up compliance. Medications reconciled. No changes warranted at this time. Patient states that at times he is having difficulty with resources -given his young age and the need for possible advanced heart failure therapies have asked him to follow-up with advanced heart failure as a priority over general cardiology.  Patient verbalized understanding and is thankful for guidance  Acute pulmonary embolism, unspecified pulmonary embolism type, unspecified whether acute cor pulmonale  present (HCC) Likely secondary to orthostasis. Status post tPA 06/30/2022. Lower extremity venous duplex during recent hospitalization negative. In 2023 he had a DVT study which noted indeterminate deep vein thrombosis involving the left gastrocnemius veins.  Currently on Eliquis. Does not endorse evidence of bleeding  LV (left ventricular) mural thrombus In the setting of severe biventricular heart failure. Continue current oral anticoagulation. Patient is scheduled for repeat echocardiogram prior to his office visit with advanced heart failure  FINAL MEDICATION LIST END OF ENCOUNTER: No orders of the defined types were placed in this encounter.   There are no discontinued medications.    Current Outpatient Medications:    amiodarone (PACERONE) 200 MG tablet, Take 1 tablet (200 mg total) by mouth daily., Disp: 30 tablet, Rfl: 3   apixaban (ELIQUIS) 5 MG TABS tablet, Take 1 tablet (5 mg total) by mouth 2 (two) times daily., Disp: 60 tablet, Rfl: 5   digoxin (LANOXIN) 0.125 MG tablet, Take 1 tablet (0.125 mg total) by mouth daily., Disp: 30 tablet, Rfl: 3   empagliflozin (JARDIANCE) 10 MG TABS tablet, Take 1 tablet (10 mg total) by mouth daily., Disp: 30 tablet, Rfl: 5   losartan (COZAAR) 25 MG tablet, Take 0.5 tablets (12.5 mg total) by mouth daily., Disp: 15 tablet, Rfl: 3   spironolactone (ALDACTONE) 25 MG tablet, Take 0.5 tablets (12.5 mg total) by mouth daily., Disp: 30 tablet, Rfl: 5   torsemide (DEMADEX) 20 MG tablet, Take 1 tablet (20 mg total) by mouth daily., Disp: 30 tablet, Rfl: 3  Orders Placed This Encounter  Procedures   EKG 12-Lead    There are no Patient Instructions on file for this visit.   --Continue cardiac medications as reconciled in final medication list. --Return in about 3 months (around 11/06/2022) for Follow up, heart failure management.. or sooner if needed. --Continue follow-up with your primary care physician regarding the management of your other  chronic comorbid conditions.  Patient's questions and concerns were addressed to his satisfaction. He voices understanding of the instructions provided during this encounter.   This note was created using a voice recognition software as a result there may be grammatical errors inadvertently enclosed that do not reflect the nature of this encounter. Every attempt is made to correct such errors.  Tessa Lerner, [place], [place] [facility]  Pager: [phone] Office: [phone]

## 2022-08-09 ENCOUNTER — Other Ambulatory Visit: Payer: Self-pay | Admitting: Cardiology

## 2022-08-09 DIAGNOSIS — I428 Other cardiomyopathies: Secondary | ICD-10-CM

## 2022-08-09 DIAGNOSIS — I5042 Chronic combined systolic (congestive) and diastolic (congestive) heart failure: Secondary | ICD-10-CM

## 2022-08-10 ENCOUNTER — Other Ambulatory Visit (HOSPITAL_COMMUNITY): Payer: Self-pay

## 2022-08-10 MED ORDER — APIXABAN 5 MG PO TABS
5.0000 mg | ORAL_TABLET | Freq: Two times a day (BID) | ORAL | 4 refills | Status: DC
Start: 2022-07-06 — End: 2023-01-07
  Filled 2022-08-10: qty 60, 30d supply, fill #0
  Filled 2022-09-04: qty 60, 30d supply, fill #1
  Filled 2022-10-13 – 2022-11-02 (×4): qty 60, 30d supply, fill #2

## 2022-08-13 ENCOUNTER — Other Ambulatory Visit (HOSPITAL_COMMUNITY): Payer: Self-pay

## 2022-08-14 ENCOUNTER — Encounter: Payer: Self-pay | Admitting: Family Medicine

## 2022-08-14 ENCOUNTER — Ambulatory Visit (INDEPENDENT_AMBULATORY_CARE_PROVIDER_SITE_OTHER): Payer: 59 | Admitting: Family Medicine

## 2022-08-14 VITALS — BP 98/72 | HR 65 | Temp 97.9°F | Ht 72.75 in | Wt 253.0 lb

## 2022-08-14 DIAGNOSIS — Z7984 Long term (current) use of oral hypoglycemic drugs: Secondary | ICD-10-CM | POA: Diagnosis not present

## 2022-08-14 DIAGNOSIS — E1165 Type 2 diabetes mellitus with hyperglycemia: Secondary | ICD-10-CM

## 2022-08-14 DIAGNOSIS — I5020 Unspecified systolic (congestive) heart failure: Secondary | ICD-10-CM

## 2022-08-14 LAB — POCT GLYCOSYLATED HEMOGLOBIN (HGB A1C): Hemoglobin A1C: 6.3 % — AB (ref 4.0–5.6)

## 2022-08-14 NOTE — Progress Notes (Signed)
Assessment/Plan:   Problem List Items Addressed This Visit       Cardiovascular and Mediastinum   CHF (congestive heart failure) (HCC)    Jerry Saunders has a history of heart failure and a recent blood clot managed with medication and monitored by the heart failure clinic.  Plan: Continue current medications as prescribed  Counseled on compliance Ensure timely refills to prevent missed doses on future vacations. Follow up with the heart failure clinic as scheduled.        Endocrine   Type 2 diabetes mellitus (HCC) - Primary    Well-controlled on Jardiance 10 mg  Plan: Continue Jardiance 10 mg Check fasting blood sugars weekly Follow-up in 3 months      Relevant Orders   POCT glycosylated hemoglobin (Hb A1C) (Completed)    There are no discontinued medications.  Return in about 3 months (around 11/14/2022) for DM.    Subjective:   Encounter date: 08/14/2022  Jerry Saunders is a 52 y.o. 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   He  has a past medical history of Cardiomyopathy (HCC), CHF (congestive heart failure) (HCC), Diabetes mellitus  without complication (HCC), DVT (deep venous thrombosis) (HCC), Hyperlipidemia, and Hypertension.Marland Kitchen   He presents with chief complaint of Establish Care .   Chief Complaint: Establishing care, follow-up for cardiac history, and diabetes management.  History of Present Illness: Cardiac History: Jerry Saunders presents today to establish care and follow up on his significant cardiac history. He has a history of systolic heart failure (HF) due to non-ischemic cardiomyopathy (NICM), type 2 diabetes mellitus (DM II), obesity, premature ventricular contractions (PVCs), and medication noncompliance. He was admitted on June 30, 2022, with cardiogenic shock and pulmonary embolism (PE). During that hospitalization, an echocardiogram revealed an ejection fraction (EF) of 20%, a severely hypokinetic right ventricle (RV), and a large left ventricular (LV) thrombus. Managed with inotropic and diuretic support, conversion to sinus rhythm with IV amiodarone, and anticoagulation for PE and LV thrombus, he was discharged on July 07, 2022, with prescriptions including amiodarone, digoxin, Eliquis, and Jardiance.  He was seen by Dr. Gala Romney with the heart failure clinic for follow-up on July 18, 2022, with noted improved exercise tolerance, weight reduction, and stable vitals but persistent sinus tachycardia.  Today he reports that currently, he has no chest pain or shortness of breath.  Diabetes: Jerry Saunders's diabetes appears well-controlled. His last hemoglobin A1c was approximately 7 in January, improved to 6.3 recently. Currently on Jardiance 10 mg daily, he reports no adverse side effects and takes his medications as prescribed.  Review of Systems  Constitutional:  Negative for chills, diaphoresis, fever, malaise/fatigue and weight loss.  HENT:  Negative for congestion, ear discharge, ear pain and hearing loss.   Eyes:  Negative for blurred vision, double vision, photophobia, pain, discharge and redness.  Respiratory:   Negative for cough, sputum production,  shortness of breath and wheezing.   Cardiovascular:  Negative for chest pain and palpitations.  Gastrointestinal:  Negative for abdominal pain, blood in stool, constipation, diarrhea, heartburn, melena, nausea and vomiting.  Genitourinary:  Negative for dysuria, flank pain, frequency, hematuria and urgency.  Musculoskeletal:  Negative for myalgias.  Skin:  Negative for itching and rash.  Neurological:  Negative for dizziness, tingling, tremors, speech change, seizures, loss of consciousness, weakness and headaches.  Endo/Heme/Allergies:  Negative for polydipsia.  Psychiatric/Behavioral:  Negative for depression, hallucinations, memory loss, substance abuse and suicidal ideas. The patient does not have insomnia.   All other systems reviewed and are negative.   Past Surgical History:  Procedure Laterality Date   RIGHT/LEFT HEART CATH AND CORONARY ANGIOGRAPHY N/A 06/20/2021   Procedure: RIGHT/LEFT HEART CATH AND CORONARY ANGIOGRAPHY;  Surgeon: Elder Negus, MD;  Location: [facility];  Service: Cardiovascular;  Laterality: N/A;    Outpatient Medications Prior to Visit  Medication Sig Dispense Refill   amiodarone (PACERONE) 200 MG tablet Take 1 tablet (200 mg total) by mouth daily. 30 tablet 3   apixaban (ELIQUIS) 5 MG TABS tablet Take 1 tablet (5 mg total) by mouth 2 (two) times daily. 60 tablet 4   digoxin (LANOXIN) 0.125 MG tablet Take 1 tablet (0.125 mg total) by mouth daily. 30 tablet 3   empagliflozin (JARDIANCE) 10 MG TABS tablet Take 1 tablet (10 mg total) by mouth daily. 30 tablet 5   losartan (COZAAR) 25 MG tablet Take 0.5 tablets (12.5 mg total) by mouth daily. 15 tablet 3   spironolactone (ALDACTONE) 25 MG tablet Take 0.5 tablets (12.5 mg total) by mouth daily. 30 tablet 5   torsemide (DEMADEX) 20 MG tablet Take 1 tablet (20 mg total) by mouth daily. 30 tablet 3   No facility-administered medications prior to visit.    No  family history on file.  Social History   Socioeconomic History   Marital status: Married    Spouse name: Jerry Saunders   Number of children: 2   Years of education: Not on file   Highest education level: Not on file  Occupational History   Not on file  Tobacco Use   Smoking status: Never   Smokeless tobacco: Not on file  Vaping Use   Vaping status: Never Used  Substance and Sexual Activity   Alcohol use: No   Drug use: Never   Sexual activity: Not on file  Other Topics Concern   Not on file  Social History Narrative   1 step daughter   Social Determinants of Health   Financial Resource Strain: Not on file  Food Insecurity: No Food Insecurity (07/02/2022)   Hunger Vital Sign    Worried About Running Out of Food in the Last Year: Never true    Ran Out of Food in the Last Year: Never true  Transportation Needs: No Transportation Needs (07/02/2022)   PRAPARE - Administrator, Civil Service (Medical): No    Lack of Transportation (Non-Medical): No  Physical Activity: Not on file  Stress: Not on file  Social Connections: Not on file  Intimate Partner Violence: Not on file  Objective:  Physical Exam: BP 98/72 (BP Location: Left Arm, Patient Position: Sitting, Cuff Size: Large)   Pulse 65   Temp 97.9 F (36.6 C) (Temporal)   Ht 6' 0.75" (1.848 m)   Wt 253 lb (114.8 kg)   SpO2 99%   BMI 33.61 kg/m     Physical Exam Constitutional:      Appearance: Normal appearance.  HENT:     Head: Normocephalic and atraumatic.     Right Ear: Hearing normal.     Left Ear: Hearing normal.     Nose: Nose normal.  Eyes:     General: No scleral icterus.       Right eye: No discharge.        Left eye: No discharge.     Extraocular Movements: Extraocular movements intact.  Cardiovascular:     Rate and Rhythm: Normal rate and regular rhythm.     Heart sounds: Normal heart  sounds.  Pulmonary:     Effort: Pulmonary effort is normal.     Breath sounds: Normal breath sounds.  Abdominal:     Palpations: Abdomen is soft.     Tenderness: There is no abdominal tenderness.  Skin:    General: Skin is warm.     Findings: No rash.  Neurological:     General: No focal deficit present.     Mental Status: He is alert.     Cranial Nerves: No cranial nerve deficit.  Psychiatric:        Mood and Affect: Mood normal.        Behavior: Behavior normal.        Thought Content: Thought content normal.        Judgment: Judgment normal.     VAS [place] LOWER EXTREMITY VENOUS (DVT)  Result Date: 07/02/2022  Lower Venous DVT Study Patient Name:  DARRYEL BOISSEAU  Date of Exam:   07/02/2022 Medical Rec #: [id number]      Accession #:    [id number] Date of Birth: [birth year]      Patient Gender: M Patient Age:   21 years Exam Location:  [facility] Procedure:      VAS [place] LOWER EXTREMITY VENOUS (DVT) Referring Phys: GRACE BOWSER --------------------------------------------------------------------------------  Indications: Swelling.  Comparison Study: Previous 06/19/21 age indeterminate in left gastroc Performing Technologist: McKayla Maag RVT, [place]  Examination Guidelines: A complete evaluation includes B-mode imaging, spectral Doppler, color Doppler, and power Doppler as needed of all accessible portions of each vessel. Bilateral testing is considered an integral part of a complete examination. Limited examinations for reoccurring indications may be performed as noted. The reflux portion of the exam is performed with the patient in reverse Trendelenburg.  +--------+---------------+---------+-----------+----------+--------------------+ RIGHT   CompressibilityPhasicitySpontaneityPropertiesThrombus Aging       +--------+---------------+---------+-----------+----------+--------------------+ CFV     Full           Yes      Yes                                        +--------+---------------+---------+-----------+----------+--------------------+ SFJ     Full                                                              +--------+---------------+---------+-----------+----------+--------------------+  FV Prox Full                                                              +--------+---------------+---------+-----------+----------+--------------------+ FV Mid  Full                                                              +--------+---------------+---------+-----------+----------+--------------------+ FV                     Yes      Yes                  Patent by color      Distal                                               doppler              +--------+---------------+---------+-----------+----------+--------------------+ PFV     Full                                                              +--------+---------------+---------+-----------+----------+--------------------+ POP     Full           Yes      Yes                                       +--------+---------------+---------+-----------+----------+--------------------+ PTV     Full                                                              +--------+---------------+---------+-----------+----------+--------------------+ PERO    Full                                                              +--------+---------------+---------+-----------+----------+--------------------+   +--------+---------------+---------+-----------+----------+--------------------+ LEFT    CompressibilityPhasicitySpontaneityPropertiesThrombus Aging       +--------+---------------+---------+-----------+----------+--------------------+ CFV     Full           Yes      Yes                                       +--------+---------------+---------+-----------+----------+--------------------+ SFJ     Full                                                               +--------+---------------+---------+-----------+----------+--------------------+  FV Prox Full                                                              +--------+---------------+---------+-----------+----------+--------------------+ FV Mid  Full                                                              +--------+---------------+---------+-----------+----------+--------------------+ FV                     Yes      Yes                  Patent by color      Distal                                               doppler              +--------+---------------+---------+-----------+----------+--------------------+ PFV     Full                                                              +--------+---------------+---------+-----------+----------+--------------------+ POP     Full           Yes      Yes                                       +--------+---------------+---------+-----------+----------+--------------------+ PTV     Full                                                              +--------+---------------+---------+-----------+----------+--------------------+ PERO    Full                                                              +--------+---------------+---------+-----------+----------+--------------------+     Summary: RIGHT: - There is no evidence of deep vein thrombosis in the lower extremity. However, portions of this examination were limited- see technologist comments above.  - No cystic structure found in the popliteal fossa.  LEFT: - There is no evidence of deep vein thrombosis in the lower extremity. However, portions of this examination were limited- see technologist comments above.  - No cystic structure found in the popliteal fossa.  *See table(s) above for measurements and observations. Electronically signed by Gerarda Fraction  on 07/02/2022 at 6:00:05 PM.    Final    DG CHEST PORT 1 VIEW  Result Date: 07/01/2022 CLINICAL DATA:   Catheter placement. EXAM: PORTABLE CHEST 1 VIEW COMPARISON:  June 30, 2022. FINDINGS: Stable cardiomegaly. Interval placement of left subclavian catheter with distal tip in expected position of the SVC. No pneumothorax is noted. Central pulmonary vascular congestion is again noted. Possible bilateral pulmonary edema is noted. Bony thorax is unremarkable. IMPRESSION: Interval placement of left subclavian catheter with distal tip in expected position of the SVC. No pneumothorax. Electronically Signed   By: Lupita Raider M.D.   On: 07/01/2022 16:05   ECHOCARDIOGRAM COMPLETE  Result Date: 07/01/2022    ECHOCARDIOGRAM REPORT   Patient Name:   CABLE WATLAND Date of Exam: 07/01/2022 Medical Rec #:  [id number]     Height:       74.0 in Accession #:    [id number]    Weight:       275.1 lb Date of Birth:  [birth year]     BSA:          2.489 m Patient Age:    51 years      BP:           100/79 mmHg Patient Gender: M             HR:           100 bpm. Exam Location:  Inpatient Procedure: 2D Echo, Color Doppler, Cardiac Doppler and Intracardiac            Opacification Agent Indications:     I50.9* Heart failure (unspecified); I26.02 Pulmonary embolus  History:         Patient has prior history of Echocardiogram examinations, most                  recent 06/19/2021. CHF, Pulmonary HTN, Arrythmias:Atrial                  Fibrillation; Risk Factors:Hypertension, Diabetes and                  Dyslipidemia.  Sonographer:     Irving Burton Senior RDCS Referring Phys:  [id number] Tessa Lerner Diagnosing Phys: Tessa Lerner DO IMPRESSIONS  1. Left ventricular thrombus 2.7 x 1.6cm (largest dimension). Left ventricular ejection fraction, by estimation, is <20%. The left ventricle has severely decreased function. The left ventricle demonstrates global hypokinesis. The left ventricular internal cavity size was severely dilated. Left ventricular diastolic parameters are consistent with Grade III diastolic dysfunction (restrictive). There is the  interventricular septum is flattened in systole and diastole, consistent with right ventricular pressure and volume overload.  2. Degree of pulmonary hypertension is underestimated due to dialted RA and RV size. Right ventricular systolic function is severely reduced. The right ventricular size is severely enlarged. There is moderately elevated pulmonary artery systolic pressure. The estimated right ventricular systolic pressure is 49.3 mmHg.  3. Left atrial size was severely dilated.  4. Right atrial size was severely dilated.  5. The mitral valve is degenerative. Moderate to severe mitral valve regurgitation. No evidence of mitral stenosis.  6. Tricuspid valve regurgitation is moderate to severe.  7. The aortic valve is tricuspid. Aortic valve regurgitation is not visualized. No aortic stenosis is present.  8. The inferior vena cava is dilated in size with <50% respiratory variability, suggesting right atrial pressure of 15 mmHg. Comparison(s): Prior echo 06/19/2021: LVEF <20%, global HK, LV dilated, RV dilated and reduced function, RAP  . Conclusion(s)/Recommendation(s): Findings consistent with Dilated biventricular heart failure. Left ventricular thrombus present as noted above. FINDINGS  Left Ventricle: Left ventricular thrombus 2.7 x 1.6cm (largest dimension). Left ventricular ejection fraction, by estimation, is <20%. The left ventricle has severely decreased function. The left ventricle demonstrates global hypokinesis. Definity contrast agent was given IV to delineate the left ventricular endocardial borders. The left ventricular internal cavity size was severely dilated. There is no left ventricular hypertrophy. The interventricular septum is flattened in systole and diastole,  consistent with right ventricular pressure and volume overload. Left ventricular diastolic parameters are consistent with Grade III diastolic dysfunction (restrictive). Right Ventricle: Degree of pulmonary hypertension is  underestimated due to dialted RA and RV size. The right ventricular size is severely enlarged. Right vetricular wall thickness was not well visualized. Right ventricular systolic function is severely reduced. There is moderately elevated pulmonary artery systolic pressure. The tricuspid regurgitant velocity is 2.93 m/s, and with an assumed right atrial pressure of 15 mmHg, the estimated right ventricular systolic pressure is 49.3 mmHg. Left Atrium: Left atrial size was severely dilated. Right Atrium: Right atrial size was severely dilated. Pericardium: There is no evidence of pericardial effusion. Mitral Valve: The mitral valve is degenerative in appearance. There is mild thickening of the mitral valve leaflet(s). Moderate to severe mitral valve regurgitation, with centrally-directed jet. No evidence of mitral valve stenosis. Tricuspid Valve: The tricuspid valve is grossly normal. Tricuspid valve regurgitation is moderate to severe. No evidence of tricuspid stenosis. Aortic Valve: The aortic valve is tricuspid. Aortic valve regurgitation is not visualized. No aortic stenosis is present. Pulmonic Valve: The pulmonic valve was grossly normal. Pulmonic valve regurgitation is mild. No evidence of pulmonic stenosis. Aorta: The aortic root and ascending aorta are structurally normal, with no evidence of dilitation. Venous: The inferior vena cava is dilated in size with less than 50% respiratory variability, suggesting right atrial pressure of 15 mmHg. IAS/Shunts: The interatrial septum was not well visualized.  LEFT VENTRICLE PLAX 2D LVIDd:         6.90 cm      Diastology LVIDs:         6.70 cm      LV e' medial:    4.68 cm/s LV PW:         0.90 cm      LV E/e' medial:  17.3 LV IVS:        0.80 cm      LV e' lateral:   9.57 cm/s LVOT diam:     1.80 cm      LV E/e' lateral: 8.5 LV SV:         26 LV SV Index:   10 LVOT Area:     2.54 cm  LV Volumes (MOD) LV vol d, MOD A2C: 232.0 ml LV vol d, MOD A4C: 281.0 ml LV vol s, MOD  A2C: 218.0 ml LV vol s, MOD A4C: 212.0 ml LV SV MOD A2C:     14.0 ml LV SV MOD A4C:     281.0 ml LV SV MOD BP:      49.6 ml RIGHT VENTRICLE RV S prime:     12.10 cm/s TAPSE (M-mode): 1.3 cm LEFT ATRIUM              Index        RIGHT ATRIUM           Index LA diam:        5.20 cm  2.09 cm/m  RA Area:     35.00 cm LA Vol (A2C):   169.0 ml 67.90 ml/m  RA Volume:   135.00 ml 54.24 ml/m LA Vol (A4C):   133.0 ml 53.44 ml/m LA Biplane Vol: 166.0 ml 66.69 ml/m  AORTIC VALVE LVOT Vmax:   73.40 cm/s LVOT Vmean:  56.200 cm/s LVOT VTI:    0.101 m  AORTA Ao Root diam: 3.10 cm Ao Asc diam:  3.10 cm MITRAL VALVE                  TRICUSPID VALVE MV Area (PHT): 3.60 cm       TR Peak grad:   34.3 mmHg MV Decel Time: 211 msec       TR Vmax:        293.00 cm/s MR Peak grad:    50.1 mmHg MR Mean grad:    35.0 mmHg    SHUNTS MR Vmax:         354.00 cm/s  Systemic VTI:  0.10 m MR Vmean:        278.0 cm/s   Systemic Diam: 1.80 cm MR PISA:         2.26 cm MR PISA Eff ROA: 25 mm MR PISA Radius:  0.60 cm MV E velocity: 80.90 cm/s Sunit Tolia DO Electronically signed by Tessa Lerner DO Signature Date/Time: 07/01/2022/11:12:36 AM    Final    CT Angio Chest PE W and/or Wo Contrast  Result Date: 06/30/2022 CLINICAL DATA:  Cough, short of breath, febrile, suspected pulmonary embolus EXAM: CT ANGIOGRAPHY CHEST WITH CONTRAST TECHNIQUE: Multidetector CT imaging of the chest was performed using the standard protocol during bolus administration of intravenous contrast. Multiplanar CT image reconstructions and MIPs were obtained to evaluate the vascular anatomy. RADIATION DOSE REDUCTION: This exam was performed according to the departmental dose-optimization program which includes automated exposure control, adjustment of the mA and/or kV according to patient size and/or use of iterative reconstruction technique. CONTRAST:  75mL OMNIPAQUE IOHEXOL 350 MG/ML SOLN COMPARISON:  06/30/2022, 06/18/2021 FINDINGS: Cardiovascular: This is a  technically adequate evaluation of the pulmonary vasculature. There is a proximal segmental pulmonary embolus within the right lower lobe. Minimal clot burden. No evidence of right heart strain, with chronic dilatation of the cardiac chambers again noted. Continued cardiomegaly without pericardial effusion. Normal caliber of the thoracic aorta. Mediastinum/Nodes: No enlarged mediastinal, hilar, or axillary lymph nodes. Thyroid gland, trachea, and esophagus demonstrate no significant findings. Lungs/Pleura: No acute airspace disease, effusion, or pneumothorax. Scattered hypoventilatory changes are seen at the lung bases. Sub solid 9 mm subpleural right lower lobe pulmonary nodule reference image 100/7 is noted, new since prior exam. Upper Abdomen: No acute abnormality. Musculoskeletal: No acute or destructive bony abnormalities. Reconstructed images demonstrate no additional findings. Review of the MIP images confirms the above findings. IMPRESSION: 1. Right lower lobe proximal segmental pulmonary embolus. Minimal clot burden with no evidence of right heart strain. 2. Stable cardiomegaly, with marked dilatation of the cardiac chambers again noted. No pericardial effusion. 3. Right part-solid pulmonary nodule measuring 9 mm. Per Fleischner Society Guidelines, recommend a non-contrast Chest CT at 3-6 months to confirm persistence. If unchanged and the solid component remains < 6 mm, an annual non-contrast Chest CT should be performed for 5 years. If the solid component is 6-8 mm on follow-up, recommend biopsy or resection. If the solid component is > 8 mm on follow-up, recommend PET/CT, biopsy or resection. These guidelines do not apply to immunocompromised patients and patients with  cancer. Follow up in patients with significant comorbidities as clinically warranted. For lung cancer screening, adhere to Lung-RADS guidelines. Reference: Radiology. 2017; 284(1):228-43. Critical Value/emergent results were called by  telephone at the time of interpretation on 06/30/2022 at 10:30 pm to provider [facility] , who verbally acknowledged these results. Electronically Signed   By: Sharlet Salina M.D.   On: 06/30/2022 22:31   DG Chest 2 View  Result Date: 06/30/2022 CLINICAL DATA:  Elevated temperature, cough and shortness of breath. EXAM: CHEST - 2 VIEW COMPARISON:  Chest radiograph dated June 18, 2021 FINDINGS: The heart is enlarged. Pulmonary vascular congestion and bilateral perihilar interstitial opacities suggesting mild pulmonary edema. No appreciable pleural effusion. IMPRESSION: Cardiomegaly with pulmonary vascular congestion and mild pulmonary edema. Electronically Signed   By: Larose Hires D.O.   On: 06/30/2022 18:50    Recent Results (from the past 2160 hour(s))  Basic metabolic panel     Status: Abnormal   Collection Time: 06/30/22  6:10 PM  Result Value Ref Range   Sodium 133 (L) 135 - 145 mmol/L   Potassium 3.6 3.5 - 5.1 mmol/L   Chloride 98 98 - 111 mmol/L   CO2 24 22 - 32 mmol/L   Glucose, Bld 139 (H) 70 - 99 mg/dL    Comment: Glucose reference range applies only to samples taken after fasting for at least 8 hours.   BUN 31 (H) 6 - 20 mg/dL   Creatinine, Ser 8.29 0.61 - 1.24 mg/dL   Calcium 8.0 (L) 8.9 - 10.3 mg/dL   GFR, Estimated >56 >21 mL/min    Comment: (NOTE) Calculated using the CKD-EPI Creatinine Equation (2021)    Anion gap 11 5 - 15    Comment: Performed at [facility], [address], [place], [place] [address]  CBC     Status: Abnormal   Collection Time: 06/30/22  6:10 PM  Result Value Ref Range   WBC 9.4 4.0 - 10.5 K/uL   RBC 4.20 (L) 4.22 - 5.81 MIL/uL   Hemoglobin 11.5 (L) 13.0 - 17.0 g/dL   HCT 78.4 (L) 69.6 - 29.5 %   MCV 85.5 80.0 - 100.0 fL   MCH 27.4 26.0 - 34.0 pg   MCHC 32.0 30.0 - 36.0 g/dL   RDW 28.4 13.2 - 44.0 %   Platelets 239 150 - 400 K/uL   nRBC 0.0 0.0 - 0.2 %    Comment: Performed at [facility], [address] [place], [place] [address]   Troponin I (High Sensitivity)     Status: Abnormal   Collection Time: 06/30/22  6:10 PM  Result Value Ref Range   Troponin I (High Sensitivity) 72 (H) <18 ng/L    Comment: (NOTE) Elevated high sensitivity troponin I (hsTnI) values and significant  changes across serial measurements may suggest ACS but many other  chronic and acute conditions are known to elevate hsTnI results.  Refer to the "Links" section for chest pain algorithms and additional  guidance. Performed at [facility], [address], [place], [place] [address]   Brain natriuretic peptide     Status: Abnormal   Collection Time: 06/30/22  6:10 PM  Result Value Ref Range   B Natriuretic Peptide 2,206.2 (H) 0.0 - 100.0 pg/mL    Comment: Performed at [facility], [address] [place], [place] [address]  Lactic acid, plasma     Status: None   Collection Time: 06/30/22  6:11 PM  Result Value Ref Range   Lactic Acid, Venous 1.8 0.5 - 1.9 mmol/L    Comment: Performed at [facility], [address] [place], [place] [address]  Blood culture (routine x 2)     Status: None   Collection Time: 06/30/22  6:43 PM   Specimen: BLOOD  Result Value Ref Range   Specimen Description BLOOD SITE NOT SPECIFIED    Special Requests      BOTTLES DRAWN AEROBIC AND ANAEROBIC Blood Culture adequate volume   Culture      NO GROWTH 5 DAYS Performed at [facility], [address] [place] [address], [place], [place] [address]    Report Status 07/05/2022 FINAL   Blood culture (routine x 2)     Status: None   Collection Time: 06/30/22  8:10 PM   Specimen: BLOOD  Result Value Ref Range   Specimen Description BLOOD RIGHT ANTECUBITAL    Special Requests      BOTTLES DRAWN AEROBIC AND ANAEROBIC Blood Culture results may not be optimal due to an excessive volume of blood received in culture bottles   Culture      NO GROWTH 5 DAYS Performed at [facility], [address] [place], [place] [address]    Report Status 07/05/2022  FINAL   Troponin I (High Sensitivity)     Status: Abnormal   Collection Time: 06/30/22  8:10 PM  Result Value Ref Range   Troponin I (High Sensitivity) 72 (H) <18 ng/L    Comment: (NOTE) Elevated high sensitivity troponin I (hsTnI) values and significant  changes across serial measurements may suggest ACS but many other  chronic and acute conditions are known to elevate hsTnI results.  Refer to the "Links" section for chest pain algorithms and additional  guidance. Performed at [facility], [address], [place], [place] [address]   Magnesium     Status: None   Collection Time: 06/30/22  8:10 PM  Result Value Ref Range   Magnesium 2.4 1.7 - 2.4 mg/dL    Comment: Performed at [facility], [address], [place], [place] [address]  SARS Coronavirus 2 by RT PCR (hospital order, performed in [facility] hospital lab) *cepheid single result test* Anterior Nasal Swab     Status: None   Collection Time: 06/30/22  9:01 PM   Specimen: Anterior Nasal Swab  Result Value Ref Range   SARS Coronavirus 2 by RT PCR NEGATIVE NEGATIVE    Comment: Performed at [facility], [address] [place], [place] [address]  Urinalysis, w/ Reflex to Culture (Infection Suspected) -Urine, Clean Catch     Status: Abnormal   Collection Time: 06/30/22 10:30 PM  Result Value Ref Range   Specimen Source URINE, CLEAN CATCH    Color, Urine AMBER (A) YELLOW    Comment: BIOCHEMICALS MAY BE AFFECTED BY COLOR   APPearance HAZY (A) CLEAR   Specific Gravity, Urine 1.024 1.005 - 1.030   pH 5.0 5.0 - 8.0   Glucose, UA NEGATIVE NEGATIVE mg/dL   Hgb urine dipstick SMALL (A) NEGATIVE   Bilirubin Urine NEGATIVE NEGATIVE   Ketones, ur NEGATIVE NEGATIVE mg/dL   Protein, ur 30 (A) NEGATIVE mg/dL   Nitrite NEGATIVE NEGATIVE   Leukocytes,Ua NEGATIVE NEGATIVE   RBC / HPF 0-5 0 - 5 RBC/hpf   WBC, UA 6-10 0 - 5 WBC/hpf    Comment:        Reflex urine culture not performed if WBC <=  10, OR if Squamous  epithelial cells >5. If Squamous epithelial cells >5 suggest recollection.    Bacteria, UA FEW (A) NONE SEEN   Squamous Epithelial / HPF 0-5 0 - 5 /HPF   Mucus PRESENT    Hyaline Casts, UA PRESENT    Sperm, UA PRESENT     Comment: Performed at [facility], [address], [place], [place] [address]  MRSA Next Gen by PCR, Nasal     Status: None   Collection Time: 07/01/22  2:39 AM   Specimen: Nasal Mucosa; Nasal Swab  Result Value Ref Range   MRSA by PCR Next Gen NOT DETECTED NOT DETECTED    Comment: (NOTE) The GeneXpert MRSA Assay (FDA approved for NASAL specimens only), is one component of a comprehensive MRSA colonization surveillance program. It is not intended to diagnose MRSA infection nor to guide or monitor treatment for MRSA infections. Test performance is not FDA approved in patients less than 60 years old. Performed at [facility], [address] [place], [place] [address]   CBC     Status: Abnormal   Collection Time: 07/01/22  3:36 AM  Result Value Ref Range   WBC 9.9 4.0 - 10.5 K/uL   RBC 4.10 (L) 4.22 - 5.81 MIL/uL   Hemoglobin 11.5 (L) 13.0 - 17.0 g/dL   HCT 41.3 (L) 24.4 - 01.0 %   MCV 84.9 80.0 - 100.0 fL   MCH 28.0 26.0 - 34.0 pg   MCHC 33.0 30.0 - 36.0 g/dL   RDW 27.2 53.6 - 64.4 %   Platelets 218 150 - 400 K/uL   nRBC 0.0 0.0 - 0.2 %    Comment: Performed at [facility], [address], [place], [place] [address]  Lactic acid, plasma     Status: None   Collection Time: 07/01/22  3:36 AM  Result Value Ref Range   Lactic Acid, Venous 1.8 0.5 - 1.9 mmol/L    Comment: Performed at [facility], [address] [place], [place] [address]  Magnesium     Status: None   Collection Time: 07/01/22  3:36 AM  Result Value Ref Range   Magnesium 2.3 1.7 - 2.4 mg/dL    Comment: Performed at [facility], [address], [place], [place] [address]  Phosphorus     Status: None   Collection Time: 07/01/22  3:36 AM  Result Value Ref  Range   Phosphorus 3.9 2.5 - 4.6 mg/dL    Comment: Performed at [facility], [address] [place], [place] [address]  APTT     Status: Abnormal   Collection Time: 07/01/22  3:36 AM  Result Value Ref Range   aPTT 38 (H) 24 - 36 seconds    Comment:        IF BASELINE aPTT IS ELEVATED, SUGGEST PATIENT RISK ASSESSMENT BE USED TO DETERMINE APPROPRIATE ANTICOAGULANT THERAPY. Performed at [facility], [address], [place], [place] [address]   Brain natriuretic peptide     Status: Abnormal   Collection Time: 07/01/22  3:37 AM  Result Value Ref Range   B Natriuretic Peptide 1,884.1 (H) 0.0 - 100.0 pg/mL    Comment: Performed at [facility], [address] [place], [place] [address]  Protime-INR     Status: Abnormal   Collection Time: 07/01/22  3:37 AM  Result Value Ref Range   Prothrombin Time 23.1 (H) 11.4 - 15.2 seconds  INR 2.0 (H) 0.8 - 1.2    Comment: (NOTE) INR goal varies based on device and disease states. Performed at [facility], [address], [place], [place] [address]   HIV Antibody (routine testing w rflx)     Status: None   Collection Time: 07/01/22  3:37 AM  Result Value Ref Range   HIV Screen 4th Generation wRfx Non Reactive Non Reactive    Comment: Performed at [facility], [address], [place], [place] [address]  Comprehensive metabolic panel     Status: Abnormal   Collection Time: 07/01/22  3:37 AM  Result Value Ref Range   Sodium 132 (L) 135 - 145 mmol/L   Potassium 3.6 3.5 - 5.1 mmol/L   Chloride 99 98 - 111 mmol/L   CO2 22 22 - 32 mmol/L   Glucose, Bld 145 (H) 70 - 99 mg/dL    Comment: Glucose reference range applies only to samples taken after fasting for at least 8 hours.   BUN 27 (H) 6 - 20 mg/dL   Creatinine, Ser 8.65 0.61 - 1.24 mg/dL   Calcium 7.8 (L) 8.9 - 10.3 mg/dL   Total Protein 6.6 6.5 - 8.1 g/dL   Albumin 3.0 (L) 3.5 - 5.0 g/dL   AST 784 (H) 15 - 41 U/L   ALT 448 (H) 0 - 44 U/L   Alkaline  Phosphatase 79 38 - 126 U/L   Total Bilirubin 3.1 (H) 0.3 - 1.2 mg/dL   GFR, Estimated >69 >62 mL/min    Comment: (NOTE) Calculated using the CKD-EPI Creatinine Equation (2021)    Anion gap 11 5 - 15    Comment: Performed at [facility], [address], [place], [place] [address]  I-STAT 7, (LYTES, BLD GAS, ICA, H+H)     Status: Abnormal   Collection Time: 07/01/22  3:50 AM  Result Value Ref Range   pH, Arterial 7.419 7.35 - 7.45   pCO2 arterial 35.5 32 - 48 mmHg   pO2, Arterial 97 83 - 108 mmHg   Bicarbonate 23.0 20.0 - 28.0 mmol/L   TCO2 24 22 - 32 mmol/L   O2 Saturation 98 %   Acid-base deficit 1.0 0.0 - 2.0 mmol/L   Sodium 135 135 - 145 mmol/L   Potassium 3.5 3.5 - 5.1 mmol/L   Calcium, Ion 1.07 (L) 1.15 - 1.40 mmol/L   HCT 36.0 (L) 39.0 - 52.0 %   Hemoglobin 12.2 (L) 13.0 - 17.0 g/dL   Patient temperature 13.2 F    Collection site RADIAL, ALLEN'S TEST ACCEPTABLE    Drawn by RT    Sample type ARTERIAL   Glucose, capillary     Status: Abnormal   Collection Time: 07/01/22  3:50 AM  Result Value Ref Range   Glucose-Capillary 141 (H) 70 - 99 mg/dL    Comment: Glucose reference range applies only to samples taken after fasting for at least 8 hours.  Lactic acid, plasma     Status: None   Collection Time: 07/01/22  7:02 AM  Result Value Ref Range   Lactic Acid, Venous 1.7 0.5 - 1.9 mmol/L    Comment: Performed at [facility], [address], [place], [place] [address]  Troponin I (High Sensitivity)     Status: Abnormal   Collection Time: 07/01/22  7:02 AM  Result Value Ref Range   Troponin I (High Sensitivity) 63 (H) <18 ng/L    Comment: (NOTE) Elevated high sensitivity troponin  I (hsTnI) values and significant  changes across serial measurements may suggest ACS but many other  chronic and acute conditions are known to elevate hsTnI results.  Refer to the "Links" section for chest pain algorithms and additional  guidance. Performed at [facility], [address] [place], [place] [address]   Basic metabolic panel     Status: Abnormal   Collection Time: 07/01/22  7:02 AM  Result Value Ref Range   Sodium 135 135 - 145 mmol/L   Potassium 3.7 3.5 - 5.1 mmol/L   Chloride 97 (L) 98 - 111 mmol/L   CO2 23 22 - 32 mmol/L   Glucose, Bld 125 (H) 70 - 99 mg/dL    Comment: Glucose reference range applies only to samples taken after fasting for at least 8 hours.   BUN 26 (H) 6 - 20 mg/dL   Creatinine, Ser 6.04 0.61 - 1.24 mg/dL   Calcium 7.8 (L) 8.9 - 10.3 mg/dL   GFR, Estimated >54 >09 mL/min    Comment: (NOTE) Calculated using the CKD-EPI Creatinine Equation (2021)    Anion gap 15 5 - 15    Comment: Performed at [facility], [address], [place], [place] [address]  Magnesium     Status: None   Collection Time: 07/01/22  7:02 AM  Result Value Ref Range   Magnesium 2.1 1.7 - 2.4 mg/dL    Comment: Performed at [facility], [address], [place], [place] [address]  Phosphorus     Status: None   Collection Time: 07/01/22  7:02 AM  Result Value Ref Range   Phosphorus 3.6 2.5 - 4.6 mg/dL    Comment: Performed at [facility], [address], [place], [place] [address]  ECHOCARDIOGRAM COMPLETE     Status: None   Collection Time: 07/01/22  9:28 AM  Result Value Ref Range   Weight 4,402.15 oz   Height 74 in   BP 97/73 mmHg   Single Plane A4C EF 24.6 %   S' Lateral 6.70 cm   Area-P 1/2 3.60 cm2   Single Plane A2C EF 6.0 %   Calc EF 18.6 %   MV M vel 3.54 m/s   Radius 0.60 cm   MV Peak grad 50.1 mmHg   Est EF < 20%   Heparin level (unfractionated)     Status: Abnormal   Collection Time: 07/01/22 11:27 AM  Result Value Ref Range   Heparin Unfractionated <0.10 (L) 0.30 - 0.70 IU/mL    Comment: (NOTE) The clinical reportable range upper limit is being lowered to >1.10 to align with the FDA approved guidance for the current laboratory assay.  If heparin results are below expected values, and patient dosage  has  been confirmed, suggest follow up testing of antithrombin III levels. Performed at [facility], [address] [place], [place] [address]   Fibrinogen     Status: Abnormal   Collection Time: 07/01/22 11:27 AM  Result Value Ref Range   Fibrinogen 143 (L) 210 - 475 mg/dL    Comment: (NOTE) Fibrinogen results may be underestimated in patients receiving thrombolytic therapy. Performed at [facility], [address], [place], [place] [address]   Glucose, capillary     Status: Abnormal   Collection Time: 07/01/22  4:11 PM  Result Value Ref Range   Glucose-Capillary 147 (H) 70 - 99 mg/dL    Comment: Glucose reference range applies only to samples  taken after fasting for at least 8 hours.  Cooxemetry Panel (carboxy, met, total hgb, O2 sat)     Status: None   Collection Time: 07/01/22  4:12 PM  Result Value Ref Range   Total hemoglobin 12.5 12.0 - 16.0 g/dL   O2 Saturation 54.0 %   Carboxyhemoglobin 1.4 0.5 - 1.5 %   Methemoglobin <0.7 0.0 - 1.5 %    Comment: Performed at [facility], [address] [place], [place] [address]  Glucose, capillary     Status: Abnormal   Collection Time: 07/01/22  7:31 PM  Result Value Ref Range   Glucose-Capillary 159 (H) 70 - 99 mg/dL    Comment: Glucose reference range applies only to samples taken after fasting for at least 8 hours.  Cooxemetry Panel (carboxy, met, total hgb, O2 sat)     Status: Abnormal   Collection Time: 07/01/22  7:33 PM  Result Value Ref Range   Total hemoglobin 11.9 (L) 12.0 - 16.0 g/dL   O2 Saturation 74 %   Carboxyhemoglobin 2.3 (H) 0.5 - 1.5 %   Methemoglobin <0.7 0.0 - 1.5 %    Comment: Performed at [facility], [address] [place], [place] [address]  Fibrinogen     Status: Abnormal   Collection Time: 07/01/22  9:14 PM  Result Value Ref Range   Fibrinogen 142 (L) 210 - 475 mg/dL    Comment: (NOTE) Fibrinogen results may be underestimated in patients receiving thrombolytic  therapy. Performed at [facility], [address] [place], [place] [address]   Heparin level (unfractionated)     Status: Abnormal   Collection Time: 07/01/22  9:14 PM  Result Value Ref Range   Heparin Unfractionated 0.19 (L) 0.30 - 0.70 IU/mL    Comment: (NOTE) The clinical reportable range upper limit is being lowered to >1.10 to align with the FDA approved guidance for the current laboratory assay.  If heparin results are below expected values, and patient dosage has  been confirmed, suggest follow up testing of antithrombin III levels. Performed at [facility], [address], [place], [place] [address]   Glucose, capillary     Status: Abnormal   Collection Time: 07/01/22 11:34 PM  Result Value Ref Range   Glucose-Capillary 159 (H) 70 - 99 mg/dL    Comment: Glucose reference range applies only to samples taken after fasting for at least 8 hours.  Glucose, capillary     Status: Abnormal   Collection Time: 07/02/22 12:24 AM  Result Value Ref Range   Glucose-Capillary 184 (H) 70 - 99 mg/dL    Comment: Glucose reference range applies only to samples taken after fasting for at least 8 hours.  CBC     Status: Abnormal   Collection Time: 07/02/22  3:54 AM  Result Value Ref Range   WBC 10.8 (H) 4.0 - 10.5 K/uL   RBC 4.14 (L) 4.22 - 5.81 MIL/uL   Hemoglobin 11.2 (L) 13.0 - 17.0 g/dL   HCT 78.4 (L) 69.6 - 29.5 %   MCV 87.4 80.0 - 100.0 fL   MCH 27.1 26.0 - 34.0 pg   MCHC 30.9 30.0 - 36.0 g/dL   RDW 28.4 13.2 - 44.0 %   Platelets 198 150 - 400 K/uL   nRBC 0.2 0.0 - 0.2 %    Comment: Performed at [facility], [address], [place], [place] [address]  Basic metabolic panel     Status: Abnormal  Collection Time: 07/02/22  3:54 AM  Result Value Ref Range   Sodium 133 (L) 135 - 145 mmol/L   Potassium 4.0 3.5 - 5.1 mmol/L   Chloride 99 98 - 111 mmol/L   CO2 24 22 - 32 mmol/L   Glucose, Bld 139 (H) 70 - 99 mg/dL    Comment: Glucose reference range applies  only to samples taken after fasting for at least 8 hours.   BUN 28 (H) 6 - 20 mg/dL   Creatinine, Ser 1.61 0.61 - 1.24 mg/dL   Calcium 7.7 (L) 8.9 - 10.3 mg/dL   GFR, Estimated >09 >60 mL/min    Comment: (NOTE) Calculated using the CKD-EPI Creatinine Equation (2021)    Anion gap 10 5 - 15    Comment: Performed at [facility], [address], [place], [place] [address]  Magnesium     Status: None   Collection Time: 07/02/22  3:54 AM  Result Value Ref Range   Magnesium 2.2 1.7 - 2.4 mg/dL    Comment: Performed at [facility], [address] [place], [place] [address]  Phosphorus     Status: None   Collection Time: 07/02/22  3:54 AM  Result Value Ref Range   Phosphorus 3.4 2.5 - 4.6 mg/dL    Comment: Performed at [facility], [address], [place], [place] [address]  Hepatic function panel     Status: Abnormal   Collection Time: 07/02/22  3:54 AM  Result Value Ref Range   Total Protein 6.4 (L) 6.5 - 8.1 g/dL   Albumin 2.8 (L) 3.5 - 5.0 g/dL   AST 562 (H) 15 - 41 U/L   ALT 938 (H) 0 - 44 U/L   Alkaline Phosphatase 73 38 - 126 U/L   Total Bilirubin 2.0 (H) 0.3 - 1.2 mg/dL   Bilirubin, Direct 0.8 (H) 0.0 - 0.2 mg/dL   Indirect Bilirubin 1.2 (H) 0.3 - 0.9 mg/dL    Comment: Performed at [facility] Lab, [address] [place], [place] [address]  Glucose, capillary     Status: Abnormal   Collection Time: 07/02/22  3:59 AM  Result Value Ref Range   Glucose-Capillary 131 (H) 70 - 99 mg/dL    Comment: Glucose reference range applies only to samples taken after fasting for at least 8 hours.  Cooxemetry Panel (carboxy, met, total hgb, O2 sat)     Status: Abnormal   Collection Time: 07/02/22  5:12 AM  Result Value Ref Range   Total hemoglobin 11.9 (L) 12.0 - 16.0 g/dL   O2 Saturation 57.8 %   Carboxyhemoglobin 1.9 (H) 0.5 - 1.5 %   Methemoglobin <0.7 0.0 - 1.5 %    Comment: Performed at [facility], [address], [place], [place] [address]  Heparin  level (unfractionated)     Status: Abnormal   Collection Time: 07/02/22  5:14 AM  Result Value Ref Range   Heparin Unfractionated 0.14 (L) 0.30 - 0.70 IU/mL    Comment: (NOTE) The clinical reportable range upper limit is being lowered to >1.10 to align with the FDA approved guidance for the current laboratory assay.  If heparin results are below expected values, and patient dosage has  been confirmed, suggest follow up testing of antithrombin III levels. Performed at [facility], [address] [place], [place] [address]   Cooxemetry Panel (carboxy, met, total hgb, O2 sat)     Status: Abnormal   Collection Time:  07/02/22  6:15 AM  Result Value Ref Range   Total hemoglobin 11.5 (L) 12.0 - 16.0 g/dL   O2 Saturation 01.0 %   Carboxyhemoglobin 1.7 (H) 0.5 - 1.5 %   Methemoglobin <0.7 0.0 - 1.5 %    Comment: Performed at [facility], [address], [place], [place] [address]  Glucose, capillary     Status: Abnormal   Collection Time: 07/02/22  7:53 AM  Result Value Ref Range   Glucose-Capillary 154 (H) 70 - 99 mg/dL    Comment: Glucose reference range applies only to samples taken after fasting for at least 8 hours.  Glucose, capillary     Status: Abnormal   Collection Time: 07/02/22 11:37 AM  Result Value Ref Range   Glucose-Capillary 242 (H) 70 - 99 mg/dL    Comment: Glucose reference range applies only to samples taken after fasting for at least 8 hours.  Heparin level (unfractionated)     Status: None   Collection Time: 07/02/22  1:15 PM  Result Value Ref Range   Heparin Unfractionated 0.47 0.30 - 0.70 IU/mL    Comment: (NOTE) The clinical reportable range upper limit is being lowered to >1.10 to align with the FDA approved guidance for the current laboratory assay.  If heparin results are below expected values, and patient dosage has  been confirmed, suggest follow up testing of antithrombin III levels. Performed at [facility], [address]  [place], [place] [address]   Glucose, capillary     Status: Abnormal   Collection Time: 07/02/22  3:43 PM  Result Value Ref Range   Glucose-Capillary 147 (H) 70 - 99 mg/dL    Comment: Glucose reference range applies only to samples taken after fasting for at least 8 hours.  Heparin level (unfractionated)     Status: None   Collection Time: 07/02/22  8:12 PM  Result Value Ref Range   Heparin Unfractionated 0.32 0.30 - 0.70 IU/mL    Comment: (NOTE) The clinical reportable range upper limit is being lowered to >1.10 to align with the FDA approved guidance for the current laboratory assay.  If heparin results are below expected values, and patient dosage has  been confirmed, suggest follow up testing of antithrombin III levels. Performed at [facility], [address] [place], [place] [address]   Glucose, capillary     Status: Abnormal   Collection Time: 07/02/22  8:25 PM  Result Value Ref Range   Glucose-Capillary 162 (H) 70 - 99 mg/dL    Comment: Glucose reference range applies only to samples taken after fasting for at least 8 hours.  Glucose, capillary     Status: Abnormal   Collection Time: 07/02/22 11:54 PM  Result Value Ref Range   Glucose-Capillary 133 (H) 70 - 99 mg/dL    Comment: Glucose reference range applies only to samples taken after fasting for at least 8 hours.  Glucose, capillary     Status: Abnormal   Collection Time: 07/03/22  3:58 AM  Result Value Ref Range   Glucose-Capillary 138 (H) 70 - 99 mg/dL    Comment: Glucose reference range applies only to samples taken after fasting for at least 8 hours.  CBC     Status: Abnormal   Collection Time: 07/03/22  4:59 AM  Result Value Ref Range   WBC 10.7 (H) 4.0 - 10.5 K/uL   RBC 3.99 (L) 4.22 - 5.81 MIL/uL   Hemoglobin 10.7 (L) 13.0 - 17.0 g/dL  HCT 34.8 (L) 39.0 - 52.0 %   MCV 87.2 80.0 - 100.0 fL   MCH 26.8 26.0 - 34.0 pg   MCHC 30.7 30.0 - 36.0 g/dL   RDW 19.1 47.8 - 29.5 %   Platelets 191 150 - 400 K/uL    nRBC 0.0 0.0 - 0.2 %    Comment: Performed at [facility], [address], [place], [place] [address]  Heparin level (unfractionated)     Status: None   Collection Time: 07/03/22  4:59 AM  Result Value Ref Range   Heparin Unfractionated 0.55 0.30 - 0.70 IU/mL    Comment: (NOTE) The clinical reportable range upper limit is being lowered to >1.10 to align with the FDA approved guidance for the current laboratory assay.  If heparin results are below expected values, and patient dosage has  been confirmed, suggest follow up testing of antithrombin III levels. Performed at [facility], [address], [place], [place] [address]   Hepatic function panel     Status: Abnormal   Collection Time: 07/03/22  5:01 AM  Result Value Ref Range   Total Protein 6.2 (L) 6.5 - 8.1 g/dL   Albumin 2.7 (L) 3.5 - 5.0 g/dL   AST 469 (H) 15 - 41 U/L   ALT 804 (H) 0 - 44 U/L   Alkaline Phosphatase 71 38 - 126 U/L   Total Bilirubin 1.7 (H) 0.3 - 1.2 mg/dL   Bilirubin, Direct 0.7 (H) 0.0 - 0.2 mg/dL   Indirect Bilirubin 1.0 (H) 0.3 - 0.9 mg/dL    Comment: Performed at [facility], [address], [place], [place] [address]  Basic metabolic panel     Status: Abnormal   Collection Time: 07/03/22  5:08 AM  Result Value Ref Range   Sodium 131 (L) 135 - 145 mmol/L   Potassium 3.8 3.5 - 5.1 mmol/L   Chloride 96 (L) 98 - 111 mmol/L   CO2 26 22 - 32 mmol/L   Glucose, Bld 133 (H) 70 - 99 mg/dL    Comment: Glucose reference range applies only to samples taken after fasting for at least 8 hours.   BUN 24 (H) 6 - 20 mg/dL   Creatinine, Ser 8.41 0.61 - 1.24 mg/dL   Calcium 7.7 (L) 8.9 - 10.3 mg/dL   GFR, Estimated >32 >44 mL/min    Comment: (NOTE) Calculated using the CKD-EPI Creatinine Equation (2021)    Anion gap 9 5 - 15    Comment: Performed at [facility], [address] [place], [place] [address]  Magnesium     Status: None   Collection Time: 07/03/22  5:08 AM  Result  Value Ref Range   Magnesium 2.0 1.7 - 2.4 mg/dL    Comment: Performed at [facility], [address] [place], [place] [address]  Glucose, capillary     Status: Abnormal   Collection Time: 07/03/22  7:38 AM  Result Value Ref Range   Glucose-Capillary 162 (H) 70 - 99 mg/dL    Comment: Glucose reference range applies only to samples taken after fasting for at least 8 hours.  Glucose, capillary     Status: Abnormal   Collection Time: 07/03/22 11:35 AM  Result Value Ref Range   Glucose-Capillary 164 (H) 70 - 99 mg/dL    Comment: Glucose reference range applies only to samples taken after fasting for at least 8 hours.  Glucose, capillary     Status: Abnormal  Collection Time: 07/03/22 11:35 AM  Result Value Ref Range   Glucose-Capillary 164 (H) 70 - 99 mg/dL    Comment: Glucose reference range applies only to samples taken after fasting for at least 8 hours.  Cooxemetry Panel (carboxy, met, total hgb, O2 sat)     Status: Abnormal   Collection Time: 07/03/22 11:41 AM  Result Value Ref Range   Total hemoglobin 11.5 (L) 12.0 - 16.0 g/dL   O2 Saturation 29.5 %   Carboxyhemoglobin 1.9 (H) 0.5 - 1.5 %   Methemoglobin <0.7 0.0 - 1.5 %    Comment: Performed at [facility], [address] [place], [place] [address]  Glucose, capillary     Status: Abnormal   Collection Time: 07/03/22  3:46 PM  Result Value Ref Range   Glucose-Capillary 136 (H) 70 - 99 mg/dL    Comment: Glucose reference range applies only to samples taken after fasting for at least 8 hours.  Glucose, capillary     Status: Abnormal   Collection Time: 07/03/22  7:54 PM  Result Value Ref Range   Glucose-Capillary 202 (H) 70 - 99 mg/dL    Comment: Glucose reference range applies only to samples taken after fasting for at least 8 hours.  Glucose, capillary     Status: Abnormal   Collection Time: 07/03/22 11:34 PM  Result Value Ref Range   Glucose-Capillary 128 (H) 70 - 99 mg/dL    Comment: Glucose reference range  applies only to samples taken after fasting for at least 8 hours.  Glucose, capillary     Status: Abnormal   Collection Time: 07/04/22  3:39 AM  Result Value Ref Range   Glucose-Capillary 129 (H) 70 - 99 mg/dL    Comment: Glucose reference range applies only to samples taken after fasting for at least 8 hours.  CBC     Status: Abnormal   Collection Time: 07/04/22  5:04 AM  Result Value Ref Range   WBC 9.7 4.0 - 10.5 K/uL   RBC 4.03 (L) 4.22 - 5.81 MIL/uL   Hemoglobin 11.0 (L) 13.0 - 17.0 g/dL   HCT 86.5 (L) 78.4 - 69.6 %   MCV 86.1 80.0 - 100.0 fL   MCH 27.3 26.0 - 34.0 pg   MCHC 31.7 30.0 - 36.0 g/dL   RDW 29.5 28.4 - 13.2 %   Platelets 204 150 - 400 K/uL   nRBC 0.0 0.0 - 0.2 %    Comment: Performed at [facility], [address], [place], [place] [address]  Heparin level (unfractionated)     Status: None   Collection Time: 07/04/22  5:04 AM  Result Value Ref Range   Heparin Unfractionated 0.44 0.30 - 0.70 IU/mL    Comment: (NOTE) The clinical reportable range upper limit is being lowered to >1.10 to align with the FDA approved guidance for the current laboratory assay.  If heparin results are below expected values, and patient dosage has  been confirmed, suggest follow up testing of antithrombin III levels. Performed at [facility], [address], [place], [place] [address]   Cooxemetry Panel (carboxy, met, total hgb, O2 sat)     Status: Abnormal   Collection Time: 07/04/22  5:04 AM  Result Value Ref Range   Total hemoglobin 11.3 (L) 12.0 - 16.0 g/dL   O2 Saturation 66.4 %   Carboxyhemoglobin 2.0 (H) 0.5 - 1.5 %   Methemoglobin <0.7 0.0 - 1.5 %    Comment: Performed at  [facility], [address] [place], [place], [place] [address]  Comprehensive metabolic panel     Status: Abnormal   Collection Time: 07/04/22  5:04 AM  Result Value Ref Range   Sodium 131 (L) 135 - 145 mmol/L   Potassium 3.2 (L) 3.5 - 5.1 mmol/L   Chloride 93 (L) 98 - 111 mmol/L   CO2  30 22 - 32 mmol/L   Glucose, Bld 130 (H) 70 - 99 mg/dL    Comment: Glucose reference range applies only to samples taken after fasting for at least 8 hours.   BUN 14 6 - 20 mg/dL   Creatinine, Ser 6.21 0.61 - 1.24 mg/dL   Calcium 7.6 (L) 8.9 - 10.3 mg/dL   Total Protein 6.0 (L) 6.5 - 8.1 g/dL   Albumin 2.5 (L) 3.5 - 5.0 g/dL   AST 308 (H) 15 - 41 U/L   ALT 596 (H) 0 - 44 U/L   Alkaline Phosphatase 64 38 - 126 U/L   Total Bilirubin 1.8 (H) 0.3 - 1.2 mg/dL   GFR, Estimated >65 >78 mL/min    Comment: (NOTE) Calculated using the CKD-EPI Creatinine Equation (2021)    Anion gap 8 5 - 15    Comment: Performed at [facility], [address], [place], [place] [address]  Glucose, capillary     Status: Abnormal   Collection Time: 07/04/22  7:48 AM  Result Value Ref Range   Glucose-Capillary 180 (H) 70 - 99 mg/dL    Comment: Glucose reference range applies only to samples taken after fasting for at least 8 hours.  Glucose, capillary     Status: None   Collection Time: 07/04/22 11:23 AM  Result Value Ref Range   Glucose-Capillary 93 70 - 99 mg/dL    Comment: Glucose reference range applies only to samples taken after fasting for at least 8 hours.  Glucose, capillary     Status: Abnormal   Collection Time: 07/04/22  3:32 PM  Result Value Ref Range   Glucose-Capillary 123 (H) 70 - 99 mg/dL    Comment: Glucose reference range applies only to samples taken after fasting for at least 8 hours.  Glucose, capillary     Status: Abnormal   Collection Time: 07/04/22  7:46 PM  Result Value Ref Range   Glucose-Capillary 135 (H) 70 - 99 mg/dL    Comment: Glucose reference range applies only to samples taken after fasting for at least 8 hours.   Comment 1 Notify RN   Glucose, capillary     Status: None   Collection Time: 07/04/22 11:46 PM  Result Value Ref Range   Glucose-Capillary 99 70 - 99 mg/dL    Comment: Glucose reference range applies only to samples taken after fasting for at least 8  hours.  Glucose, capillary     Status: Abnormal   Collection Time: 07/05/22  4:49 AM  Result Value Ref Range   Glucose-Capillary 107 (H) 70 - 99 mg/dL    Comment: Glucose reference range applies only to samples taken after fasting for at least 8 hours.  CBC     Status: Abnormal   Collection Time: 07/05/22  5:00 AM  Result Value Ref Range   WBC 8.7 4.0 - 10.5 K/uL   RBC 3.90 (L) 4.22 - 5.81 MIL/uL   Hemoglobin 10.6 (L) 13.0 - 17.0 g/dL   HCT 95.2 (L) 84.1 - 32.4 %   MCV 87.2 80.0 - 100.0 fL   MCH 27.2 26.0 -  34.0 pg   MCHC 31.2 30.0 - 36.0 g/dL   RDW 62.9 52.8 - 41.3 %   Platelets 200 150 - 400 K/uL   nRBC 0.0 0.0 - 0.2 %    Comment: Performed at [facility], [address], [place], [place] [address]  Heparin level (unfractionated)     Status: None   Collection Time: 07/05/22  5:00 AM  Result Value Ref Range   Heparin Unfractionated 0.54 0.30 - 0.70 IU/mL    Comment: (NOTE) The clinical reportable range upper limit is being lowered to >1.10 to align with the FDA approved guidance for the current laboratory assay.  If heparin results are below expected values, and patient dosage has  been confirmed, suggest follow up testing of antithrombin III levels. Performed at [facility], [address] [place], [place] [address]   Basic metabolic panel     Status: Abnormal   Collection Time: 07/05/22  5:00 AM  Result Value Ref Range   Sodium 136 135 - 145 mmol/L   Potassium 3.6 3.5 - 5.1 mmol/L   Chloride 94 (L) 98 - 111 mmol/L   CO2 30 22 - 32 mmol/L   Glucose, Bld 100 (H) 70 - 99 mg/dL    Comment: Glucose reference range applies only to samples taken after fasting for at least 8 hours.   BUN 9 6 - 20 mg/dL   Creatinine, Ser 3.66 0.61 - 1.24 mg/dL   Calcium 7.9 (L) 8.9 - 10.3 mg/dL   GFR, Estimated >44 >03 mL/min    Comment: (NOTE) Calculated using the CKD-EPI Creatinine Equation (2021)    Anion gap 12 5 - 15    Comment: Performed at [facility],  [address] [place] [address] [address], [place], [place] [address]  Cooxemetry Panel (carboxy, met, total hgb, O2 sat)     Status: Abnormal   Collection Time: 07/05/22  5:25 AM  Result Value Ref Range   Total hemoglobin 11.2 (L) 12.0 - 16.0 g/dL   O2 Saturation 95.6 %   Carboxyhemoglobin 2.8 (H) 0.5 - 1.5 %   Methemoglobin <0.7 0.0 - 1.5 %    Comment: Performed at [facility], [address], [place], [place] [address]  Glucose, capillary     Status: Abnormal   Collection Time: 07/05/22  8:29 AM  Result Value Ref Range   Glucose-Capillary 143 (H) 70 - 99 mg/dL    Comment: Glucose reference range applies only to samples taken after fasting for at least 8 hours.  Glucose, capillary     Status: Abnormal   Collection Time: 07/05/22 11:21 AM  Result Value Ref Range   Glucose-Capillary 107 (H) 70 - 99 mg/dL    Comment: Glucose reference range applies only to samples taken after fasting for at least 8 hours.  Glucose, capillary     Status: Abnormal   Collection Time: 07/05/22  3:33 PM  Result Value Ref Range   Glucose-Capillary 131 (H) 70 - 99 mg/dL    Comment: Glucose reference range applies only to samples taken after fasting for at least 8 hours.  Glucose, capillary     Status: Abnormal   Collection Time: 07/05/22  8:00 PM  Result Value Ref Range   Glucose-Capillary 125 (H) 70 - 99 mg/dL    Comment: Glucose reference range applies only to samples taken after fasting for at least 8 hours.  Glucose, capillary     Status: Abnormal   Collection Time: 07/05/22 11:39 PM  Result  Value Ref Range   Glucose-Capillary 147 (H) 70 - 99 mg/dL    Comment: Glucose reference range applies only to samples taken after fasting for at least 8 hours.  Glucose, capillary     Status: None   Collection Time: 07/06/22  3:54 AM  Result Value Ref Range   Glucose-Capillary 96 70 - 99 mg/dL    Comment: Glucose reference range applies only to samples taken after fasting for at least 8 hours.  Cooxemetry Panel (carboxy, met, total  hgb, O2 sat)     Status: Abnormal   Collection Time: 07/06/22  4:44 AM  Result Value Ref Range   Total hemoglobin 11.2 (L) 12.0 - 16.0 g/dL   O2 Saturation 16.1 %   Carboxyhemoglobin 2.5 (H) 0.5 - 1.5 %   Methemoglobin 0.7 0.0 - 1.5 %    Comment: Performed at [facility], [address] [place], [place] [address]  CBC     Status: Abnormal   Collection Time: 07/06/22  5:00 AM  Result Value Ref Range   WBC 6.8 4.0 - 10.5 K/uL   RBC 3.92 (L) 4.22 - 5.81 MIL/uL   Hemoglobin 10.8 (L) 13.0 - 17.0 g/dL   HCT 54.0 (L) 98.1 - 19.1 %   MCV 89.5 80.0 - 100.0 fL   MCH 27.6 26.0 - 34.0 pg   MCHC 30.8 30.0 - 36.0 g/dL   RDW 47.8 29.5 - 62.1 %   Platelets 223 150 - 400 K/uL   nRBC 0.0 0.0 - 0.2 %    Comment: Performed at [facility], [address] [place], [place] [address]  Basic metabolic panel     Status: Abnormal   Collection Time: 07/06/22  5:00 AM  Result Value Ref Range   Sodium 135 135 - 145 mmol/L   Potassium 3.8 3.5 - 5.1 mmol/L   Chloride 98 98 - 111 mmol/L   CO2 30 22 - 32 mmol/L   Glucose, Bld 95 70 - 99 mg/dL    Comment: Glucose reference range applies only to samples taken after fasting for at least 8 hours.   BUN 9 6 - 20 mg/dL   Creatinine, Ser 7.84 0.61 - 1.24 mg/dL   Calcium 8.0 (L) 8.9 - 10.3 mg/dL   GFR, Estimated >69 >62 mL/min    Comment: (NOTE) Calculated using the CKD-EPI Creatinine Equation (2021)    Anion gap 7 5 - 15    Comment: Performed at [facility], [address] [place], [place] [address]  Glucose, capillary     Status: Abnormal   Collection Time: 07/06/22  8:07 AM  Result Value Ref Range   Glucose-Capillary 129 (H) 70 - 99 mg/dL    Comment: Glucose reference range applies only to samples taken after fasting for at least 8 hours.  Glucose, capillary     Status: Abnormal   Collection Time: 07/06/22 11:50 AM  Result Value Ref Range   Glucose-Capillary 122 (H) 70 - 99 mg/dL    Comment: Glucose reference range applies only to  samples taken after fasting for at least 8 hours.  Glucose, capillary     Status: Abnormal   Collection Time: 07/06/22  4:16 PM  Result Value Ref Range   Glucose-Capillary 101 (H) 70 - 99 mg/dL    Comment: Glucose reference range applies only to samples taken after fasting for at least 8 hours.  Glucose, capillary     Status: Abnormal   Collection Time: 07/06/22  8:12  PM  Result Value Ref Range   Glucose-Capillary 175 (H) 70 - 99 mg/dL    Comment: Glucose reference range applies only to samples taken after fasting for at least 8 hours.  Glucose, capillary     Status: Abnormal   Collection Time: 07/07/22 12:06 AM  Result Value Ref Range   Glucose-Capillary 109 (H) 70 - 99 mg/dL    Comment: Glucose reference range applies only to samples taken after fasting for at least 8 hours.  Glucose, capillary     Status: Abnormal   Collection Time: 07/07/22  3:34 AM  Result Value Ref Range   Glucose-Capillary 121 (H) 70 - 99 mg/dL    Comment: Glucose reference range applies only to samples taken after fasting for at least 8 hours.  CBC     Status: Abnormal   Collection Time: 07/07/22  4:12 AM  Result Value Ref Range   WBC 6.6 4.0 - 10.5 K/uL   RBC 4.07 (L) 4.22 - 5.81 MIL/uL   Hemoglobin 10.9 (L) 13.0 - 17.0 g/dL   HCT 21.3 (L) 08.6 - 57.8 %   MCV 88.9 80.0 - 100.0 fL   MCH 26.8 26.0 - 34.0 pg   MCHC 30.1 30.0 - 36.0 g/dL   RDW 46.9 62.9 - 52.8 %   Platelets 263 150 - 400 K/uL   nRBC 0.0 0.0 - 0.2 %    Comment: Performed at [facility], [address] [place], [place] [address]  Cooxemetry Panel (carboxy, met, total hgb, O2 sat)     Status: Abnormal   Collection Time: 07/07/22  4:12 AM  Result Value Ref Range   Total hemoglobin 11.5 (L) 12.0 - 16.0 g/dL   O2 Saturation 40.1 %   Carboxyhemoglobin 2.1 (H) 0.5 - 1.5 %   Methemoglobin 0.9 0.0 - 1.5 %    Comment: Performed at [facility], [address], [place], [place] [address]  Basic metabolic panel     Status: Abnormal    Collection Time: 07/07/22  4:12 AM  Result Value Ref Range   Sodium 136 135 - 145 mmol/L   Potassium 4.3 3.5 - 5.1 mmol/L   Chloride 99 98 - 111 mmol/L   CO2 30 22 - 32 mmol/L   Glucose, Bld 109 (H) 70 - 99 mg/dL    Comment: Glucose reference range applies only to samples taken after fasting for at least 8 hours.   BUN 10 6 - 20 mg/dL   Creatinine, Ser 3.66 0.61 - 1.24 mg/dL   Calcium 8.4 (L) 8.9 - 10.3 mg/dL   GFR, Estimated >44 >03 mL/min    Comment: (NOTE) Calculated using the CKD-EPI Creatinine Equation (2021)    Anion gap 7 5 - 15    Comment: Performed at [facility], [address] [place], [place] [address]  Digoxin level     Status: Abnormal   Collection Time: 07/07/22  4:12 AM  Result Value Ref Range   Digoxin Level 0.5 (L) 0.8 - 2.0 ng/mL    Comment: Performed at [facility], [address], [place], [place] [address]  Magnesium     Status: None   Collection Time: 07/07/22  4:12 AM  Result Value Ref Range   Magnesium 2.2 1.7 - 2.4 mg/dL    Comment: Performed at [facility], [address] [place], [place] [address]  Glucose, capillary     Status: Abnormal   Collection Time: 07/07/22 11:03 AM  Result Value Ref Range   Glucose-Capillary 111 (H) 70 - 99 mg/dL    Comment: Glucose reference range applies only to samples taken after fasting for at least 8 hours.  Basic Metabolic Panel (BMET)     Status: Abnormal   Collection Time: 07/18/22 11:15 AM  Result Value Ref Range   Sodium 141 135 - 145 mmol/L   Potassium 3.9 3.5 - 5.1 mmol/L   Chloride 101 98 - 111 mmol/L   CO2 29 22 - 32 mmol/L   Glucose, Bld 125 (H) 70 - 99 mg/dL    Comment: Glucose reference range applies only to samples taken after fasting for at least 8 hours.   BUN 17 6 - 20 mg/dL   Creatinine, Ser 1.61 0.61 - 1.24 mg/dL   Calcium 9.2 8.9 - 09.6 mg/dL   GFR, Estimated >04 >54 mL/min    Comment: (NOTE) Calculated using the CKD-EPI Creatinine Equation (2021)    Anion gap 11 5 -  15    Comment: Performed at [facility], [address] [place], [place] [address]  B Nat Peptide     Status: Abnormal   Collection Time: 07/18/22 11:15 AM  Result Value Ref Range   B Natriuretic Peptide 963.1 (H) 0.0 - 100.0 pg/mL    Comment: Performed at [facility], [address] [place], [place] [address]  CBC     Status: Abnormal   Collection Time: 07/18/22 11:15 AM  Result Value Ref Range   WBC 4.7 4.0 - 10.5 K/uL   RBC 5.41 4.22 - 5.81 MIL/uL   Hemoglobin 14.4 13.0 - 17.0 g/dL   HCT 29.5 62.1 - 30.8 %   MCV 86.9 80.0 - 100.0 fL   MCH 26.6 26.0 - 34.0 pg   MCHC 30.6 30.0 - 36.0 g/dL   RDW 65.7 84.6 - 96.2 %   Platelets 487 (H) 150 - 400 K/uL   nRBC 0.0 0.0 - 0.2 %    Comment: Performed at [facility], [address], [place], [place] [address]  Digoxin level     Status: None   Collection Time: 07/18/22 11:15 AM  Result Value Ref Range   Digoxin Level 1.0 0.8 - 2.0 ng/mL    Comment: Performed at [facility], [address] [place], [place] [address]  POCT glycosylated hemoglobin (Hb A1C)     Status: Abnormal   Collection Time: 08/14/22  3:53 PM  Result Value Ref Range   Hemoglobin A1C 6.3 (A) 4.0 - 5.6 %   HbA1c POC (<> result, manual entry)     HbA1c, POC (prediabetic range)     HbA1c, POC (controlled diabetic range)          Garner Nash, MD, MS

## 2022-08-14 NOTE — Assessment & Plan Note (Signed)
Well-controlled on Jardiance 10 mg  Plan: Continue Jardiance 10 mg Check fasting blood sugars weekly Follow-up in 3 months

## 2022-08-14 NOTE — Assessment & Plan Note (Signed)
Jerry Saunders has a history of heart failure and a recent blood clot managed with medication and monitored by the heart failure clinic.  Plan: Continue current medications as prescribed  Counseled on compliance Ensure timely refills to prevent missed doses on future vacations. Follow up with the heart failure clinic as scheduled.

## 2022-08-17 NOTE — Progress Notes (Incomplete)
***In Progress***    Advanced Heart Failure Clinic Note  Referring Physician: PCP: Pcp, No PCP-Cardiologist: Dr. Odis Hollingshead AHF: Dr. Gala Romney    HPI: Jerry Saunders is a 52 y.o. male with systolic HF due to [facility], DM2, obesity, PVCs, PE and noncompliance.    Diagnosed with HF in 06/2021. - Echo 06/2021: EF < 20% RV severely HK - Cath 06/2021 No CAD. RA 16 PA 67/43 (54) PCW 27 Fick 5.8/2.3 - cMRI EF 20% RV 27% Minimal LGE   Admitted 06/2022 with severeal weeks of HF symptoms after not taking meds for more than a month. Also reported syncopal episode several days prior to admission.  CT in ER showed right lower lobe proximal segmental pulmonary embolism, minimal clot burden with no evidence of right heart strain. He was tachycardic and visibly SOB requiring Bipap. Received t-PA per CCM.  Shortly after patient developed AFL at 150 bpm. Moved to ICU. Started on IV amio and low-dose NE. Ultimately required addition of milrinone for support. Echo showed biventricular dysfunction, LVEF 20% w/ large LV clot, RV severely down. He converted to NSR on amio gtt. He diuresed w/ IV Lasix and able to wean off milrinone and NE support. GDMT added. Anticoagulated w/ Eliquis. Discharged home on 07/07/22. D/c wt 267 lb.    At the last visit he reported feeling a lot better. Exercise tolerance improved. Able to do ADLs w/o significant dyspnea or limitation. Wt is much lower than d/c wt at 245 lb today ( ? accuracy of charted hospital wt). He has made significant dietary modifications and is eating less fast food and cutting out sodium. BP is also soft, 96/60. Took AM meds already. Denies orthostatic symptoms. EKG shows sinus tachycardia 106 bpm. Reports full med compliance. Denies abnormal bleeding w/ Eliquis. No chest pain.   Today he returns to HF clinic for pharmacist medication titration. At last visit with PA no medication changes were made.  Overall feeling ***. Dizziness, lightheadedness, fatigue:  Chest pain or  palpitations:  How is your breathing?: *** SOB: Able to complete all ADLs. Activity level ***  Weight at home pounds. Takes furosemide/torsemide/bumex *** mg *** daily.  LEE PND/Orthopnea  Appetite *** Low-salt diet:   Physical Exam Cost/affordability of meds   HF Medications: losartan 12.5 mg daily spironolactone 12.5 mg daily Jardiance 10 mg daily torsemide 20 mg daily  Has the patient been experiencing any side effects to the medications prescribed?  {YES NO:22349}  Does the patient have any problems obtaining medications due to transportation or finances?   {YES YQ:03474} [employer] commercial  Understanding of regimen: {excellent/good/fair/poor:19665} Understanding of indications: {excellent/good/fair/poor:19665} Potential of compliance: {excellent/good/fair/poor:19665} Patient understands to avoid NSAIDs. Patient understands to avoid decongestants.    Pertinent Lab Values: *update after visit* Serum creatinine ***, BUN ***, Potassium ***, Sodium ***, BNP ***, Magnesium ***, Digoxin ***   Vital Signs: Weight: *** (last clinic weight: 253) Blood pressure: ***  Heart rate: ***   Assessment/Plan: 1. 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 NYHA class II symptoms. - Echo 07/01/22: EF < 20% RV severely HK - Cath 07/01/22: No CAD. RA 16 PA 67/43 (54) PCW 27 Fick 5.8/2.3 - cMRI 07/01/22: EF 20% RV 27% Minimal LGE - recent ICU admission for cardiogenic shock 6/24 in setting of med noncompliance>>Echo 07/01/22: EF 20% RV severely down. Large LV clot. Moderate to severe MR. Moderate to severe TR. Required milrinone and NE support. Stabilized, weaned off inotropes/pressors and transitioned to oral GDMT  - today, NYHA  Class II. Volume ok on exam. May be a little dry based on wt loss, tachycardia and low BP. Does not look low output on exam    - check BMP and BNP today.  May need to reduce torsemide from daily to PRN  - Continue Jardiance 10 mg daily - Continue digoxin 0.125mg ,  Check Dig level  - Continue spiro 12.5 mg daily - Continue losartan 12.5 daily. BP too low for titration/transition to Entresto  - No beta blocker with recent low-output HF - Ideally needs transplant but non-compliance major issue. He has improved compliance since discharge. Encouraged to continue consistent use of meds + f/us    2. Pulmonary Embolism  - CT 06/30/22 - Right lower lobe proximal segmental pulmonary embolus. Minimal clot burden with no evidence of right heart strain.  - LE venous duplex negative  - s/p t-PA on 06/30/22 - Suspect PE is incidental due to severe RV stasis.  - Continue Eliquis 5 bid.    3. Atrial Flutter  - EKG today shows sinus tachy  - Continue po amiodarone 200 mg daily  - Continue Eliquis. Denies abnormal bleeding. Check CBC today    4. LV thrombus - in setting of severe LV dysfunction/AK  - continue anticoagulation as above   5. Valvular heart disease - Echo 07/02/22 with EF  < 20%, moderate to severe MR and moderate to severe TR - Suspect functional - Treatment of this is HF optimization per above    6. DM2 - Most recent A1c 6.7  - on  SGLT2i - management per PCP      7.  Non-compliance - doing better. Congratulated on efforts  - discussed importance of strict compliance to help w/ management of HF and improve candidacy for advanced therapies if later needed     Karle Plumber, PharmD, BCPS, BCCP, CPP Heart Failure Clinic Pharmacist [phone]

## 2022-08-20 ENCOUNTER — Ambulatory Visit (HOSPITAL_COMMUNITY)
Admission: RE | Admit: 2022-08-20 | Discharge: 2022-08-20 | Disposition: A | Discharge status: Home / Self Care | Payer: 59 | Source: Ambulatory Visit | Attending: Cardiology | Admitting: Cardiology

## 2022-08-20 ENCOUNTER — Other Ambulatory Visit (HOSPITAL_COMMUNITY): Payer: Self-pay

## 2022-08-20 VITALS — BP 102/82 | HR 88 | Wt 252.6 lb

## 2022-08-20 DIAGNOSIS — Z86711 Personal history of pulmonary embolism: Secondary | ICD-10-CM | POA: Insufficient documentation

## 2022-08-20 DIAGNOSIS — I513 Intracardiac thrombosis, not elsewhere classified: Secondary | ICD-10-CM | POA: Insufficient documentation

## 2022-08-20 DIAGNOSIS — E669 Obesity, unspecified: Secondary | ICD-10-CM | POA: Insufficient documentation

## 2022-08-20 DIAGNOSIS — Z91119 Patient's noncompliance with dietary regimen due to unspecified reason: Secondary | ICD-10-CM | POA: Insufficient documentation

## 2022-08-20 DIAGNOSIS — Z7901 Long term (current) use of anticoagulants: Secondary | ICD-10-CM | POA: Diagnosis not present

## 2022-08-20 DIAGNOSIS — I4892 Unspecified atrial flutter: Secondary | ICD-10-CM | POA: Insufficient documentation

## 2022-08-20 DIAGNOSIS — I5022 Chronic systolic (congestive) heart failure: Secondary | ICD-10-CM | POA: Insufficient documentation

## 2022-08-20 DIAGNOSIS — E119 Type 2 diabetes mellitus without complications: Secondary | ICD-10-CM | POA: Insufficient documentation

## 2022-08-20 DIAGNOSIS — I340 Nonrheumatic mitral (valve) insufficiency: Secondary | ICD-10-CM | POA: Insufficient documentation

## 2022-08-20 DIAGNOSIS — I361 Nonrheumatic tricuspid (valve) insufficiency: Secondary | ICD-10-CM | POA: Insufficient documentation

## 2022-08-20 DIAGNOSIS — I428 Other cardiomyopathies: Secondary | ICD-10-CM | POA: Diagnosis present

## 2022-08-20 DIAGNOSIS — I493 Ventricular premature depolarization: Secondary | ICD-10-CM | POA: Diagnosis not present

## 2022-08-20 LAB — BASIC METABOLIC PANEL
Anion gap: 11 (ref 5–15)
BUN: 13 mg/dL (ref 6–20)
CO2: 25 mmol/L (ref 22–32)
Calcium: 8.6 mg/dL — ABNORMAL LOW (ref 8.9–10.3)
Chloride: 99 mmol/L (ref 98–111)
Creatinine, Ser: 0.96 mg/dL (ref 0.61–1.24)
GFR, Estimated: >60 mL/min (ref >60)
Glucose, Bld: 116 mg/dL — ABNORMAL HIGH (ref 70–99)
Potassium: 3.6 mmol/L (ref 3.5–5.1)
Sodium: 135 mmol/L (ref 135–145)

## 2022-08-20 LAB — BRAIN NATRIURETIC PEPTIDE: B Natriuretic Peptide: 766.6 pg/mL — ABNORMAL HIGH (ref 0.0–100.0)

## 2022-08-20 MED ORDER — SPIRONOLACTONE 25 MG PO TABS
25.0000 mg | ORAL_TABLET | Freq: Every day | ORAL | 5 refills | Status: DC
Start: 2022-08-20 — End: 2023-03-25
  Filled 2022-08-20: qty 30, 30d supply, fill #0
  Filled 2022-09-28: qty 30, 30d supply, fill #1
  Filled 2022-11-02: qty 30, 30d supply, fill #2
  Filled 2022-11-20: qty 30, 30d supply, fill #3
  Filled 2022-12-25 – 2022-12-26 (×2): qty 30, 30d supply, fill #4
  Filled 2023-02-07 – 2023-02-28 (×2): qty 30, 30d supply, fill #5

## 2022-08-20 NOTE — Progress Notes (Signed)
Advanced Heart Failure Clinic Note  Referring Physician: PCP: Pcp, No PCP-Cardiologist: Dr. Odis Hollingshead AHF: Dr. Gala Romney    HPI: Jerry Saunders is a 52 y.o. male with systolic HF due to [facility], DM2, obesity, PVCs, PE and noncompliance.    Diagnosed with HF in 06/2021. - Echo 06/2021: EF < 20% RV severely HK - Cath 06/2021 No CAD. RA 16 PA 67/43 (54) PCW 27 Fick 5.8/2.3 - cMRI EF 20% RV 27% Minimal LGE   Admitted 06/2022 with severeal weeks of HF symptoms after not taking meds for more than a month. Also reported syncopal episode several days prior to admission.  CT in ER showed right lower lobe proximal segmental pulmonary embolism, minimal clot burden with no evidence of right heart strain. He was tachycardic and visibly SOB requiring Bipap. Received t-PA per CCM.  Shortly after, patient developed AFL at 150 bpm. Moved to ICU. Started on IV amio and low-dose NE. Ultimately required addition of milrinone for support. Echo showed biventricular dysfunction, LVEF 20% w/ large LV clot, RV severely down. He converted to NSR on amio gtt. He diuresed w/ IV Lasix and able to wean off milrinone and NE support. GDMT added. Anticoagulated w/ Eliquis. Discharged home on 07/07/22. D/c wt 267 lb.    At the last visit to AHF clinic on 07/18/22 he reported feeling a lot better. His exercise tolerance had improved and he was able to do ADLs w/o significant dyspnea or limitation. Wt was much lower than d/c wt at 245 lbs ( ? accuracy of charted hospital wt). He had made significant dietary modifications and was eating less fast food and cutting out sodium. BP was soft, 96/60.  Had taken AM meds already. Denied orthostatic symptoms. EKG showed sinus tachycardia 106 bpm. Reported full med compliance. Denied abnormal bleeding w/ Eliquis. No chest pain.   Today he returns to HF clinic for pharmacist medication titration. At last visit with PA no medication changes were made.Today he reports feeling well. He denies dizziness,  lightheadedness and fatigue. BP is still soft at 102/82 despite not taking meds this AM. No CP or palpitations. No SOB/DOE.  At home his normal weight range is 247-255 lbs. He takes torsemide 20 mg daily and has not needed any extra doses. No LEE. He denies orthopnea/PND but has been sleeping on 4 pillows. He has been trying to adhere to a low sodium diet.  He reports no difficulty affording his meds.  HF Medications: losartan 12.5 mg daily spironolactone 12.5 mg daily Jardiance 10 mg daily Digoxin 0.125 mg daily torsemide 20 mg daily  Has the patient been experiencing any side effects to the medications prescribed?  No  Does the patient have any problems obtaining medications due to transportation or finances?  No [employer] commercial. Receiving copay cards for Jardiance and Eliquis  Understanding of regimen: good Understanding of indications: good Potential of compliance: good Patient understands to avoid NSAIDs. Patient understands to avoid decongestants.    Pertinent Lab Values:  Labs from 07/18/22 Serum creatinine 1.13, BUN 17, Potassium 3.9, Sodium 141, BNP 963.1, Digoxin 1.0  BMET pending  Vital Signs: Weight: 252.6 lbs (last clinic weight: 253 lbs) Blood pressure: 102/82  Heart rate: 88   Assessment/Plan: 1. Chronic Systolic HF due to NICM  - Echo 07/01/22: EF < 20% RV severely HK - Cath 07/01/22: No CAD. RA 16 PA 67/43 (54) PCW 27 Fick 5.8/2.3 - cMRI 07/01/22: EF 20% RV 27% Minimal LGE - recent ICU admission for cardiogenic shock  06/2022 in setting of med noncompliance>>Echo 07/01/22: EF 20% RV severely down. Large LV clot. Moderate to severe MR. Moderate to severe TR. Required milrinone and NE support. Stabilized, weaned off inotropes/pressors and transitioned to oral GDMT  - NYHA II, euvolemic on exam.  - BMET BNP today pending - Continue torsemide 20 mg daily. Can consider decreasing pending labs.  - No beta blocker due to recent low-output HF -Continue losartan 12.5 mg  daily. BP too low to transition to [employer]. - Increase spironolactone to 25 mg daily. Repeat BMET next week.  - Continue Jardiance 10 mg daily - Continue digoxin 0.125 mg daily - Ideally needs transplant but non-compliance major issue. He has improved compliance since discharge. Encouraged to continue consistent use of meds + f/us    2. Pulmonary Embolism  - CT 06/30/22 - Right lower lobe proximal segmental pulmonary embolus. Minimal clot burden with no evidence of right heart strain.  - LE venous duplex negative  - s/p t-PA on 06/30/22 - Suspect PE is incidental due to severe RV stasis.  - Continue Eliquis 5 mg BID.    3. Atrial Flutter  - EKG on 07/18/22 showed sinus tachy  - Continue po amiodarone 200 mg daily  - Continue Eliquis. Denies abnormal bleeding.    4. LV thrombus - in setting of severe LV dysfunction/AK  - continue anticoagulation as above   5. Valvular heart disease - Echo 07/02/22 with EF  < 20%, moderate to severe MR and moderate to severe TR - Suspect functional - Treatment of this is HF optimization per above    6. DM2 - Most recent A1c 6.7  - on  SGLT2i - management per PCP      7.  Non-compliance - doing better. Congratulated on efforts  - discussed importance of strict compliance to help w/ management of HF and improve candidacy for advanced therapies if later needed   Follow up with NP on 08/29/22   Karle Plumber, PharmD, BCPS, BCCP, CPP Heart Failure Clinic Pharmacist [phone]

## 2022-08-20 NOTE — Patient Instructions (Signed)
It was a pleasure seeing you today!  MEDICATIONS: -We are changing your medications today -Increase spironolactone to 25 mg (1 tablet) daily -Call if you have questions about your medications.  LABS: -We will call you if your labs need attention.  NEXT APPOINTMENT: Return to clinic in 1 week with [facility].  In general, to take care of your heart failure: -Limit your fluid intake to 2 Liters (half-gallon) per day.   -Limit your salt intake to ideally 2-3 grams (2000-3000 mg) per day. -Weigh yourself daily and record, and bring that "weight diary" to your next appointment.  (Weight gain of 2-3 pounds in 1 day typically means fluid weight.) -The medications for your heart are to help your heart and help you live longer.   -Please contact us before stopping any of your heart medications.  Call the clinic at [phone] with questions or to reschedule future appointments.

## 2022-08-22 ENCOUNTER — Encounter (HOSPITAL_COMMUNITY): Payer: Self-pay

## 2022-08-29 ENCOUNTER — Encounter (HOSPITAL_COMMUNITY): Payer: 59

## 2022-09-04 ENCOUNTER — Other Ambulatory Visit (HOSPITAL_COMMUNITY): Payer: Self-pay

## 2022-09-12 ENCOUNTER — Telehealth (HOSPITAL_COMMUNITY): Payer: Self-pay

## 2022-09-12 NOTE — Telephone Encounter (Signed)
No response from pt.  Closed referral  

## 2022-09-14 ENCOUNTER — Other Ambulatory Visit (HOSPITAL_COMMUNITY): Payer: Self-pay

## 2022-09-17 ENCOUNTER — Other Ambulatory Visit (HOSPITAL_COMMUNITY): Payer: Self-pay

## 2022-10-01 ENCOUNTER — Other Ambulatory Visit (HOSPITAL_COMMUNITY): Payer: Self-pay

## 2022-10-14 ENCOUNTER — Other Ambulatory Visit (HOSPITAL_COMMUNITY): Payer: Self-pay

## 2022-10-15 ENCOUNTER — Other Ambulatory Visit: Payer: Self-pay

## 2022-10-15 ENCOUNTER — Other Ambulatory Visit (HOSPITAL_COMMUNITY): Payer: Self-pay

## 2022-10-25 ENCOUNTER — Other Ambulatory Visit (HOSPITAL_COMMUNITY): Payer: Self-pay

## 2022-10-26 ENCOUNTER — Other Ambulatory Visit (HOSPITAL_COMMUNITY): Payer: Self-pay

## 2022-10-28 ENCOUNTER — Other Ambulatory Visit (HOSPITAL_COMMUNITY): Payer: Self-pay

## 2022-10-29 ENCOUNTER — Other Ambulatory Visit (HOSPITAL_COMMUNITY): Payer: Self-pay

## 2022-10-30 ENCOUNTER — Encounter (HOSPITAL_COMMUNITY): Payer: 59

## 2022-11-02 ENCOUNTER — Other Ambulatory Visit (HOSPITAL_COMMUNITY): Payer: Self-pay

## 2022-11-02 ENCOUNTER — Encounter (HOSPITAL_COMMUNITY): Payer: Self-pay

## 2022-11-02 ENCOUNTER — Other Ambulatory Visit: Payer: Self-pay

## 2022-11-06 ENCOUNTER — Other Ambulatory Visit (HOSPITAL_COMMUNITY): Payer: Self-pay

## 2022-11-06 ENCOUNTER — Telehealth (HOSPITAL_COMMUNITY): Payer: Self-pay

## 2022-11-06 ENCOUNTER — Ambulatory Visit: Payer: MEDICAID | Attending: Cardiology | Admitting: Cardiology

## 2022-11-06 NOTE — Telephone Encounter (Signed)
Front office called patient at [phone] to reschedule this patients appointment from Friday, 11/16/2022.  Front office unable to leave patient a voicemail due to mailbox full.

## 2022-11-13 ENCOUNTER — Encounter: Payer: Self-pay | Admitting: Family Medicine

## 2022-11-13 ENCOUNTER — Ambulatory Visit (INDEPENDENT_AMBULATORY_CARE_PROVIDER_SITE_OTHER): Payer: Self-pay | Admitting: Family Medicine

## 2022-11-13 VITALS — BP 130/82 | HR 101 | Temp 97.9°F | Wt 266.2 lb

## 2022-11-13 DIAGNOSIS — Z6835 Body mass index (BMI) 35.0-35.9, adult: Secondary | ICD-10-CM

## 2022-11-13 DIAGNOSIS — I4892 Unspecified atrial flutter: Secondary | ICD-10-CM

## 2022-11-13 DIAGNOSIS — I5043 Acute on chronic combined systolic (congestive) and diastolic (congestive) heart failure: Secondary | ICD-10-CM

## 2022-11-13 DIAGNOSIS — E1165 Type 2 diabetes mellitus with hyperglycemia: Secondary | ICD-10-CM

## 2022-11-13 DIAGNOSIS — I272 Pulmonary hypertension, unspecified: Secondary | ICD-10-CM

## 2022-11-13 DIAGNOSIS — I82462 Acute embolism and thrombosis of left calf muscular vein: Secondary | ICD-10-CM

## 2022-11-13 DIAGNOSIS — E66812 Obesity, class 2: Secondary | ICD-10-CM

## 2022-11-13 LAB — POCT GLYCOSYLATED HEMOGLOBIN (HGB A1C): Hemoglobin A1C: 5.9 % — AB (ref 4.0–5.6)

## 2022-11-13 NOTE — Assessment & Plan Note (Signed)
Stable.  However patient currently without Jardiance.  Continue to monitor blood sugars and address after urgent cardiac evaluation.

## 2022-11-13 NOTE — Patient Instructions (Addendum)
-  Emergency department visit-: Head to the emergency department at [facility] on [facility] as soon as possible for IV diuretics and further evaluation.  [facility] [address] [place], [place] [address]

## 2022-11-13 NOTE — Progress Notes (Signed)
Assessment/Plan:   Problem List Items Addressed This Visit       Cardiovascular and Mediastinum   Acute on chronic combined systolic and diastolic heart failure (HCC)    History of heart failure presents with worsening dyspnea and weight gain in the setting of inability to afford Eliquis for anticoagulation and Jardiance   Plan: Immediate referral to the emergency department for IV diuresis and further cardiac evaluation. Offered EMS transport, however patient declined and stated that he would reach out to his wife to transport him      DVT (deep venous thrombosis) (HCC)   Severe pulmonary hypertension (HCC)   Atrial flutter, paroxysmal (HCC)     Endocrine   Type 2 diabetes mellitus (HCC) - Primary    Stable.  However patient currently without Jardiance.  Continue to monitor blood sugars and address after urgent cardiac evaluation.      Relevant Orders   POCT glycosylated hemoglobin (Hb A1C) (Completed)     Other   Class 2 severe obesity due to excess calories with serious comorbidity and body mass index (BMI) of 35.0 to 35.9 in adult [facility])    There are no discontinued medications.  Return if symptoms worsen or fail to improve.    Subjective:   Encounter date: 11/13/2022  Jerry Saunders is a 52 y.o. male who has Acute on chronic combined systolic and diastolic heart failure (HCC); Acute hypoxic respiratory failure (HCC); Elevated d-dimer; Type 2 diabetes mellitus (HCC); Normocytic anemia; DVT (deep venous thrombosis) (HCC); Acute combined systolic and diastolic congestive heart failure (HCC); Shock circulatory (HCC); Acute pulmonary embolism (HCC); Acute febrile illness; Syncope and collapse; Severe pulmonary hypertension (HCC); Elevated troponin I level; Ventricular ectopy; History of DVT (deep vein thrombosis); Morbid obesity (HCC); Noncompliance; Non-insulin dependent type 2 diabetes mellitus (HCC); Biventricular heart failure with reduced left ventricular function (HCC);  Acute decompensated heart failure (HCC); New onset atrial flutter (HCC); Left ventricular thrombosis; NSVT (nonsustained ventricular tachycardia) (HCC); Atrial flutter, paroxysmal (HCC); Cardiogenic shock (HCC); and Class 2 severe obesity due to excess calories with serious comorbidity and body mass index (BMI) of 35.0 to 35.9 in adult [facility]) on their problem list..   He  has a past medical history of Cardiomyopathy (HCC), CHF (congestive heart failure) (HCC), Diabetes mellitus without complication (HCC), DVT (deep venous thrombosis) (HCC), Hyperlipidemia, and Hypertension..   Chief Complaint: Weight gain, shortness of breath and diabetes management.  History of Present Illness:  Jerry Saunders significant cardiac history chronic heart failure, atrial flutter, severe pulmonary hypertension, pulmonary embolism as well as diabetes.  He has a history of multiple recent hospitalizations due to medication nonadherent.   He complains of progressive shortness of breath and weight gain of the past 2 to 3 weeks.  He reports that his shortness of breath is worse with physical exertion.  He endorses orthopnea.  He denies chest pain, paroxysmal nocturnal dyspnea, lower extremity swelling.   He reports inability to afford Eliquis and Jardiance medication due to cost leading to him running over the same timeframe that he started feeling worse.  He reports compliance on his other medications including amiodarone, digoxin, losartan, spironolactone, torsemide.  He reports that he has not had scheduled follow-up with cardiology since her last visit, but was unable to make the appointment due to a change in location of the office.  Diabetes: Jerry Saunders's diabetes is well-controlled. A1C was 5.9. He denies polyuria or polydipsia.    Past Surgical History:  Procedure Laterality Date   RIGHT/LEFT HEART CATH  AND CORONARY ANGIOGRAPHY N/A 06/20/2021   Procedure: RIGHT/LEFT HEART CATH AND CORONARY ANGIOGRAPHY;  Surgeon: Elder Negus, MD;  Location: [facility] INVASIVE CV LAB;  Service: Cardiovascular;  Laterality: N/A;    Outpatient Medications Prior to Visit  Medication Sig Dispense Refill   amiodarone (PACERONE) 200 MG tablet Take 1 tablet (200 mg total) by mouth daily. 30 tablet 3   apixaban (ELIQUIS) 5 MG TABS tablet Take 1 tablet (5 mg total) by mouth 2 (two) times daily. 60 tablet 4   digoxin (LANOXIN) 0.125 MG tablet Take 1 tablet (0.125 mg total) by mouth daily. 30 tablet 3   empagliflozin (JARDIANCE) 10 MG TABS tablet Take 1 tablet (10 mg total) by mouth daily. 30 tablet 5   losartan (COZAAR) 25 MG tablet Take 0.5 tablets (12.5 mg total) by mouth daily. 15 tablet 3   spironolactone (ALDACTONE) 25 MG tablet Take 1 tablet (25 mg total) by mouth daily. 30 tablet 5   torsemide (DEMADEX) 20 MG tablet Take 1 tablet (20 mg total) by mouth daily. 30 tablet 3   No facility-administered medications prior to visit.    No family history on file.  Social History   Socioeconomic History   Marital status: Married    Spouse name: Jerry Saunders   Number of children: 2   Years of education: Not on file   Highest education level: Not on file  Occupational History   Not on file  Tobacco Use   Smoking status: Never   Smokeless tobacco: Not on file  Vaping Use   Vaping status: Never Used  Substance and Sexual Activity   Alcohol use: No   Drug use: Never   Sexual activity: Not on file  Other Topics Concern   Not on file  Social History Narrative   1 step daughter   Social Determinants of Health   Financial Resource Strain: Not on file  Food Insecurity: No Food Insecurity (07/02/2022)   Hunger Vital Sign    Worried About Running Out of Food in the Last Year: Never true    Ran Out of Food in the Last Year: Never true  Transportation Needs: No Transportation Needs (07/02/2022)   PRAPARE - Administrator, Civil Service (Medical): No    Lack of Transportation (Non-Medical): No  Physical Activity: Not on  file  Stress: Not on file  Social Connections: Not on file  Intimate Partner Violence: Not on file                                                                                                  Objective:  Physical Exam: BP 130/82 (BP Location: Left Arm, Patient Position: Sitting, Cuff Size: Large)   Pulse (!) 101   Temp 97.9 F (36.6 C) (Temporal)   Wt 266 lb 3.2 oz (120.7 kg)   SpO2 98%   BMI 35.36 kg/m   Wt Readings from Last 3 Encounters:  11/13/22 266 lb 3.2 oz (120.7 kg)  08/20/22 252 lb 9.6 oz (114.6 kg)  08/14/22 253 lb (114.8 kg)    Physical Exam Constitutional:  Appearance: Normal appearance.  HENT:     Head: Normocephalic and atraumatic.     Right Ear: Hearing normal.     Left Ear: Hearing normal.     Nose: Nose normal.  Eyes:     General: No scleral icterus.       Right eye: No discharge.        Left eye: No discharge.     Extraocular Movements: Extraocular movements intact.  Cardiovascular:     Rate and Rhythm: Tachycardia present. Rhythm irregular.     Heart sounds: Murmur heard.  Pulmonary:     Effort: Pulmonary effort is normal.     Breath sounds: Normal breath sounds.     Comments: Increase work of breathing Abdominal:     Palpations: Abdomen is soft.     Tenderness: There is no abdominal tenderness.  Musculoskeletal:     Right lower leg: 1+ Pitting Edema present.     Left lower leg: 1+ Pitting Edema present.  Skin:    General: Skin is warm.     Findings: No rash.  Neurological:     General: No focal deficit present.     Mental Status: He is alert.     Cranial Nerves: No cranial nerve deficit.  Psychiatric:        Mood and Affect: Mood normal.        Behavior: Behavior normal.        Thought Content: Thought content normal.        Judgment: Judgment normal.     No results found.  Recent Results (from the past 2160 hour(s))  Basic Metabolic Panel (BMET)     Status: Abnormal   Collection Time: 08/20/22  2:20 PM  Result Value  Ref Range   Sodium 135 135 - 145 mmol/L   Potassium 3.6 3.5 - 5.1 mmol/L   Chloride 99 98 - 111 mmol/L   CO2 25 22 - 32 mmol/L   Glucose, Bld 116 (H) 70 - 99 mg/dL    Comment: Glucose reference range applies only to samples taken after fasting for at least 8 hours.   BUN 13 6 - 20 mg/dL   Creatinine, Ser 1.61 0.61 - 1.24 mg/dL   Calcium 8.6 (L) 8.9 - 10.3 mg/dL   GFR, Estimated >09 >60 mL/min    Comment: (NOTE) Calculated using the CKD-EPI Creatinine Equation (2021)    Anion gap 11 5 - 15    Comment: Performed at [facility], [address] [place], [place] [address]  B Nat Peptide     Status: Abnormal   Collection Time: 08/20/22  2:20 PM  Result Value Ref Range   B Natriuretic Peptide 766.6 (H) 0.0 - 100.0 pg/mL    Comment: Performed at [facility], [address] [place], [place] [address]  POCT glycosylated hemoglobin (Hb A1C)     Status: Abnormal   Collection Time: 11/13/22  3:49 PM  Result Value Ref Range   Hemoglobin A1C 5.9 (A) 4.0 - 5.6 %   HbA1c POC (<> result, manual entry)     HbA1c, POC (prediabetic range)     HbA1c, POC (controlled diabetic range)          Garner Nash, MD, MS

## 2022-11-13 NOTE — Assessment & Plan Note (Signed)
History of heart failure presents with worsening dyspnea and weight gain in the setting of inability to afford Eliquis for anticoagulation and Jardiance   Plan: Immediate referral to the emergency department for IV diuresis and further cardiac evaluation. Offered EMS transport, however patient declined and stated that he would reach out to his wife to transport him

## 2022-11-14 ENCOUNTER — Telehealth (HOSPITAL_COMMUNITY): Payer: Self-pay

## 2022-11-14 ENCOUNTER — Other Ambulatory Visit (HOSPITAL_COMMUNITY): Payer: Self-pay

## 2022-11-14 NOTE — Telephone Encounter (Signed)
Advanced Heart Failure Patient Advocate Encounter  Received notification that patient has concerns regarding cost of Jardiance. Attempts to contact patient unsuccessful as voice mail box is not set up. Will reach out to patient again to offer samples and assess eligibility for patient assistance program through [facility].  Burnell Blanks, CPhT Rx Patient Advocate Phone: [phone]

## 2022-11-16 ENCOUNTER — Encounter (HOSPITAL_COMMUNITY): Payer: Self-pay

## 2022-11-19 ENCOUNTER — Encounter (HOSPITAL_COMMUNITY): Payer: Self-pay | Admitting: Internal Medicine

## 2022-11-19 ENCOUNTER — Encounter: Payer: Self-pay | Admitting: Cardiology

## 2022-11-19 NOTE — Telephone Encounter (Signed)
Left voicemail, sending mychart message to patient. Information needed in order to submit Jardiance application to [facility].  Medication Samples have been left at registration desk for patient pick up. Drug name: Jardiance 10 MG Qty: 3x 7 ct packages LOT: [id number] Exp.: 11/2023 SIG: Take 1 tablet by mouth once daily   The patient has been instructed regarding the correct time, dose, and frequency of taking this medication, including desired effects and most common side effects.

## 2022-11-19 NOTE — Telephone Encounter (Signed)
Attempted to contact patient regarding eligibility for [facility] assistance. No answer.

## 2022-11-20 ENCOUNTER — Other Ambulatory Visit (HOSPITAL_COMMUNITY): Payer: Self-pay | Admitting: Cardiology

## 2022-11-20 ENCOUNTER — Other Ambulatory Visit (HOSPITAL_COMMUNITY): Payer: Self-pay | Admitting: Internal Medicine

## 2022-11-20 NOTE — Telephone Encounter (Signed)
Spoke to patient by phone, he will stop by to pick up samples and will inquire about the new insurance plan that will be starting for him. Pt has an appointment scheduled for 11/21 and will inform the office if he would like to apply for [facility].

## 2022-11-21 ENCOUNTER — Other Ambulatory Visit: Payer: Self-pay

## 2022-11-21 ENCOUNTER — Other Ambulatory Visit (HOSPITAL_COMMUNITY): Payer: Self-pay

## 2022-11-21 MED ORDER — TORSEMIDE 20 MG PO TABS
20.0000 mg | ORAL_TABLET | Freq: Every day | ORAL | 3 refills | Status: DC
  Filled 2022-11-21: qty 30, 30d supply, fill #0

## 2022-11-21 MED ORDER — DIGOXIN 125 MCG PO TABS
0.1250 mg | ORAL_TABLET | Freq: Every day | ORAL | 3 refills | Status: DC
Start: 2022-11-21 — End: 2023-03-25
  Filled 2022-11-21: qty 30, 30d supply, fill #0
  Filled 2022-12-25 – 2022-12-26 (×2): qty 30, 30d supply, fill #1
  Filled 2023-01-24: qty 30, 30d supply, fill #2
  Filled 2023-02-17 – 2023-02-28 (×2): qty 30, 30d supply, fill #3

## 2022-11-21 MED ORDER — AMIODARONE HCL 200 MG PO TABS
200.0000 mg | ORAL_TABLET | Freq: Every day | ORAL | 3 refills | Status: DC
  Filled 2022-11-21: qty 30, 30d supply, fill #0
  Filled 2022-12-25 – 2022-12-26 (×2): qty 30, 30d supply, fill #1
  Filled 2023-01-24: qty 30, 30d supply, fill #2
  Filled 2023-02-17 – 2023-02-28 (×2): qty 30, 30d supply, fill #3

## 2022-11-21 MED ORDER — LOSARTAN POTASSIUM 25 MG PO TABS
12.5000 mg | ORAL_TABLET | Freq: Every day | ORAL | 3 refills | Status: DC
  Filled 2022-11-21: qty 15, 30d supply, fill #0

## 2022-11-21 NOTE — Telephone Encounter (Signed)
Sending to [facility], as she has been reaching out to the patient.

## 2022-11-26 ENCOUNTER — Other Ambulatory Visit (HOSPITAL_COMMUNITY): Payer: Self-pay

## 2022-11-29 ENCOUNTER — Encounter (HOSPITAL_COMMUNITY): Payer: Self-pay

## 2022-11-29 ENCOUNTER — Other Ambulatory Visit (HOSPITAL_COMMUNITY): Payer: Self-pay

## 2022-11-29 ENCOUNTER — Ambulatory Visit (HOSPITAL_COMMUNITY)
Admission: RE | Admit: 2022-11-29 | Discharge: 2022-11-29 | Disposition: A | Discharge status: Home / Self Care | Payer: Medicaid Other | Source: Ambulatory Visit | Attending: Cardiology | Admitting: Cardiology

## 2022-11-29 VITALS — BP 128/74 | HR 91 | Wt 266.0 lb

## 2022-11-29 DIAGNOSIS — I081 Rheumatic disorders of both mitral and tricuspid valves: Secondary | ICD-10-CM | POA: Insufficient documentation

## 2022-11-29 DIAGNOSIS — I2699 Other pulmonary embolism without acute cor pulmonale: Secondary | ICD-10-CM | POA: Insufficient documentation

## 2022-11-29 DIAGNOSIS — Z91199 Patient's noncompliance with other medical treatment and regimen due to unspecified reason: Secondary | ICD-10-CM | POA: Insufficient documentation

## 2022-11-29 DIAGNOSIS — R55 Syncope and collapse: Secondary | ICD-10-CM | POA: Insufficient documentation

## 2022-11-29 DIAGNOSIS — R000 Tachycardia, unspecified: Secondary | ICD-10-CM | POA: Insufficient documentation

## 2022-11-29 DIAGNOSIS — Z7901 Long term (current) use of anticoagulants: Secondary | ICD-10-CM | POA: Insufficient documentation

## 2022-11-29 DIAGNOSIS — E119 Type 2 diabetes mellitus without complications: Secondary | ICD-10-CM | POA: Insufficient documentation

## 2022-11-29 DIAGNOSIS — I428 Other cardiomyopathies: Secondary | ICD-10-CM | POA: Insufficient documentation

## 2022-11-29 DIAGNOSIS — I4892 Unspecified atrial flutter: Secondary | ICD-10-CM | POA: Insufficient documentation

## 2022-11-29 DIAGNOSIS — I5022 Chronic systolic (congestive) heart failure: Secondary | ICD-10-CM

## 2022-11-29 DIAGNOSIS — I110 Hypertensive heart disease with heart failure: Secondary | ICD-10-CM | POA: Insufficient documentation

## 2022-11-29 DIAGNOSIS — Z7984 Long term (current) use of oral hypoglycemic drugs: Secondary | ICD-10-CM | POA: Insufficient documentation

## 2022-11-29 DIAGNOSIS — I5043 Acute on chronic combined systolic (congestive) and diastolic (congestive) heart failure: Secondary | ICD-10-CM

## 2022-11-29 DIAGNOSIS — Z79899 Other long term (current) drug therapy: Secondary | ICD-10-CM | POA: Insufficient documentation

## 2022-11-29 LAB — BRAIN NATRIURETIC PEPTIDE: B Natriuretic Peptide: 1077.5 pg/mL — ABNORMAL HIGH (ref 0.0–100.0)

## 2022-11-29 LAB — BASIC METABOLIC PANEL
Anion gap: 9 (ref 5–15)
BUN: 15 mg/dL (ref 6–20)
CO2: 28 mmol/L (ref 22–32)
Calcium: 8.7 mg/dL — ABNORMAL LOW (ref 8.9–10.3)
Chloride: 103 mmol/L (ref 98–111)
Creatinine, Ser: 1.35 mg/dL — ABNORMAL HIGH (ref 0.61–1.24)
GFR, Estimated: >60 mL/min (ref >60)
Glucose, Bld: 106 mg/dL — ABNORMAL HIGH (ref 70–99)
Potassium: 3.9 mmol/L (ref 3.5–5.1)
Sodium: 140 mmol/L (ref 135–145)

## 2022-11-29 LAB — DIGOXIN LEVEL: Digoxin Level: 0.7 ng/mL — ABNORMAL LOW (ref 0.8–2.0)

## 2022-11-29 MED ORDER — ENTRESTO 24-26 MG PO TABS
1.0000 | ORAL_TABLET | Freq: Two times a day (BID) | ORAL | 6 refills | Status: DC
  Filled 2022-11-29 (×2): qty 60, 30d supply, fill #0
  Filled 2022-12-25 – 2023-02-28 (×7): qty 60, 30d supply, fill #1
  Filled 2023-04-17: qty 60, 30d supply, fill #2
  Filled 2023-06-10 – 2023-07-22 (×3): qty 60, 30d supply, fill #3
  Filled 2023-09-25 – 2023-10-23 (×2): qty 60, 30d supply, fill #4

## 2022-11-29 MED ORDER — TORSEMIDE 20 MG PO TABS
40.0000 mg | ORAL_TABLET | Freq: Every day | ORAL | 3 refills | Status: DC
  Filled 2022-11-29 – 2022-12-25 (×2): qty 180, 90d supply, fill #0
  Filled 2023-01-10 (×2): qty 60, 30d supply, fill #0

## 2022-11-29 NOTE — Progress Notes (Signed)
ReDS Vest / Clip - 11/29/22 1500       ReDS Vest / Clip   Station Marker C    Ruler Value 28    ReDS Value Range High volume overload    ReDS Actual Value 42

## 2022-11-29 NOTE — Progress Notes (Signed)
[facility] Failure Clinic Note   Referring Physician: PCP: Garnette Gunner, MD PCP-Cardiologist: Dr. Odis Hollingshead AHF: Dr. Gala Romney    HPI: Jerry Saunders is a 52 y.o. male with systolic HF due to [facility], DM2, obesity, PVCs, PE and noncompliance.    Diagnosed with HF in 6/23. - Echo 6/23: EF < 20% RV severely HK - Cath 6/23 No CAD. RA 16 PA 67/43 (54) PCW 27 Fick 5.8/2.3 - cMRI EF 20% RV 27% Minimal LGE   Admitted 6/24 with severeal weeks of HF symptoms after not taking meds for more than a month. Also reported syncopal episode several days prior to admission.  CT in ER showed right lower lobe proximal segmental pulmonary embolism, minimal clot burden with no evidence of right heart strain. He was tachycardic and visibly SOB requiring Bipap. Received t-PA per CCM.  Shortly after patient developed AFL at 150 bpm. Moved to ICU. Started on IV amio and low-dose NE. Ultimately required addition of milrinone for support. Echo showed biventricular dysfunction, LVEF 20% w/ large LV clot, RV severely down. He converted to NSR on amio gtt. He diuresed w/ IV Lasix and able to wean off milrinone and NE support. GDMT added. Anticoagulated w/ Eliquis. Discharged home on 6/29. D/c wt 267 lb.   He presents today for routine f/u. He feels more SOB over recent wks. Also reports LEE. ReDs is elevated at 42%. Reports full med compliance except he has only been taking eliquis once daily instead of twice daily. Claims he thought he was only supposed to take once daily. Denies CP. No palpitations. BP 128/73. EKG shows NSR 97 bpm.      2D Echo 6/24  1. Left ventricular thrombus 2.7 x 1.6cm (largest dimension). Left  ventricular ejection fraction, by estimation, is <20%. The left ventricle  has severely decreased function. The left ventricle demonstrates global  hypokinesis. The left ventricular  internal cavity size was severely dilated. Left ventricular diastolic  parameters are consistent with Grade III  diastolic dysfunction  (restrictive). There is the interventricular septum is flattened in  systole and diastole, consistent with right  ventricular pressure and volume overload.   2. Degree of pulmonary hypertension is underestimated due to dialted RA  and RV size. Right ventricular systolic function is severely reduced. The  right ventricular size is severely enlarged. There is moderately elevated  pulmonary artery systolic  pressure. The estimated right ventricular systolic pressure is 49.3 mmHg.   3. Left atrial size was severely dilated.   4. Right atrial size was severely dilated.   5. The mitral valve is degenerative. Moderate to severe mitral valve  regurgitation. No evidence of mitral stenosis.   6. Tricuspid valve regurgitation is moderate to severe.   7. The aortic valve is tricuspid. Aortic valve regurgitation is not  visualized. No aortic stenosis is present.   8. The inferior vena cava is dilated in size with <50% respiratory  variability, suggesting right atrial pressure of 15 mmHg.       Review of Systems: [y] = yes, [ ]  = no   General: Weight gain [ ] ; Weight loss [ ] ; Anorexia [ ] ; Fatigue [ ] ; Fever [ ] ; Chills [ ] ; Weakness [ ]   Cardiac: Chest pain/pressure [ ] ; Resting SOB [ ] ; Exertional SOB [Y ]; Orthopnea [ ] ; Pedal Edema [ Y]; Palpitations [ ] ; Syncope [ ] ; Presyncope [ ] ; Paroxysmal nocturnal dyspnea[ ]   Pulmonary: Cough [ ] ; Wheezing[ ] ; Hemoptysis[ ] ; Sputum [ ] ; Snoring [ ]   GI: Vomiting[ ] ; Dysphagia[ ] ; Melena[ ] ; Hematochezia [ ] ; Heartburn[ ] ; Abdominal pain [ ] ; Constipation [ ] ; Diarrhea [ ] ; BRBPR [ ]   GU: Hematuria[ ] ; Dysuria [ ] ; Nocturia[ ]   Vascular: Pain in legs with walking [ ] ; Pain in feet with lying flat [ ] ; Non-healing sores [ ] ; Stroke [ ] ; TIA [ ] ; Slurred speech [ ] ;  Neuro: Headaches[ ] ; Vertigo[ ] ; Seizures[ ] ; Paresthesias[ ] ;Blurred vision [ ] ; Diplopia [ ] ; Vision changes [ ]   Ortho/Skin: Arthritis [ ] ; Joint pain [ ] ; Muscle  pain [ ] ; Joint swelling [ ] ; Back Pain [ ] ; Rash [ ]   Psych: Depression[ ] ; Anxiety[ ]   Heme: Bleeding problems [ ] ; Clotting disorders [ ] ; Anemia [ ]   Endocrine: Diabetes [ ] ; Thyroid dysfunction[ ]    Past Medical History:  Diagnosis Date   Cardiomyopathy (HCC)    CHF (congestive heart failure) (HCC)    Diabetes mellitus without complication (HCC)    DVT (deep venous thrombosis) (HCC)    Hyperlipidemia    Hypertension     Current Outpatient Medications  Medication Sig Dispense Refill   amiodarone (PACERONE) 200 MG tablet Take 1 tablet (200 mg total) by mouth daily. 30 tablet 3   apixaban (ELIQUIS) 5 MG TABS tablet Take 1 tablet (5 mg total) by mouth 2 (two) times daily. (Patient taking differently: Take 5 mg by mouth daily.) 60 tablet 4   digoxin (LANOXIN) 0.125 MG tablet Take 1 tablet (0.125 mg total) by mouth daily. 30 tablet 3   empagliflozin (JARDIANCE) 10 MG TABS tablet Take 1 tablet (10 mg total) by mouth daily. 30 tablet 5   losartan (COZAAR) 25 MG tablet Take 1/2 tablet (12.5 mg total) by mouth daily. 15 tablet 3   spironolactone (ALDACTONE) 25 MG tablet Take 1 tablet (25 mg total) by mouth daily. (Patient taking differently: Take 12.5 mg by mouth daily.) 30 tablet 5   torsemide (DEMADEX) 20 MG tablet Take 1 tablet (20 mg total) by mouth daily. 30 tablet 3   No current facility-administered medications for this encounter.    No Known Allergies    Social History   Socioeconomic History   Marital status: Married    Spouse name: [name]   Number of children: 2   Years of education: Not on file   Highest education level: Not on file  Occupational History   Not on file  Tobacco Use   Smoking status: Never   Smokeless tobacco: Not on file  Vaping Use   Vaping status: Never Used  Substance and Sexual Activity   Alcohol use: No   Drug use: Never   Sexual activity: Not on file  Other Topics Concern   Not on file  Social History Narrative   1 step daughter    Social Determinants of Health   Financial Resource Strain: Not on file  Food Insecurity: No Food Insecurity (07/02/2022)   Hunger Vital Sign    Worried About Running Out of Food in the Last Year: Never true    Ran Out of Food in the Last Year: Never true  Transportation Needs: No Transportation Needs (07/02/2022)   PRAPARE - Administrator, Civil Service (Medical): No    Lack of Transportation (Non-Medical): No  Physical Activity: Not on file  Stress: Not on file  Social Connections: Not on file  Intimate Partner Violence: Not on file     No family history on file.  Vitals:   11/29/22 1449  BP: 128/74  Pulse: 91  SpO2: 98%  Weight: 120.7 kg (266 lb)      PHYSICAL EXAM: ReDs 42%  General:  Well appearing. No respiratory difficulty HEENT: normal Neck: supple. JVD 8 cm. Carotids 2+ bilat; no bruits. No lymphadenopathy or thyromegaly appreciated. Cor: PMI nondisplaced. Regular rate & rhythm. No rubs, gallops or murmurs. Lungs: clear Abdomen: soft, nontender, nondistended. No hepatosplenomegaly. No bruits or masses. Good bowel sounds. Extremities: no cyanosis, clubbing, rash, 1+ b/LE edema Neuro: alert & oriented x 3, cranial nerves grossly intact. moves all 4 extremities w/o difficulty. Affect pleasant.  ECG: NSR 97 bpm    ASSESSMENT & PLAN:  1. Chronic Systolic HF due to NICM - Echo 6/23: EF < 20% RV severely HK - Cath 6/23: No CAD. RA 16 PA 67/43 (54) PCW 27 Fick 5.8/2.3 - cMRI 06/23: EF 20% RV 27% Minimal LGE - recent ICU admission for cardiogenic shock 6/24 in setting of med noncompliance>>Echo 07/01/22: EF 20% RV severely down. Large LV clot. Moderate to severe MR. Moderate to severe TR. Required milrinone and NE support. Stabilized, weaned off inotropes/pressors and transitioned to oral GDMT  - today, NYHA Class II-early III. Volume overloaded on exam. ReDs 42%   - increase torsemide to 40 mg daily  - Continue Jardiance 10 mg daily - Stop Losartan.  Start Entresto 24-26 mg bid  - Continue Spironolactone 25 mg daily  - Continue digoxin 0.125mg , Check Dig level  - No beta blocker with recent low-output HF - Ideally needs transplant but previous non-compliance major issue. He has improved compliance since discharge. Encouraged to continue consistent use of meds + f/us   2. Pulmonary Embolism  - CT 06/30/22 - Right lower lobe proximal segmental pulmonary embolus. Minimal clot burden with no evidence of right heart strain.  - LE venous duplex negative  - s/p t-PA on 06/30/22 - Suspect PE is incidental due to severe RV stasis.  - Continue Eliquis. Educated on Eliquis and need for bid dosing. Increase to 5 mg bid   3. Atrial Flutter  - EKG today shows NSR  - Continue po amiodarone 200 mg daily. Check TSH/HFTs next visit   - Continue Eliquis. Denies abnormal bleeding.    4. LV thrombus - in setting of severe LV dysfunction/AK  - continue anticoagulation as above   5. Valvular heart disease - Echo 6/24 with EF  < 20%, moderate to severe MR and moderate to severe TR - Suspect functional - Treatment of this is HF optimization per above    6. DM2 - Most recent A1c 6.7  - on  SGLT2i - management per PCP     7.  Non-compliance - doing better w/ meds, with the exception of Eliquis as noted above. Concerned about the cost of meds. His new insurance will start 01/09/23. Samples supplied for Eliquis and Jardiance today. Given 30 day new Entresto card for free month supply. Pharmacy team helping to look at pt assistance options for future coverage  - discussed importance of strict compliance to help w/ management of HF and improve candidacy for advanced therapies if later needed   F/u BMP in 1 wk and w/ APP in 3-4 wks to reassess volume status.    Robbie Lis, PA-C 11/29/22

## 2022-11-29 NOTE — Addendum Note (Signed)
Encounter addended by: Faythe Casa, CMA on: 11/29/2022 3:56 PM  Actions taken: Clinical Note Signed

## 2022-11-29 NOTE — Progress Notes (Signed)
Medication Samples have been provided to the patient.  Drug name: Eliquis       Strength: 5 mg        Qty: 56  LOT: [id number]  Exp.Date: Jan 2026  Dosing instructions: Patient takes 1 tablet by mouth twice a day  The patient has been instructed regarding the correct time, dose, and frequency of taking this medication, including desired effects and most common side effects.   Jerry Saunders 3:51 PM 11/29/2022    Medication Samples have been provided to the patient.  Drug name: Jardiance       Strength: 5 mg        Qty: 28  LOT: [id number]  Exp.Date: 11/2024  Dosing instructions: Patient takes 1 tablet by mouth daily.  The patient has been instructed regarding the correct time, dose, and frequency of taking this medication, including desired effects and most common side effects.   Jerry Saunders 3:51 PM 11/29/2022

## 2022-11-29 NOTE — Telephone Encounter (Signed)
Pt confirmed at appointment that his new insurance plan will go into effect on 01/09/2023.   Sherryll Burger was started today, free trial card was provided. Pt has already used trial cards for Eliquis, Jardiance. Samples were provided x1 month, and pt was advised to call me or AHF when he runs low so that we can ensure the pt stays compliant with meds into the new year.

## 2022-11-29 NOTE — Patient Instructions (Signed)
Start Entresto 24/26 mg twice daily. Free 30 day coupon provided. Increase Torsemide to 40 mg daily - Rx updated to your local pharmacy. Stop Losartan. Continue Jardiance 10 mg daily - 30 day samples provided. Take your Eliquix 5 mg TWICE DAILY - 30 day samples provided. Repeat labs in one week - see below. Return to Heart Failure APP Clinic in 3 - 4 weeks. See below. Please call [phone] if any questions or concerns prior to next visit.

## 2022-12-17 ENCOUNTER — Ambulatory Visit (HOSPITAL_COMMUNITY)
Admission: RE | Admit: 2022-12-17 | Discharge: 2022-12-17 | Disposition: A | Discharge status: Home / Self Care | Payer: MEDICAID | Source: Ambulatory Visit | Attending: Cardiology | Admitting: Cardiology

## 2022-12-17 DIAGNOSIS — I5043 Acute on chronic combined systolic (congestive) and diastolic (congestive) heart failure: Secondary | ICD-10-CM | POA: Insufficient documentation

## 2022-12-17 LAB — BASIC METABOLIC PANEL
Anion gap: 8 (ref 5–15)
BUN: 17 mg/dL (ref 6–20)
CO2: 29 mmol/L (ref 22–32)
Calcium: 8.7 mg/dL — ABNORMAL LOW (ref 8.9–10.3)
Chloride: 103 mmol/L (ref 98–111)
Creatinine, Ser: 1.32 mg/dL — ABNORMAL HIGH (ref 0.61–1.24)
GFR, Estimated: >60 mL/min (ref >60)
Glucose, Bld: 167 mg/dL — ABNORMAL HIGH (ref 70–99)
Potassium: 3.9 mmol/L (ref 3.5–5.1)
Sodium: 140 mmol/L (ref 135–145)

## 2022-12-25 ENCOUNTER — Other Ambulatory Visit: Payer: Self-pay

## 2022-12-25 ENCOUNTER — Other Ambulatory Visit (HOSPITAL_COMMUNITY): Payer: Self-pay

## 2022-12-26 ENCOUNTER — Other Ambulatory Visit (HOSPITAL_COMMUNITY): Payer: Self-pay

## 2022-12-26 ENCOUNTER — Other Ambulatory Visit: Payer: Self-pay

## 2023-01-04 NOTE — Progress Notes (Signed)
Advanced Heart Failure Clinic Note   Referring Physician: PCP: Garnette Gunner, MD PCP-Cardiologist: Dr. Odis Hollingshead AHF: Dr. Gala Romney    HPI: Jerry Saunders is a 52 y.o. male with systolic HF due to [facility], DM2, obesity, PVCs, PE and noncompliance.    Diagnosed with HF in 6/23. - Echo 6/23: EF < 20% RV severely HK - Cath 6/23 No CAD. RA 16 PA 67/43 (54) PCW 27 Fick 5.8/2.3 - cMRI EF 20% RV 27% Minimal LGE   Admitted 6/24 with severeal weeks of HF symptoms after not taking meds for more than a month. Also reported syncopal episode several days prior to admission.  CT in ER showed right lower lobe proximal segmental pulmonary embolism, minimal clot burden with no evidence of right heart strain. He was tachycardic and visibly SOB requiring Bipap. Received t-PA per CCM.  Shortly after patient developed AFL at 150 bpm. Moved to ICU. Started on IV amio and low-dose NE. Ultimately required addition of milrinone for support. Echo showed biventricular dysfunction, LVEF 20% w/ large LV clot, RV severely down. He converted to NSR on amio gtt. He diuresed w/ IV Lasix and able to wean off milrinone and NE support. GDMT added. Anticoagulated w/ Eliquis. Discharged home on 6/29. D/c wt 267 lb.   He presents today for routine f/u. He feels more SOB over recent wks. Also reports LEE. ReDs is elevated at 42%. Reports full med compliance except he has only been taking eliquis once daily instead of twice daily. Claims he thought he was only supposed to take once daily. Denies CP. No palpitations. BP 128/73. EKG shows NSR 97 bpm.      2D Echo 6/24  1. Left ventricular thrombus 2.7 x 1.6cm (largest dimension). Left  ventricular ejection fraction, by estimation, is <20%. The left ventricle  has severely decreased function. The left ventricle demonstrates global  hypokinesis. The left ventricular  internal cavity size was severely dilated. Left ventricular diastolic  parameters are consistent with Grade III  diastolic dysfunction  (restrictive). There is the interventricular septum is flattened in  systole and diastole, consistent with right  ventricular pressure and volume overload.   2. Degree of pulmonary hypertension is underestimated due to dialted RA  and RV size. Right ventricular systolic function is severely reduced. The  right ventricular size is severely enlarged. There is moderately elevated  pulmonary artery systolic  pressure. The estimated right ventricular systolic pressure is 49.3 mmHg.   3. Left atrial size was severely dilated.   4. Right atrial size was severely dilated.   5. The mitral valve is degenerative. Moderate to severe mitral valve  regurgitation. No evidence of mitral stenosis.   6. Tricuspid valve regurgitation is moderate to severe.   7. The aortic valve is tricuspid. Aortic valve regurgitation is not  visualized. No aortic stenosis is present.   8. The inferior vena cava is dilated in size with <50% respiratory  variability, suggesting right atrial pressure of 15 mmHg.       Review of Systems: [y] = yes, [ ]  = no   General: Weight gain [ ] ; Weight loss [ ] ; Anorexia [ ] ; Fatigue [ ] ; Fever [ ] ; Chills [ ] ; Weakness [ ]   Cardiac: Chest pain/pressure [ ] ; Resting SOB [ ] ; Exertional SOB [Y ]; Orthopnea [ ] ; Pedal Edema [ Y]; Palpitations [ ] ; Syncope [ ] ; Presyncope [ ] ; Paroxysmal nocturnal dyspnea[ ]   Pulmonary: Cough [ ] ; Wheezing[ ] ; Hemoptysis[ ] ; Sputum [ ] ; Snoring [ ]   GI: Vomiting[ ] ; Dysphagia[ ] ; Melena[ ] ; Hematochezia [ ] ; Heartburn[ ] ; Abdominal pain [ ] ; Constipation [ ] ; Diarrhea [ ] ; BRBPR [ ]   GU: Hematuria[ ] ; Dysuria [ ] ; Nocturia[ ]   Vascular: Pain in legs with walking [ ] ; Pain in feet with lying flat [ ] ; Non-healing sores [ ] ; Stroke [ ] ; TIA [ ] ; Slurred speech [ ] ;  Neuro: Headaches[ ] ; Vertigo[ ] ; Seizures[ ] ; Paresthesias[ ] ;Blurred vision [ ] ; Diplopia [ ] ; Vision changes [ ]   Ortho/Skin: Arthritis [ ] ; Joint pain [ ] ; Muscle  pain [ ] ; Joint swelling [ ] ; Back Pain [ ] ; Rash [ ]   Psych: Depression[ ] ; Anxiety[ ]   Heme: Bleeding problems [ ] ; Clotting disorders [ ] ; Anemia [ ]   Endocrine: Diabetes [ ] ; Thyroid dysfunction[ ]    Past Medical History:  Diagnosis Date   Cardiomyopathy (HCC)    CHF (congestive heart failure) (HCC)    Diabetes mellitus without complication (HCC)    DVT (deep venous thrombosis) (HCC)    Hyperlipidemia    Hypertension     Current Outpatient Medications  Medication Sig Dispense Refill   amiodarone (PACERONE) 200 MG tablet Take 1 tablet (200 mg total) by mouth daily. 30 tablet 3   apixaban (ELIQUIS) 5 MG TABS tablet Take 1 tablet (5 mg total) by mouth 2 (two) times daily. (Patient taking differently: Take 5 mg by mouth daily.) 60 tablet 4   digoxin (LANOXIN) 0.125 MG tablet Take 1 tablet (0.125 mg total) by mouth daily. 30 tablet 3   empagliflozin (JARDIANCE) 10 MG TABS tablet Take 1 tablet (10 mg total) by mouth daily. 30 tablet 5   sacubitril-valsartan (ENTRESTO) 24-26 MG Take 1 tablet by mouth 2 (two) times daily. 60 tablet 6   spironolactone (ALDACTONE) 25 MG tablet Take 1 tablet (25 mg total) by mouth daily. (Patient taking differently: Take 12.5 mg by mouth daily.) 30 tablet 5   torsemide (DEMADEX) 20 MG tablet Take 2 tablets (40 mg total) by mouth daily. 180 tablet 3   No current facility-administered medications for this visit.    No Known Allergies    Social History   Socioeconomic History   Marital status: Married    Spouse name: [name]   Number of children: 2   Years of education: Not on file   Highest education level: Not on file  Occupational History   Not on file  Tobacco Use   Smoking status: Never   Smokeless tobacco: Not on file  Vaping Use   Vaping status: Never Used  Substance and Sexual Activity   Alcohol use: No   Drug use: Never   Sexual activity: Not on file  Other Topics Concern   Not on file  Social History Narrative   1 step daughter    Social Drivers of Corporate investment banker Strain: Not on file  Food Insecurity: No Food Insecurity (07/02/2022)   Hunger Vital Sign    Worried About Running Out of Food in the Last Year: Never true    Ran Out of Food in the Last Year: Never true  Transportation Needs: No Transportation Needs (07/02/2022)   PRAPARE - Administrator, Civil Service (Medical): No    Lack of Transportation (Non-Medical): No  Physical Activity: Not on file  Stress: Not on file  Social Connections: Not on file  Intimate Partner Violence: Not on file     No family history on file.  There were no vitals filed for  this visit.     PHYSICAL EXAM: ReDs 42%  General:  Well appearing. No respiratory difficulty HEENT: normal Neck: supple. JVD 8 cm. Carotids 2+ bilat; no bruits. No lymphadenopathy or thyromegaly appreciated. Cor: PMI nondisplaced. Regular rate & rhythm. No rubs, gallops or murmurs. Lungs: clear Abdomen: soft, nontender, nondistended. No hepatosplenomegaly. No bruits or masses. Good bowel sounds. Extremities: no cyanosis, clubbing, rash, 1+ b/LE edema Neuro: alert & oriented x 3, cranial nerves grossly intact. moves all 4 extremities w/o difficulty. Affect pleasant.  ECG: NSR 97 bpm    ASSESSMENT & PLAN:  1. Chronic Systolic HF due to NICM - Echo 6/23: EF < 20% RV severely HK - Cath 6/23: No CAD. RA 16 PA 67/43 (54) PCW 27 Fick 5.8/2.3 - cMRI 06/23: EF 20% RV 27% Minimal LGE - recent ICU admission for cardiogenic shock 6/24 in setting of med noncompliance>>Echo 07/01/22: EF 20% RV severely down. Large LV clot. Moderate to severe MR. Moderate to severe TR. Required milrinone and NE support. Stabilized, weaned off inotropes/pressors and transitioned to oral GDMT  - today, NYHA Class II-early III. Volume overloaded on exam. ReDs 42%   - increase torsemide to 40 mg daily  - Continue Jardiance 10 mg daily - Stop Losartan. Start Entresto 24-26 mg bid  - Continue  Spironolactone 25 mg daily  - Continue digoxin 0.125mg , Check Dig level  - No beta blocker with recent low-output HF - Ideally needs transplant but previous non-compliance major issue. He has improved compliance since discharge. Encouraged to continue consistent use of meds + f/us   2. Pulmonary Embolism  - CT 06/30/22 - Right lower lobe proximal segmental pulmonary embolus. Minimal clot burden with no evidence of right heart strain.  - LE venous duplex negative  - s/p t-PA on 06/30/22 - Suspect PE is incidental due to severe RV stasis.  - Continue Eliquis. Educated on Eliquis and need for bid dosing. Increase to 5 mg bid   3. Atrial Flutter  - EKG today shows NSR  - Continue po amiodarone 200 mg daily. Check TSH/HFTs next visit   - Continue Eliquis. Denies abnormal bleeding.    4. LV thrombus - in setting of severe LV dysfunction/AK  - continue anticoagulation as above   5. Valvular heart disease - Echo 6/24 with EF  < 20%, moderate to severe MR and moderate to severe TR - Suspect functional - Treatment of this is HF optimization per above    6. DM2 - Most recent A1c 6.7  - on  SGLT2i - management per PCP     7.  Non-compliance - doing better w/ meds, with the exception of Eliquis as noted above. Concerned about the cost of meds. His new insurance will start 01/09/23. Samples supplied for Eliquis and Jardiance today. Given 30 day new Entresto card for free month supply. Pharmacy team helping to look at pt assistance options for future coverage  - discussed importance of strict compliance to help w/ management of HF and improve candidacy for advanced therapies if later needed   F/u BMP in 1 wk and w/ APP in 3-4 wks to reassess volume status.    Anderson Malta Tresckow, FNP 01/04/23

## 2023-01-07 ENCOUNTER — Ambulatory Visit (HOSPITAL_COMMUNITY)
Admission: RE | Admit: 2023-01-07 | Discharge: 2023-01-07 | Disposition: A | Discharge status: Home / Self Care | Payer: MEDICAID | Source: Ambulatory Visit | Attending: Family Medicine

## 2023-01-07 ENCOUNTER — Other Ambulatory Visit: Payer: Self-pay | Admitting: Family Medicine

## 2023-01-07 ENCOUNTER — Other Ambulatory Visit (HOSPITAL_COMMUNITY): Payer: Self-pay

## 2023-01-07 ENCOUNTER — Encounter (HOSPITAL_COMMUNITY): Payer: Self-pay

## 2023-01-07 ENCOUNTER — Other Ambulatory Visit (HOSPITAL_COMMUNITY): Payer: Self-pay | Admitting: Internal Medicine

## 2023-01-07 VITALS — BP 102/84 | HR 96 | Wt 263.6 lb

## 2023-01-07 DIAGNOSIS — Z86711 Personal history of pulmonary embolism: Secondary | ICD-10-CM

## 2023-01-07 DIAGNOSIS — Z91199 Patient's noncompliance with other medical treatment and regimen due to unspecified reason: Secondary | ICD-10-CM | POA: Insufficient documentation

## 2023-01-07 DIAGNOSIS — E669 Obesity, unspecified: Secondary | ICD-10-CM | POA: Insufficient documentation

## 2023-01-07 DIAGNOSIS — I38 Endocarditis, valve unspecified: Secondary | ICD-10-CM

## 2023-01-07 DIAGNOSIS — Z79899 Other long term (current) drug therapy: Secondary | ICD-10-CM | POA: Insufficient documentation

## 2023-01-07 DIAGNOSIS — Z7901 Long term (current) use of anticoagulants: Secondary | ICD-10-CM | POA: Insufficient documentation

## 2023-01-07 DIAGNOSIS — Z7984 Long term (current) use of oral hypoglycemic drugs: Secondary | ICD-10-CM | POA: Insufficient documentation

## 2023-01-07 DIAGNOSIS — I5022 Chronic systolic (congestive) heart failure: Secondary | ICD-10-CM | POA: Insufficient documentation

## 2023-01-07 DIAGNOSIS — Z6835 Body mass index (BMI) 35.0-35.9, adult: Secondary | ICD-10-CM | POA: Insufficient documentation

## 2023-01-07 DIAGNOSIS — I4892 Unspecified atrial flutter: Secondary | ICD-10-CM | POA: Insufficient documentation

## 2023-01-07 DIAGNOSIS — I428 Other cardiomyopathies: Secondary | ICD-10-CM | POA: Insufficient documentation

## 2023-01-07 DIAGNOSIS — E1165 Type 2 diabetes mellitus with hyperglycemia: Secondary | ICD-10-CM

## 2023-01-07 DIAGNOSIS — I110 Hypertensive heart disease with heart failure: Secondary | ICD-10-CM | POA: Insufficient documentation

## 2023-01-07 DIAGNOSIS — I513 Intracardiac thrombosis, not elsewhere classified: Secondary | ICD-10-CM | POA: Insufficient documentation

## 2023-01-07 DIAGNOSIS — I493 Ventricular premature depolarization: Secondary | ICD-10-CM | POA: Insufficient documentation

## 2023-01-07 DIAGNOSIS — I2699 Other pulmonary embolism without acute cor pulmonale: Secondary | ICD-10-CM | POA: Insufficient documentation

## 2023-01-07 DIAGNOSIS — E119 Type 2 diabetes mellitus without complications: Secondary | ICD-10-CM | POA: Insufficient documentation

## 2023-01-07 LAB — COMPREHENSIVE METABOLIC PANEL
ALT: 13 U/L (ref 0–44)
AST: 14 U/L — ABNORMAL LOW (ref 15–41)
Albumin: 3.6 g/dL (ref 3.5–5.0)
Alkaline Phosphatase: 70 U/L (ref 38–126)
Anion gap: 8 (ref 5–15)
BUN: 11 mg/dL (ref 6–20)
CO2: 25 mmol/L (ref 22–32)
Calcium: 8.8 mg/dL — ABNORMAL LOW (ref 8.9–10.3)
Chloride: 106 mmol/L (ref 98–111)
Creatinine, Ser: 1.03 mg/dL (ref 0.61–1.24)
GFR, Estimated: >60 mL/min (ref >60)
Glucose, Bld: 112 mg/dL — ABNORMAL HIGH (ref 70–99)
Potassium: 4.5 mmol/L (ref 3.5–5.1)
Sodium: 139 mmol/L (ref 135–145)
Total Bilirubin: 0.9 mg/dL (ref 0.0–1.2)
Total Protein: 7.4 g/dL (ref 6.5–8.1)

## 2023-01-07 LAB — CBC
HCT: 42.9 % (ref 39.0–52.0)
Hemoglobin: 13.5 g/dL (ref 13.0–17.0)
MCH: 27.2 pg (ref 26.0–34.0)
MCHC: 31.5 g/dL (ref 30.0–36.0)
MCV: 86.5 fL (ref 80.0–100.0)
Platelets: 274 10*3/uL (ref 150–400)
RBC: 4.96 MIL/uL (ref 4.22–5.81)
RDW: 15 % (ref 11.5–15.5)
WBC: 6.2 10*3/uL (ref 4.0–10.5)
nRBC: 0 % (ref 0.0–0.2)

## 2023-01-07 LAB — TSH: TSH: 2.855 u[IU]/mL (ref 0.350–4.500)

## 2023-01-07 LAB — BRAIN NATRIURETIC PEPTIDE: B Natriuretic Peptide: 1481.1 pg/mL — ABNORMAL HIGH (ref 0.0–100.0)

## 2023-01-07 MED ORDER — METOPROLOL SUCCINATE ER 25 MG PO TB24
25.0000 mg | ORAL_TABLET | Freq: Every evening | ORAL | 6 refills | Status: DC
  Filled 2023-01-07: qty 30, 30d supply, fill #0

## 2023-01-07 NOTE — Progress Notes (Signed)
ReDS Vest / Clip - 01/07/23 1100       ReDS Vest / Clip   Station Marker D    Ruler Value 34    ReDS Value Range Low volume    ReDS Actual Value 34

## 2023-01-07 NOTE — Patient Instructions (Addendum)
Thank you for coming in today  If you had labs drawn today, any labs that are abnormal the clinic will call you No news is good news  Medications: Start Toprol XL 25 mg nightly   Follow up appointments:  Your physician recommends that you schedule a follow-up appointment in:  2-3 months With Dr. Gala Romney with echocardiogram   Your physician has requested that you have an echocardiogram. Echocardiography is a painless test that uses sound waves to create images of your heart. It provides your doctor with information about the size and shape of your heart and how well your heart's chambers and valves are working. This procedure takes approximately one hour. There are no restrictions for this procedure.      Do the following things EVERYDAY: Weigh yourself in the morning before breakfast. Write it down and keep it in a log. Take your medicines as prescribed Eat low salt foods--Limit salt (sodium) to 2000 mg per day.  Stay as active as you can everyday Limit all fluids for the day to less than 2 liters   At the [facility], you and your health needs are our priority. As part of our continuing mission to provide you with exceptional heart care, we have created designated Provider Care Teams. These Care Teams include your primary Cardiologist (physician) and Advanced Practice Providers (APPs- Physician Assistants and Nurse Practitioners) who all work together to provide you with the care you need, when you need it.   You may see any of the following providers on your designated Care Team at your next follow up: Dr Arvilla Meres Dr Marca Ancona Dr. Marcos Eke, NP Robbie Lis, [place] [facility] [place], [place] Brynda Peon, NP Karle Plumber, PharmD   Please be sure to bring in all your medications bottles to every appointment.    Thank you for choosing [facility]  If you have any questions or  concerns before your next appointment please send [place] a message through [place] or call our office at [phone].    TO LEAVE A MESSAGE FOR THE NURSE SELECT OPTION 2, PLEASE LEAVE A MESSAGE INCLUDING: YOUR NAME DATE OF BIRTH CALL BACK NUMBER REASON FOR CALL**this is important as we prioritize the call backs  YOU WILL RECEIVE A CALL BACK THE SAME DAY AS LONG AS YOU CALL BEFORE 4:00 PM

## 2023-01-08 ENCOUNTER — Other Ambulatory Visit (HOSPITAL_COMMUNITY): Payer: Self-pay

## 2023-01-08 MED ORDER — EMPAGLIFLOZIN 10 MG PO TABS
10.0000 mg | ORAL_TABLET | Freq: Every day | ORAL | 1 refills | Status: DC
  Filled 2023-01-08 – 2023-01-24 (×3): qty 30, 30d supply, fill #0
  Filled 2023-02-17 – 2023-02-28 (×2): qty 30, 30d supply, fill #1

## 2023-01-10 ENCOUNTER — Other Ambulatory Visit (HOSPITAL_COMMUNITY): Payer: Self-pay

## 2023-01-10 ENCOUNTER — Telehealth (HOSPITAL_COMMUNITY): Payer: Self-pay

## 2023-01-10 DIAGNOSIS — I5043 Acute on chronic combined systolic (congestive) and diastolic (congestive) heart failure: Secondary | ICD-10-CM

## 2023-01-10 DIAGNOSIS — I5022 Chronic systolic (congestive) heart failure: Secondary | ICD-10-CM

## 2023-01-10 MED ORDER — TORSEMIDE 20 MG PO TABS
60.0000 mg | ORAL_TABLET | Freq: Every day | ORAL | 3 refills | Status: DC
  Filled 2023-01-10 (×4): qty 90, 30d supply, fill #0
  Filled 2023-02-13 – 2023-02-28 (×2): qty 90, 30d supply, fill #1
  Filled 2023-04-30: qty 90, 30d supply, fill #2

## 2023-01-10 NOTE — Telephone Encounter (Signed)
-----   Message from Hilda sent at 01/10/2023  9:36 AM EST ----- BNP creeping up. Watch for signs of volume overload  Increase torsemide to 60 mg daily, Repeat BMET in 10-14 days

## 2023-01-10 NOTE — Telephone Encounter (Signed)
 Spoke with patient regarding the following results. Patient made aware and patient verbalized understanding.   Repeat blood work ordered and scheduled.   Patient states he needs help with his new insurance and his entresto  prescription- advised him I would check with pharmacy regarding this. Patient verbalized understanding.

## 2023-01-10 NOTE — Telephone Encounter (Addendum)
 [facility] Patient Advocate Encounter  Prior authorization for Entresto  has been submitted and approved. Test billing returns $50 for 30 day supply. Insurance limits 30 days. Pt is eligible to use copay savings card. Informed pt by phone.  KeyBETHA MUNDA Effective: 01/10/2023 to 01/10/2024  Rachel DEL, CPhT Rx Patient Advocate Phone: [phone]

## 2023-01-14 ENCOUNTER — Encounter (HOSPITAL_COMMUNITY): Payer: Self-pay | Admitting: Internal Medicine

## 2023-01-14 ENCOUNTER — Other Ambulatory Visit (HOSPITAL_COMMUNITY): Payer: Self-pay

## 2023-01-15 NOTE — Telephone Encounter (Signed)
 Medication Samples have been provided to the patient.  Drug name: entresto        Strength: 24/26        Qty: 28  LOT: [id number]  Exp.Date: 04/2024  Dosing instructions: ONE TAB TWICE A DAY   The patient has been instructed regarding the correct time, dose, and frequency of taking this medication, including desired effects and most common side effects.   [name] 10:30 AM 01/15/2023  Medication Samples have been provided to the patient.  Drug name: jardiance        Strength: 10 mg        Qty: 14  LOT: [id number]  Exp.Date: 11/2024  Dosing instructions: one tab daily   The patient has been instructed regarding the correct time, dose, and frequency of taking this medication, including desired effects and most common side effects.   [name] 11:06 AM 01/15/2023  Medication Samples have been provided to the patient.  Drug name: eliquis        Strength: 5mg         Qty: 28  LOT: [id number]  Exp.Date: 01/2024  Dosing instructions: ONE TAB TWICE A DAY  The patient has been instructed regarding the correct time, dose, and frequency of taking this medication, including desired effects and most common side effects.   [name] 11:07 AM 01/15/2023

## 2023-01-16 ENCOUNTER — Ambulatory Visit: Payer: 59 | Admitting: Family Medicine

## 2023-01-16 ENCOUNTER — Other Ambulatory Visit (HOSPITAL_COMMUNITY): Payer: Self-pay

## 2023-01-16 VITALS — BP 130/82 | HR 70 | Temp 97.9°F | Wt 267.4 lb

## 2023-01-16 DIAGNOSIS — R051 Acute cough: Secondary | ICD-10-CM

## 2023-01-16 DIAGNOSIS — B349 Viral infection, unspecified: Secondary | ICD-10-CM

## 2023-01-16 DIAGNOSIS — R110 Nausea: Secondary | ICD-10-CM | POA: Diagnosis not present

## 2023-01-16 LAB — POCT INFLUENZA A/B
Influenza A, POC: NEGATIVE
Influenza B, POC: NEGATIVE

## 2023-01-16 LAB — POC COVID19 BINAXNOW: SARS Coronavirus 2 Ag: NEGATIVE

## 2023-01-16 MED ORDER — DM-GUAIFENESIN ER 30-600 MG PO TB12
1.0000 | ORAL_TABLET | Freq: Two times a day (BID) | ORAL | 0 refills | Status: AC
  Filled 2023-01-16: qty 20, 10d supply, fill #0

## 2023-01-16 MED ORDER — ACETAMINOPHEN ER 650 MG PO TBCR: 650.0000 mg | EXTENDED_RELEASE_TABLET | Freq: Three times a day (TID) | ORAL | Status: AC | PRN

## 2023-01-16 MED ORDER — PROMETHAZINE HCL 25 MG PO TABS
25.0000 mg | ORAL_TABLET | Freq: Three times a day (TID) | ORAL | 0 refills | Status: AC | PRN
  Filled 2023-01-16: qty 20, 7d supply, fill #0

## 2023-01-16 NOTE — Progress Notes (Signed)
 Assessment/Plan:   Problem List Items Addressed This Visit       Other   Viral illness - Primary   Patient presents with symptoms consistent with a viral illness, including vomiting, subjective fever, cough, and lower back pain. COVID-19 and influenza tests are negative.  Plan: Prescribe Promethazine  25 mg every 6 hours as needed for nausea. Recommend over-the-counter Dextromethorphan /Guaifenesin  as directed for cough relief. Advise use of Acetaminophen  650 mg every 8 hours as needed for body aches and fever. Encourage adequate hydration. Advise against the use of NSAIDs due to anticoagulation history. Return to clinic if symptoms worsen or if chest pain or shortness of breath develops.       Relevant Medications   acetaminophen  (TYLENOL  8 HOUR) 650 MG CR tablet   Other Visit Diagnoses       Nausea       Relevant Medications   promethazine  (PHENERGAN ) 25 MG tablet     Acute cough       Relevant Medications   dextromethorphan -guaiFENesin  (MUCINEX  DM) 30-600 MG 12hr tablet   Other Relevant Orders   POC COVID-19 BinaxNow (Completed)   POCT Influenza A/B (Completed)       There are no discontinued medications.  Return if symptoms worsen or fail to improve.    Subjective:   Encounter date: 01/16/2023  Jerry Saunders is a 53 y.o. male who has Acute on chronic combined systolic and diastolic heart failure (HCC); Acute hypoxic respiratory failure (HCC); Elevated d-dimer; Type 2 diabetes mellitus (HCC); Normocytic anemia; DVT (deep venous thrombosis) (HCC); Acute combined systolic and diastolic congestive heart failure (HCC); Shock circulatory (HCC); Acute pulmonary embolism (HCC); Acute febrile illness; Syncope and collapse; Severe pulmonary hypertension (HCC); Elevated troponin I level; Ventricular ectopy; History of DVT (deep vein thrombosis); Morbid obesity (HCC); Noncompliance; Non-insulin  dependent type 2 diabetes mellitus (HCC); 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   He  has a past medical history of Cardiomyopathy (HCC), CHF (congestive heart failure) (HCC), Diabetes mellitus without complication (HCC), DVT (deep venous thrombosis) (HCC), Hyperlipidemia, and Hypertension..   Chief Complaint:  Vomiting, fever, cough, and lower back pain.  History of Present Illness:  Patient reports onset of symptoms beginning Monday night, worsening by Wednesday morning. Yesterday at work, they experienced two episodes of vomiting and were sent home early. Upon returning home, they felt warm and believed they had a fever, although no temperature was measured. They also report a persistent cough and lower back pain. No vomiting today, and symptoms have slightly improved after resting and taking over-the-counter multi-symptom cold medication.  Review of Systems:  Constitutional: Positive for vomiting (twice yesterday), subjective fever. Respiratory: Positive for cough; denies shortness of breath, denies wheezing. Gastrointestinal: Denies abdominal pain, denies diarrhea. Musculoskeletal: Positive for lower back pain. Cardiovascular: Denies chest pain. Neurological: No headaches or dizziness reported. Psychiatric: Denies anxiety or depression. All other systems: Negative.  Past Surgical History:  Procedure Laterality Date   RIGHT/LEFT HEART CATH AND CORONARY ANGIOGRAPHY N/A 06/20/2021   Procedure: RIGHT/LEFT HEART CATH AND CORONARY ANGIOGRAPHY;  Surgeon: Elmira Newman PARAS, MD;  Location: [facility];  Service: Cardiovascular;  Laterality: N/A;  Outpatient Medications Prior to Visit  Medication Sig Dispense Refill   amiodarone  (PACERONE ) 200 MG tablet Take 1 tablet (200 mg total) by mouth daily. 30 tablet 3   apixaban  (ELIQUIS ) 5 MG TABS tablet Take 5 mg by mouth 2 (two) times daily.     digoxin  (LANOXIN ) 0.125 MG tablet Take 1 tablet (0.125 mg total) by mouth daily. 30 tablet 3   empagliflozin  (JARDIANCE ) 10 MG TABS tablet Take 1 tablet (10 mg total) by mouth daily. 30 tablet 1   sacubitril -valsartan  (ENTRESTO ) 24-26 MG Take 1 tablet by mouth 2 (two) times daily. 60 tablet 6   spironolactone  (ALDACTONE ) 25 MG tablet Take 1 tablet (25 mg total) by mouth daily. (Patient taking differently: Take 12.5 mg by mouth daily.) 30 tablet 5   torsemide  (DEMADEX ) 20 MG tablet Take 3 tablets (60 mg total) by mouth daily. 90 tablet 3   metoprolol  succinate (TOPROL  XL) 25 MG 24 hr tablet Take 1 tablet (25 mg total) by mouth at bedtime. (Patient not taking: Reported on 01/16/2023) 30 tablet 6   No facility-administered medications prior to visit.    No family history on file.  Social History   Socioeconomic History   Marital status: Married    Spouse name: Etta   Number of children: 2   Years of education: Not on file   Highest education level: Not on file  Occupational History   Not on file  Tobacco Use   Smoking status: Never   Smokeless tobacco: Not on file  Vaping Use   Vaping status: Never Used  Substance and Sexual Activity   Alcohol use: No   Drug use: Never   Sexual activity: Not on file  Other Topics Concern   Not on file  Social History Narrative   1 step daughter   Social Drivers of Corporate Investment Banker Strain: Not on file  Food Insecurity: No Food Insecurity (07/02/2022)   Hunger Vital Sign    Worried About Running Out of Food in the Last Year: Never true    Ran Out of Food in the Last Year: Never true  Transportation Needs: No Transportation Needs (07/02/2022)   PRAPARE - Scientist, Research (physical Sciences) (Medical): No    Lack of Transportation (Non-Medical): No  Physical Activity: Not on file  Stress: Not on file  Social Connections: Not on file  Intimate Partner Violence: Not on file                                                                                                  Objective:  Physical Exam: BP 130/82 (BP Location: Left Arm, Patient Position: Sitting, Cuff Size: Large)   Pulse 70   Temp 97.9 F (36.6 C) (Temporal)   Wt 267 lb 6.4 oz (121.3 kg)   SpO2 97%   BMI 35.52 kg/m   Wt Readings from Last 3 Encounters:  01/16/23 267 lb 6.4 oz (121.3 kg)  01/07/23 263 lb 9.6 oz (119.6 kg)  11/29/22 266 lb (120.7 kg)     Physical Exam Constitutional:  Appearance: Normal appearance.  HENT:     Head: Normocephalic and atraumatic.     Right Ear: Hearing normal.     Left Ear: Hearing normal.     Nose: Nose normal.  Eyes:     General: No scleral icterus.       Right eye: No discharge.        Left eye: No discharge.     Extraocular Movements: Extraocular movements intact.  Cardiovascular:     Rate and Rhythm: Normal rate and regular rhythm.     Heart sounds: Normal heart sounds.  Pulmonary:     Effort: Pulmonary effort is normal.     Breath sounds: Normal breath sounds.  Abdominal:     Palpations: Abdomen is soft.     Tenderness: There is no abdominal tenderness.  Skin:    General: Skin is warm.     Findings: No rash.  Neurological:     General: No focal deficit present.     Mental Status: He is alert.     Cranial Nerves: No cranial nerve deficit.  Psychiatric:        Mood and Affect: Mood normal.        Behavior: Behavior normal.        Thought Content: Thought content normal.        Judgment: Judgment normal.     No results found.  Recent Results (from the past 2160 hours)  POCT glycosylated hemoglobin (Hb A1C)     Status: Abnormal   Collection Time: 11/13/22  3:49 PM  Result Value Ref Range   Hemoglobin A1C 5.9 (A) 4.0 - 5.6 %    HbA1c POC (<> result, manual entry)     HbA1c, POC (prediabetic range)     HbA1c, POC (controlled diabetic range)    Basic Metabolic Panel (BMET)     Status: Abnormal   Collection Time: 11/29/22  3:51 PM  Result Value Ref Range   Sodium 140 135 - 145 mmol/L   Potassium 3.9 3.5 - 5.1 mmol/L   Chloride 103 98 - 111 mmol/L   CO2 28 22 - 32 mmol/L   Glucose, Bld 106 (H) 70 - 99 mg/dL    Comment: Glucose reference range applies only to samples taken after fasting for at least 8 hours.   BUN 15 6 - 20 mg/dL   Creatinine, Ser 8.64 (H) 0.61 - 1.24 mg/dL   Calcium  8.7 (L) 8.9 - 10.3 mg/dL   GFR, Estimated >39 >39 mL/min    Comment: (NOTE) Calculated using the CKD-EPI Creatinine Equation (2021)    Anion gap 9 5 - 15    Comment: Performed at [facility], [address] [place], [place] [address]  B Nat Peptide     Status: Abnormal   Collection Time: 11/29/22  3:51 PM  Result Value Ref Range   B Natriuretic Peptide 1,077.5 (H) 0.0 - 100.0 pg/mL    Comment: Performed at [facility], [address], [place], [place] [address]  Digoxin  level     Status: Abnormal   Collection Time: 11/29/22  3:51 PM  Result Value Ref Range   Digoxin  Level 0.7 (L) 0.8 - 2.0 ng/mL    Comment: Performed at [facility], [address] [place], [place] [address]  Basic Metabolic Panel (BMET)     Status: Abnormal   Collection Time: 12/17/22 10:15 AM  Result Value Ref Range   Sodium 140 135 - 145 mmol/L   Potassium  3.9 3.5 - 5.1 mmol/L   Chloride 103 98 - 111 mmol/L   CO2 29 22 - 32 mmol/L   Glucose, Bld 167 (H) 70 - 99 mg/dL    Comment: Glucose reference range applies only to samples taken after fasting for at least 8 hours.   BUN 17 6 - 20 mg/dL   Creatinine, Ser 8.67 (H) 0.61 - 1.24 mg/dL   Calcium  8.7 (L) 8.9 - 10.3 mg/dL   GFR, Estimated >39 >39 mL/min    Comment: (NOTE) Calculated using the CKD-EPI Creatinine Equation (2021)    Anion gap 8 5 - 15    Comment: Performed at [facility], [address] [place], [place] [address]  Comprehensive Metabolic Panel (CMET)     Status: Abnormal   Collection Time: 01/07/23 11:50 AM  Result Value Ref Range   Sodium 139 135 - 145 mmol/L   Potassium 4.5 3.5 - 5.1 mmol/L   Chloride 106 98 - 111 mmol/L   CO2 25 22 - 32 mmol/L   Glucose, Bld 112 (H) 70 - 99 mg/dL    Comment: Glucose reference range applies only to samples taken after fasting for at least 8 hours.   BUN 11 6 - 20 mg/dL   Creatinine, Ser 8.96 0.61 - 1.24 mg/dL   Calcium  8.8 (L) 8.9 - 10.3 mg/dL   Total Protein 7.4 6.5 - 8.1 g/dL   Albumin 3.6 3.5 - 5.0 g/dL   AST 14 (L) 15 - 41 U/L   ALT 13 0 - 44 U/L   Alkaline Phosphatase 70 38 - 126 U/L   Total Bilirubin 0.9 0.0 - 1.2 mg/dL   GFR, Estimated >39 >39 mL/min    Comment: (NOTE) Calculated using the CKD-EPI Creatinine Equation (2021)    Anion gap 8 5 - 15    Comment: Performed at [facility], [address] [place], [place] [address]  TSH     Status: None   Collection Time: 01/07/23 11:50 AM  Result Value Ref Range   TSH 2.855 0.350 - 4.500 uIU/mL    Comment: Performed by a 3rd Generation assay with a functional sensitivity of <=0.01 uIU/mL. Performed at [facility], [address], [place], [place] [address]   B Nat Peptide     Status: Abnormal   Collection Time: 01/07/23 11:50 AM  Result Value Ref Range   B Natriuretic Peptide 1,481.1 (H) 0.0 - 100.0 pg/mL    Comment: Performed at [facility], [address], [place], [place] [address]  CBC     Status: None   Collection Time: 01/07/23 11:50 AM  Result Value Ref Range   WBC 6.2 4.0 - 10.5 K/uL   RBC 4.96 4.22 - 5.81 MIL/uL   Hemoglobin 13.5 13.0 - 17.0 g/dL   HCT 57.0 60.9 - 47.9 %   MCV 86.5 80.0 - 100.0 fL   MCH 27.2 26.0 - 34.0 pg   MCHC 31.5 30.0 - 36.0 g/dL   RDW 84.9 88.4 - 84.4 %   Platelets 274 150 - 400 K/uL   nRBC 0.0 0.0 - 0.2 %    Comment: Performed at [facility], [address]  [place], [place] [address]  POC COVID-19 BinaxNow     Status: None   Collection Time: 01/16/23  2:55 PM  Result Value Ref Range   SARS Coronavirus 2 Ag Negative Negative  POCT Influenza A/B     Status: None  Collection Time: 01/16/23  2:55 PM  Result Value Ref Range   Influenza A, POC Negative Negative   Influenza B, POC Negative Negative        Beverley Adine Hummer, MD, MS

## 2023-01-16 NOTE — Patient Instructions (Addendum)
 Please be sure to drink plenty of fluids.   For nausea, use promethazine    You may take the following OTC medications to help with symptoms:  For cough, use  cough syrups or other cough suppressants.  For headache, sore throat, fevers, muscle aches, chills, other pain, take tylenol   For congestion, use nasal sprays, or antihistamines  Please follow up if no improvement.   Go to ED if you have severe chest pain, fevers, shortness of breath or other worrisome symptoms.

## 2023-01-17 DIAGNOSIS — B349 Viral infection, unspecified: Secondary | ICD-10-CM | POA: Insufficient documentation

## 2023-01-17 NOTE — Assessment & Plan Note (Signed)
 Patient presents with symptoms consistent with a viral illness, including vomiting, subjective fever, cough, and lower back pain. COVID-19 and influenza tests are negative.  Plan: Prescribe Promethazine  25 mg every 6 hours as needed for nausea. Recommend over-the-counter Dextromethorphan /Guaifenesin  as directed for cough relief. Advise use of Acetaminophen  650 mg every 8 hours as needed for body aches and fever. Encourage adequate hydration. Advise against the use of NSAIDs due to anticoagulation history. Return to clinic if symptoms worsen or if chest pain or shortness of breath develops.

## 2023-01-18 ENCOUNTER — Other Ambulatory Visit (HOSPITAL_COMMUNITY): Payer: Self-pay

## 2023-01-21 ENCOUNTER — Other Ambulatory Visit (HOSPITAL_COMMUNITY): Payer: Medicaid Other

## 2023-01-25 ENCOUNTER — Other Ambulatory Visit (HOSPITAL_COMMUNITY): Payer: Self-pay

## 2023-01-25 ENCOUNTER — Other Ambulatory Visit: Payer: Self-pay

## 2023-01-31 ENCOUNTER — Other Ambulatory Visit (HOSPITAL_COMMUNITY): Payer: Self-pay

## 2023-02-07 ENCOUNTER — Other Ambulatory Visit: Payer: Self-pay

## 2023-02-07 ENCOUNTER — Other Ambulatory Visit (HOSPITAL_COMMUNITY): Payer: Self-pay

## 2023-02-08 ENCOUNTER — Other Ambulatory Visit (HOSPITAL_COMMUNITY): Payer: Self-pay

## 2023-02-11 ENCOUNTER — Ambulatory Visit: Payer: 59 | Admitting: Cardiology

## 2023-02-17 ENCOUNTER — Other Ambulatory Visit: Payer: Self-pay | Admitting: Family Medicine

## 2023-02-18 ENCOUNTER — Other Ambulatory Visit: Payer: Self-pay

## 2023-02-18 NOTE — Telephone Encounter (Signed)
 Refill request for Eliquis  5 mg LR hx provider LOV  11/13/22 FOV  none scheduled.   Please review and advise.  Thanks. Dm/cma

## 2023-02-19 ENCOUNTER — Other Ambulatory Visit (HOSPITAL_COMMUNITY): Payer: Self-pay

## 2023-02-20 ENCOUNTER — Other Ambulatory Visit (HOSPITAL_COMMUNITY): Payer: Self-pay

## 2023-02-26 ENCOUNTER — Other Ambulatory Visit (HOSPITAL_COMMUNITY): Payer: Self-pay

## 2023-02-27 ENCOUNTER — Other Ambulatory Visit (HOSPITAL_COMMUNITY): Payer: Self-pay

## 2023-02-28 ENCOUNTER — Encounter (HOSPITAL_COMMUNITY): Payer: Self-pay

## 2023-02-28 ENCOUNTER — Other Ambulatory Visit (HOSPITAL_COMMUNITY): Payer: Self-pay

## 2023-02-28 ENCOUNTER — Other Ambulatory Visit: Payer: Self-pay

## 2023-02-28 ENCOUNTER — Other Ambulatory Visit: Payer: Self-pay | Admitting: Family Medicine

## 2023-03-04 ENCOUNTER — Other Ambulatory Visit (HOSPITAL_COMMUNITY): Payer: Self-pay

## 2023-03-11 ENCOUNTER — Ambulatory Visit (HOSPITAL_COMMUNITY): Payer: 59

## 2023-03-11 ENCOUNTER — Encounter (HOSPITAL_COMMUNITY): Payer: Medicaid Other | Admitting: Internal Medicine

## 2023-03-25 ENCOUNTER — Ambulatory Visit: Payer: 59 | Admitting: Cardiology

## 2023-03-25 ENCOUNTER — Other Ambulatory Visit (HOSPITAL_COMMUNITY): Payer: Self-pay | Admitting: Internal Medicine

## 2023-03-25 ENCOUNTER — Other Ambulatory Visit: Payer: Self-pay | Admitting: Family Medicine

## 2023-03-26 ENCOUNTER — Other Ambulatory Visit: Payer: Self-pay

## 2023-03-26 ENCOUNTER — Other Ambulatory Visit (HOSPITAL_COMMUNITY): Payer: Self-pay

## 2023-03-26 MED ORDER — EMPAGLIFLOZIN 10 MG PO TABS
10.0000 mg | ORAL_TABLET | Freq: Every day | ORAL | 1 refills | Status: DC
  Filled 2023-03-26: qty 30, 30d supply, fill #0
  Filled 2023-04-17 – 2023-04-18 (×2): qty 30, 30d supply, fill #1

## 2023-03-27 ENCOUNTER — Other Ambulatory Visit (HOSPITAL_COMMUNITY): Payer: Self-pay

## 2023-03-27 MED ORDER — AMIODARONE HCL 200 MG PO TABS
200.0000 mg | ORAL_TABLET | Freq: Every day | ORAL | 3 refills | Status: DC
  Filled 2023-03-27: qty 30, 30d supply, fill #0
  Filled 2023-04-17 – 2023-04-30 (×3): qty 30, 30d supply, fill #1
  Filled 2023-06-04: qty 30, 30d supply, fill #2
  Filled 2023-07-04 – 2023-07-22 (×2): qty 30, 30d supply, fill #3

## 2023-03-27 MED ORDER — SPIRONOLACTONE 25 MG PO TABS
12.5000 mg | ORAL_TABLET | Freq: Every day | ORAL | 5 refills | Status: DC
  Filled 2023-03-27: qty 15, 30d supply, fill #0
  Filled 2023-04-17 – 2023-04-30 (×3): qty 15, 30d supply, fill #1
  Filled 2023-05-29 – 2023-06-10 (×2): qty 15, 30d supply, fill #2
  Filled 2023-07-04 – 2023-07-22 (×2): qty 15, 30d supply, fill #3

## 2023-03-27 MED ORDER — DIGOXIN 125 MCG PO TABS
0.1250 mg | ORAL_TABLET | Freq: Every day | ORAL | 3 refills | Status: DC
  Filled 2023-03-27: qty 30, 30d supply, fill #0
  Filled 2023-04-17 – 2023-04-30 (×3): qty 30, 30d supply, fill #1
  Filled 2023-06-04: qty 30, 30d supply, fill #2
  Filled 2023-07-04 – 2023-07-22 (×2): qty 30, 30d supply, fill #3

## 2023-04-01 ENCOUNTER — Ambulatory Visit: Attending: Cardiology | Admitting: Cardiology

## 2023-04-05 ENCOUNTER — Other Ambulatory Visit (HOSPITAL_COMMUNITY): Payer: Self-pay

## 2023-04-05 ENCOUNTER — Telehealth (HOSPITAL_COMMUNITY): Payer: Self-pay

## 2023-04-05 NOTE — Telephone Encounter (Signed)
 Called to confirm/remind patient of their appointment at the [facility] on 04/08/23.   Appointment:   [] Confirmed  [x] Left mess   [] No answer/No voice mail  [] Phone not in service  And to bring in all medications and/or complete list.

## 2023-04-08 ENCOUNTER — Encounter (HOSPITAL_COMMUNITY): Payer: 59

## 2023-04-08 ENCOUNTER — Ambulatory Visit (HOSPITAL_COMMUNITY): Payer: 59

## 2023-04-17 ENCOUNTER — Other Ambulatory Visit (HOSPITAL_COMMUNITY): Payer: Self-pay

## 2023-04-17 ENCOUNTER — Other Ambulatory Visit: Payer: Self-pay | Admitting: Family Medicine

## 2023-04-17 ENCOUNTER — Telehealth: Payer: Self-pay | Admitting: Pharmacy Technician

## 2023-04-17 NOTE — Telephone Encounter (Signed)
 Requesting: apixaban (ELIQUIS) 5 MG TABS tablet  Last Visit: 01/16/2023 Next Visit: Visit date not found Last Refill: 03/27/2023 by Historical Provider  Please Advise

## 2023-04-17 NOTE — Telephone Encounter (Signed)
 Pharmacy Patient Advocate Encounter  Received notification from [facility] that Prior Authorization for entretso has been APPROVED from 01/10/23 to 01/10/24   PA #/Case ID/Reference #: [id number]

## 2023-04-17 NOTE — Telephone Encounter (Signed)
 Pharmacy Patient Advocate Encounter   Received notification from CoverMyMeds that prior authorization for entresto is required/requested.   Insurance verification completed.   The patient is insured through [facility] .   Per test claim: PA required; PA submitted to above mentioned insurance via CoverMyMeds Key/confirmation #/EOC [id number] Status is pending

## 2023-04-18 ENCOUNTER — Other Ambulatory Visit: Payer: Self-pay | Admitting: Family Medicine

## 2023-04-18 ENCOUNTER — Other Ambulatory Visit (HOSPITAL_COMMUNITY): Payer: Self-pay

## 2023-04-18 MED ORDER — APIXABAN 5 MG PO TABS
5.0000 mg | ORAL_TABLET | Freq: Two times a day (BID) | ORAL | 0 refills | Status: DC
  Filled 2023-04-18 – 2023-11-04 (×2): qty 60, 30d supply, fill #0

## 2023-04-18 MED ORDER — APIXABAN 5 MG PO TABS
5.0000 mg | ORAL_TABLET | Freq: Two times a day (BID) | ORAL | 4 refills | Status: DC
  Filled 2023-04-18: qty 60, 30d supply, fill #0
  Filled 2023-06-10 – 2023-07-22 (×3): qty 60, 30d supply, fill #1

## 2023-04-19 ENCOUNTER — Other Ambulatory Visit (HOSPITAL_COMMUNITY): Payer: Self-pay

## 2023-05-01 ENCOUNTER — Other Ambulatory Visit (HOSPITAL_COMMUNITY): Payer: Self-pay

## 2023-05-01 ENCOUNTER — Other Ambulatory Visit: Payer: Self-pay

## 2023-05-06 ENCOUNTER — Other Ambulatory Visit: Payer: Self-pay | Admitting: Family Medicine

## 2023-05-07 ENCOUNTER — Other Ambulatory Visit (HOSPITAL_COMMUNITY): Payer: Self-pay

## 2023-05-07 MED ORDER — EMPAGLIFLOZIN 10 MG PO TABS
10.0000 mg | ORAL_TABLET | Freq: Every day | ORAL | 1 refills | Status: DC
  Filled 2023-05-07 – 2023-06-04 (×2): qty 30, 30d supply, fill #0
  Filled 2023-07-04 – 2023-07-22 (×2): qty 30, 30d supply, fill #1

## 2023-05-27 ENCOUNTER — Encounter (HOSPITAL_COMMUNITY)

## 2023-05-27 ENCOUNTER — Ambulatory Visit (HOSPITAL_COMMUNITY)

## 2023-06-04 ENCOUNTER — Other Ambulatory Visit (HOSPITAL_COMMUNITY): Payer: Self-pay

## 2023-06-04 ENCOUNTER — Other Ambulatory Visit: Payer: Self-pay

## 2023-06-10 ENCOUNTER — Other Ambulatory Visit (HOSPITAL_COMMUNITY): Payer: Self-pay

## 2023-06-21 ENCOUNTER — Other Ambulatory Visit (HOSPITAL_COMMUNITY): Payer: Self-pay

## 2023-07-04 ENCOUNTER — Other Ambulatory Visit (HOSPITAL_COMMUNITY): Payer: Self-pay

## 2023-07-08 ENCOUNTER — Encounter (HOSPITAL_COMMUNITY)

## 2023-07-08 ENCOUNTER — Ambulatory Visit (HOSPITAL_COMMUNITY)

## 2023-07-16 ENCOUNTER — Other Ambulatory Visit (HOSPITAL_COMMUNITY): Payer: Self-pay

## 2023-07-23 ENCOUNTER — Other Ambulatory Visit (HOSPITAL_COMMUNITY): Payer: Self-pay

## 2023-07-24 ENCOUNTER — Other Ambulatory Visit: Payer: Self-pay | Admitting: Nurse Practitioner

## 2023-07-24 ENCOUNTER — Ambulatory Visit
Admission: RE | Admit: 2023-07-24 | Discharge: 2023-07-24 | Disposition: A | Discharge status: Home / Self Care | Payer: Worker's Compensation | Source: Ambulatory Visit | Attending: Nurse Practitioner | Admitting: Nurse Practitioner

## 2023-07-24 DIAGNOSIS — M25531 Pain in right wrist: Secondary | ICD-10-CM

## 2023-07-24 DIAGNOSIS — R609 Edema, unspecified: Secondary | ICD-10-CM

## 2023-07-31 ENCOUNTER — Other Ambulatory Visit (HOSPITAL_COMMUNITY): Payer: Self-pay

## 2023-08-12 ENCOUNTER — Ambulatory Visit (HOSPITAL_COMMUNITY)
Admission: RE | Admit: 2023-08-12 | Discharge: 2023-08-12 | Disposition: A | Discharge status: Home / Self Care | Source: Ambulatory Visit | Attending: Family Medicine | Admitting: Family Medicine

## 2023-08-12 ENCOUNTER — Encounter (HOSPITAL_COMMUNITY): Payer: Self-pay

## 2023-08-12 ENCOUNTER — Ambulatory Visit (HOSPITAL_COMMUNITY)
Admission: RE | Admit: 2023-08-12 | Discharge: 2023-08-12 | Disposition: A | Discharge status: Home / Self Care | Payer: Self-pay | Source: Ambulatory Visit | Attending: Family Medicine | Admitting: Family Medicine

## 2023-08-12 ENCOUNTER — Other Ambulatory Visit (HOSPITAL_COMMUNITY): Payer: Self-pay

## 2023-08-12 VITALS — BP 118/76 | HR 76 | Ht 74.0 in | Wt 273.2 lb

## 2023-08-12 DIAGNOSIS — E119 Type 2 diabetes mellitus without complications: Secondary | ICD-10-CM | POA: Diagnosis not present

## 2023-08-12 DIAGNOSIS — I4892 Unspecified atrial flutter: Secondary | ICD-10-CM | POA: Insufficient documentation

## 2023-08-12 DIAGNOSIS — R55 Syncope and collapse: Secondary | ICD-10-CM | POA: Insufficient documentation

## 2023-08-12 DIAGNOSIS — Z91199 Patient's noncompliance with other medical treatment and regimen due to unspecified reason: Secondary | ICD-10-CM | POA: Insufficient documentation

## 2023-08-12 DIAGNOSIS — Z86711 Personal history of pulmonary embolism: Secondary | ICD-10-CM | POA: Insufficient documentation

## 2023-08-12 DIAGNOSIS — Z86718 Personal history of other venous thrombosis and embolism: Secondary | ICD-10-CM | POA: Insufficient documentation

## 2023-08-12 DIAGNOSIS — Z7984 Long term (current) use of oral hypoglycemic drugs: Secondary | ICD-10-CM | POA: Diagnosis not present

## 2023-08-12 DIAGNOSIS — I5043 Acute on chronic combined systolic (congestive) and diastolic (congestive) heart failure: Secondary | ICD-10-CM

## 2023-08-12 DIAGNOSIS — Z7901 Long term (current) use of anticoagulants: Secondary | ICD-10-CM | POA: Insufficient documentation

## 2023-08-12 DIAGNOSIS — I110 Hypertensive heart disease with heart failure: Secondary | ICD-10-CM | POA: Insufficient documentation

## 2023-08-12 DIAGNOSIS — E1165 Type 2 diabetes mellitus with hyperglycemia: Secondary | ICD-10-CM

## 2023-08-12 DIAGNOSIS — I493 Ventricular premature depolarization: Secondary | ICD-10-CM | POA: Insufficient documentation

## 2023-08-12 DIAGNOSIS — I5022 Chronic systolic (congestive) heart failure: Secondary | ICD-10-CM

## 2023-08-12 DIAGNOSIS — I428 Other cardiomyopathies: Secondary | ICD-10-CM | POA: Diagnosis not present

## 2023-08-12 DIAGNOSIS — I513 Intracardiac thrombosis, not elsewhere classified: Secondary | ICD-10-CM

## 2023-08-12 DIAGNOSIS — Z79899 Other long term (current) drug therapy: Secondary | ICD-10-CM | POA: Insufficient documentation

## 2023-08-12 LAB — TSH: TSH: 2.84 u[IU]/mL (ref 0.350–4.500)

## 2023-08-12 LAB — COMPREHENSIVE METABOLIC PANEL WITH GFR
ALT: 14 U/L (ref 0–44)
AST: 16 U/L (ref 15–41)
Albumin: 3.6 g/dL (ref 3.5–5.0)
Alkaline Phosphatase: 71 U/L (ref 38–126)
Anion gap: 10 (ref 5–15)
BUN: 15 mg/dL (ref 6–20)
CO2: 25 mmol/L (ref 22–32)
Calcium: 8.6 mg/dL — ABNORMAL LOW (ref 8.9–10.3)
Chloride: 105 mmol/L (ref 98–111)
Creatinine, Ser: 1.01 mg/dL (ref 0.61–1.24)
GFR, Estimated: >60 mL/min (ref >60)
Glucose, Bld: 91 mg/dL (ref 70–99)
Potassium: 4 mmol/L (ref 3.5–5.1)
Sodium: 140 mmol/L (ref 135–145)
Total Bilirubin: 0.7 mg/dL (ref 0.0–1.2)
Total Protein: 7.6 g/dL (ref 6.5–8.1)

## 2023-08-12 LAB — DIGOXIN LEVEL: Digoxin Level: <0.4 ng/mL — ABNORMAL LOW (ref 0.8–2.0)

## 2023-08-12 LAB — ECHOCARDIOGRAM COMPLETE
Area-P 1/2: 5.46 cm2
Calc EF: 33.2 %
S' Lateral: 6.8 cm
Single Plane A2C EF: 32.9 %
Single Plane A4C EF: 34.2 %

## 2023-08-12 LAB — T4, FREE: Free T4: 1.18 ng/dL — ABNORMAL HIGH (ref 0.61–1.12)

## 2023-08-12 LAB — BRAIN NATRIURETIC PEPTIDE: B Natriuretic Peptide: 503.5 pg/mL — ABNORMAL HIGH (ref 0.0–100.0)

## 2023-08-12 MED ORDER — METOPROLOL SUCCINATE ER 25 MG PO TB24
25.0000 mg | ORAL_TABLET | Freq: Every evening | ORAL | 3 refills | Status: DC
  Filled 2023-08-12: qty 31, 31d supply, fill #0

## 2023-08-12 MED ORDER — TORSEMIDE 20 MG PO TABS
60.0000 mg | ORAL_TABLET | Freq: Every day | ORAL | 3 refills | Status: DC
  Filled 2023-08-12: qty 93, 31d supply, fill #0

## 2023-08-12 MED ORDER — SPIRONOLACTONE 25 MG PO TABS
12.5000 mg | ORAL_TABLET | Freq: Every day | ORAL | 3 refills | Status: AC
  Filled 2023-08-12: qty 45, 90d supply, fill #0
  Filled 2023-08-26: qty 15, 30d supply, fill #0
  Filled 2023-09-25 – 2023-10-23 (×3): qty 15, 30d supply, fill #1
  Filled 2023-11-24 – 2023-12-13 (×2): qty 15, 30d supply, fill #2
  Filled 2024-01-12 – 2024-01-29 (×2): qty 15, 30d supply, fill #3

## 2023-08-12 NOTE — Patient Instructions (Addendum)
 Thank you for coming in today  If you had labs drawn today, any labs that are abnormal the clinic will call you No news is good news  You have been referred to Pharmacy for GLP1 , their office will call you for further appointment details   Please call your Primary provider at [phone] to make a appointment   Medications: Restart Toprol  25 mg 1 tablet daily  Increase Torsemide  60 mg 3 tablets daily  Follow up appointments:  Your physician recommends that you schedule a follow-up appointment in:  2 months in clinic   Do the following things EVERYDAY: Weigh yourself in the morning before breakfast. Write it down and keep it in a log. Take your medicines as prescribed Eat low salt foods--Limit salt (sodium) to 2000 mg per day.  Stay as active as you can everyday Limit all fluids for the day to less than 2 liters   At the [facility], you and your health needs are our priority. As part of our continuing mission to provide you with exceptional heart care, we have created designated Provider Care Teams. These Care Teams include your primary Cardiologist (physician) and Advanced Practice Providers (APPs- Physician Assistants and Nurse Practitioners) who all work together to provide you with the care you need, when you need it.   You may see any of the following providers on your designated Care Team at your next follow up: Dr Toribio Fuel Dr Ezra Shuck Dr. Ria Gardenia Greig Lenetta, NP Caffie Shed, [place] [facility] [place], [place] Beckey Coe, NP Tinnie Redman, PharmD   Please be sure to bring in all your medications bottles to every appointment.    Thank you for choosing [facility]  If you have any questions or concerns before your next appointment please send us  a message through [place] or call our office at [phone].    TO LEAVE A MESSAGE FOR THE NURSE SELECT OPTION 2, PLEASE LEAVE A  MESSAGE INCLUDING: YOUR NAME DATE OF BIRTH CALL BACK NUMBER REASON FOR CALL**this is important as we prioritize the call backs  YOU WILL RECEIVE A CALL BACK THE SAME DAY AS LONG AS YOU CALL BEFORE 4:00 PM

## 2023-08-12 NOTE — Progress Notes (Signed)
 Advanced Heart Failure Clinic Note   PCP: Sebastian Beverley NOVAK, MD Primary Cardiologist: Dr. Michele AHF: Dr. Cherrie   CC: HF follow up  HPI: Jerry Saunders is a 53 y.o.. male with systolic HF due to [facility], DM2, obesity, PVCs, PE and noncompliance.     Admitted 6/24 with severeal weeks of HF symptoms after not taking meds for more than a month. Also reported syncopal episode several days prior to admission.  CT in ER showed right lower lobe proximal segmental pulmonary embolism, minimal clot burden with no evidence of right heart strain. He was tachycardic and visibly SOB requiring Bipap. Received t-PA per CCM.  Shortly after patient developed AFL at 150 bpm. Moved to ICU. Started on IV amio and low-dose NE. Ultimately required addition of milrinone  for support. Echo showed biventricular dysfunction, LVEF 20% w/ large LV clot, RV severely down. He converted to NSR on amio gtt. He diuresed w/ IV Lasix  and able to wean off milrinone  and NE support. GDMT added. Anticoagulated w/ Eliquis . Discharged home, wt 267 lb.   Has not been seen in [facility] since 12/24.   Echo today reviewed with Dr. Zenaida, felt that EF ~25-30%.  He returns today for heart failure follow up. Overall feeling well. NYHA I-II. Working with city water with storm drainage. Reports fatigue. Denies chest pain, dyspnea, orthopnea, palpitations, dizziness, and abnormal bleeding. Able to perform ADLs. Appetite okay, has been trying to cut back on salt intake and lose weight, however occasionally has bad food at work and doesn't tell his wife. Reports that he is trying to be more compliant with his medications and is good about the morning medications, but struggles with twice daily. We discussed using a timer for afternoon medications. Wife assists with pill box.   Cardiac Studies - Echo (6/24): EF 20%, + LV clot, RV severely down - cMRI (6/23): EF 20% RV 27% Minimal LGE - Cath (6/23): No CAD. RA 16 PA 67/43 (54) PCW 27 Fick 5.8/2.3 -  Echo (6/23): EF < 20% RV severely HK  Past Medical History:  Diagnosis Date   Cardiomyopathy (HCC)    CHF (congestive heart failure) (HCC)    Diabetes mellitus without complication (HCC)    DVT (deep venous thrombosis) (HCC)    Hyperlipidemia    Hypertension    Current Outpatient Medications  Medication Sig Dispense Refill   acetaminophen  (TYLENOL  8 HOUR) 650 MG CR tablet Take 1 tablet (650 mg total) by mouth every 8 (eight) hours as needed for pain.     amiodarone  (PACERONE ) 200 MG tablet Take 1 tablet (200 mg total) by mouth daily. 30 tablet 3   apixaban  (ELIQUIS ) 5 MG TABS tablet Take 1 tablet (5 mg total) by mouth 2 (two) times daily. 60 tablet 0   apixaban  (ELIQUIS ) 5 MG TABS tablet Take 1 tablet (5 mg total) by mouth 2 (two) times daily. 60 tablet 4   dextromethorphan -guaiFENesin  (MUCINEX  DM) 30-600 MG 12hr tablet Take 1 tablet by mouth 2 (two) times daily. 60 tablet 0   digoxin  (LANOXIN ) 0.125 MG tablet Take 1 tablet (0.125 mg total) by mouth daily. 30 tablet 3   empagliflozin  (JARDIANCE ) 10 MG TABS tablet Take 1 tablet (10 mg total) by mouth daily. 30 tablet 1   metoprolol  succinate (TOPROL  XL) 25 MG 24 hr tablet Take 1 tablet (25 mg total) by mouth at bedtime. (Patient not taking: Reported on 01/16/2023) 30 tablet 6   promethazine  (PHENERGAN ) 25 MG tablet Take 1 tablet (25 mg total)  by mouth every 8 (eight) hours as needed for nausea or vomiting. 20 tablet 0   sacubitril -valsartan  (ENTRESTO ) 24-26 MG Take 1 tablet by mouth 2 (two) times daily. 60 tablet 6   spironolactone  (ALDACTONE ) 25 MG tablet Take 0.5 tablets (12.5 mg total) by mouth daily. 30 tablet 5   torsemide  (DEMADEX ) 20 MG tablet Take 3 tablets (60 mg total) by mouth daily. 90 tablet 3   No current facility-administered medications for this visit.   No Known Allergies  Social History   Socioeconomic History   Marital status: Married    Spouse name: Etta   Number of children: 2   Years of education: Not on  file   Highest education level: Not on file  Occupational History   Not on file  Tobacco Use   Smoking status: Never   Smokeless tobacco: Not on file  Vaping Use   Vaping status: Never Used  Substance and Sexual Activity   Alcohol use: No   Drug use: Never   Sexual activity: Not on file  Other Topics Concern   Not on file  Social History Narrative   1 step daughter   Social Drivers of Corporate investment banker Strain: Not on file  Food Insecurity: No Food Insecurity (07/02/2022)   Hunger Vital Sign    Worried About Running Out of Food in the Last Year: Never true    Ran Out of Food in the Last Year: Never true  Transportation Needs: No Transportation Needs (07/02/2022)   PRAPARE - Administrator, Civil Service (Medical): No    Lack of Transportation (Non-Medical): No  Physical Activity: Not on file  Stress: Not on file  Social Connections: Not on file  Intimate Partner Violence: Not on file    No family history on file.  There were no vitals taken for this visit.  Wt Readings from Last 3 Encounters:  01/16/23 121.3 kg (267 lb 6.4 oz)  01/07/23 119.6 kg (263 lb 9.6 oz)  11/29/22 120.7 kg (266 lb)   PHYSICAL EXAM: General: Well appearing. No distress on RA. Walked into clinic Cardiac: JVP ~10cm. S1 and S2 present.  Extremities: Warm and dry.  No peripheral edema.  Neuro: Alert and oriented x3. Affect pleasant. Moves all extremities without difficulty.  ECG (personally reviewed): NSR with 1AVB, LAE 69 bpm  ASSESSMENT & PLAN:  1. Chronic Systolic HF due to NICM - Echo 6/23: EF < 20% RV severely HK - Cath 6/23: No CAD. RA 16 PA 67/43 (54) PCW 27 Fick 5.8/2.3 - cMRI 6/23: EF 20% RV 27% Minimal LGE -CGS in 6/24 d/t noncompliance. Required Milrinone  support. - Echo 6/24: EF 20% RV severely down. Large LV clot. Moderate to severe MR. Moderate to severe TR. - Echo today reviewed with Dr. Zenaida. EF felt to be ~25%. - NYHA I-II. Volume mildly up on exam.   - increase torsemide  to 60 mg daily, occasionally misses doses. BMET/BNP today - restart Toprol  XL 25 mg daily, had taken himself off as he felt this was causing him diarrhea - continue Jardiance  10 mg daily. - continue Entresto  24-26 mg bid. - continue spironolactone  12.5 mg daily.  - continue digoxin  0.125 mg daily, check level today - Ideally needs transplant but previous non-compliance major issue. He has improved compliance since discharge. Encouraged to continue consistent use of meds + f/u. Compliance improving. Would benefit from Paramedicine, however is not in [facility].   2. Pulmonary Embolism  - CT 6/24 -  Right lower lobe proximal segmental pulmonary embolus. Minimal clot burden with no evidence of right heart strain.  - LE venous duplex negative  - s/p t-PA on 06/30/22 - Suspect PE is incidental due to severe RV stasis.  - Continue Eliquis  5 mg bid, no  bleeding issues. Has been struggling to remember second daily dose.  3. Paroxysmal Atrial Flutter  - NSR on ECG today - Continue amiodarone  200 mg daily. Check LFTs and thyroid  labs. - Continue Eliquis  5 mg bid  4. LV thrombus - in setting of severe LV dysfunction/AK  - Continue Eliquis    5. Valvular heart disease - Echo 6/24 with EF < 20%, moderate to severe MR and moderate to severe TR - Echo read pending - Suspect functional - Treatment of this is HF optimization per above    6. DM2 - Most recent A1c 5.9  - Continue SGLT2i - Management per PCP   7. Obesity - has been trying to lose weight, but has not been successful - refer to [facility] for GLP1   8.  Non-compliance - Remains with issues with compliance, making attempts  - Discussed importance of strict compliance to help w/ management of HF and improve candidacy for advanced therapies if later needed  Needs to follow up with PCP. Will place number for schedule in AVS.   Follow up in 2 months with APP and 4 months with Dr. Bensimhon  [place] Dianelys Scinto,  NP 08/12/23

## 2023-08-13 ENCOUNTER — Ambulatory Visit (HOSPITAL_COMMUNITY): Payer: Self-pay | Admitting: Family Medicine

## 2023-08-13 ENCOUNTER — Ambulatory Visit (HOSPITAL_COMMUNITY): Payer: Self-pay | Admitting: Cardiology

## 2023-08-14 IMAGING — MR MR CARD MORPHOLOGY WO/W CM
45 of 48 series · 45 of 48 positions shown · IV contrast (Contrast agent)
Comparison: none

CLINICAL DATA: 50M presents with HFrEF (EF <20%). Normal coronary
arteries on cath

EXAM:
CARDIAC MRI
TECHNIQUE: The patient was scanned on a 1.5 Tesla Siemens magnet. A dedicated
cardiac coil was used. Functional imaging was done using Fiesta
sequences. [DATE], and 4 chamber views were done to assess for RWMA's.
Modified Bermeo rule using a short axis stack was used to
calculate an ejection fraction on a dedicated work station using
Circle software. The patient received 10 cc of Gadavist. After 10
minutes inversion recovery sequences were used to assess for
infiltration and scar tissue.
CONTRAST:  10 cc  of Gadavist

[Series 4: [id number] · axial · 8.0mm · 1.48mm/px · 1 of 20 slices shown]
[im 1/20]
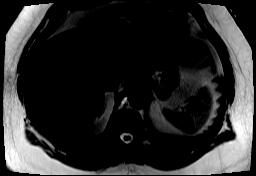

[Series 9: bSSFP · oblique · 8.0mm · 1.61mm/px · 1 of 25 slices shown (1 of 24)]
[im 1/25]
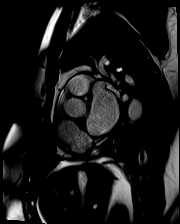

[Series 10: bSSFP · oblique · 8.0mm · 1.61mm/px · 1 of 25 slices shown (2 of 24)]
[im 1/25]
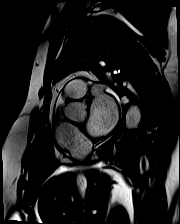

[Series 11: bSSFP · oblique · 8.0mm · 1.61mm/px · 1 of 25 slices shown (3 of 24)]
[im 1/25]
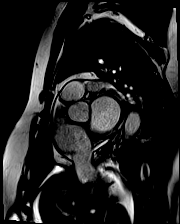

[Series 12: bSSFP · oblique · 8.0mm · 1.61mm/px · 1 of 25 slices shown (4 of 24)]
[im 1/25]
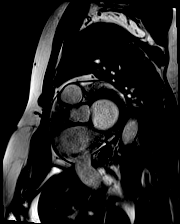

[Series 13: bSSFP · oblique · 8.0mm · 1.61mm/px · 1 of 25 slices shown (5 of 24)]
[im 1/25]
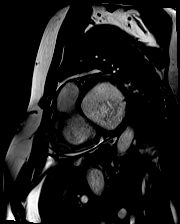

[Series 14: bSSFP · oblique · 8.0mm · 1.61mm/px · 1 of 25 slices shown (6 of 24)]
[im 1/25]
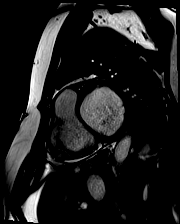

[Series 15: bSSFP · oblique · 8.0mm · 1.61mm/px · 1 of 25 slices shown (7 of 24)]
[im 1/25]
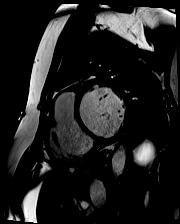

[Series 16: bSSFP · oblique · 8.0mm · 1.61mm/px · 1 of 25 slices shown (8 of 24)]
[im 1/25]
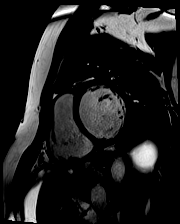

[Series 17: bSSFP · oblique · 8.0mm · 1.61mm/px · 1 of 25 slices shown (9 of 24)]
[im 1/25]
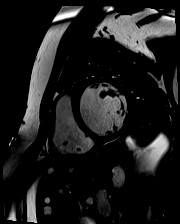

[Series 18: bSSFP · oblique · 8.0mm · 1.61mm/px · 1 of 25 slices shown (10 of 24)]
[im 1/25]
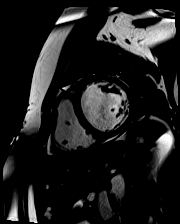

[Series 19: bSSFP · oblique · 8.0mm · 1.61mm/px · 1 of 25 slices shown (11 of 24)]
[im 1/25]
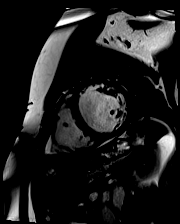

[Series 20: bSSFP · oblique · 8.0mm · 1.61mm/px · 1 of 25 slices shown (12 of 24)]
[im 1/25]
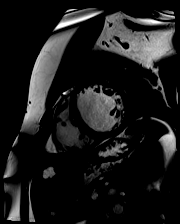

[Series 21: bSSFP · oblique · 8.0mm · 1.61mm/px · 1 of 25 slices shown (13 of 24)]
[im 1/25]
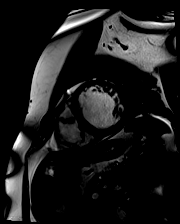

[Series 22: bSSFP · oblique · 8.0mm · 1.61mm/px · 1 of 25 slices shown (14 of 24)]
[im 1/25]
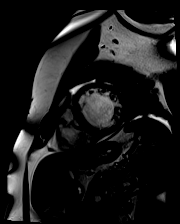

[Series 23: bSSFP · oblique · 8.0mm · 1.61mm/px · 1 of 25 slices shown (15 of 24)]
[im 1/25]
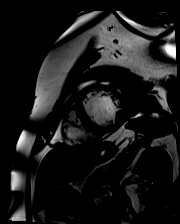

[Series 24: bSSFP · oblique · 8.0mm · 1.61mm/px · 1 of 25 slices shown (16 of 24)]
[im 1/25]
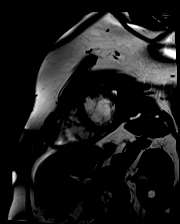

[Series 25: bSSFP · oblique · 8.0mm · 1.61mm/px · 1 of 25 slices shown (17 of 24)]
[im 1/25]
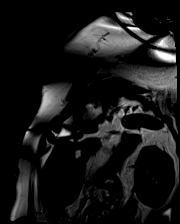

[Series 26: bSSFP · oblique · 8.0mm · 1.61mm/px · 1 of 25 slices shown (18 of 24)]
[im 1/25]
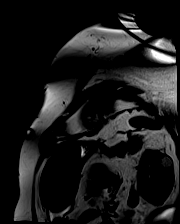

[Series 27: bSSFP · oblique · 8.0mm · 1.61mm/px · 1 of 25 slices shown (19 of 24)]
[im 1/25]
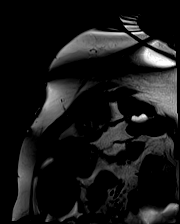

[Series 28: bSSFP · oblique · 8.0mm · 1.61mm/px · 1 of 25 slices shown (20 of 24)]
[im 1/25]
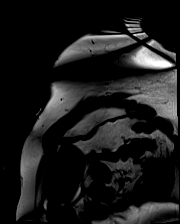

[Series 29: bSSFP · oblique · 8.0mm · 1.61mm/px · 1 of 25 slices shown (21 of 24)]
[im 1/25]
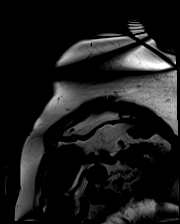

[Series 30: bSSFP · oblique · 6.0mm · 1.56mm/px · 1 of 25 slices shown (22 of 24)]
[im 1/25]
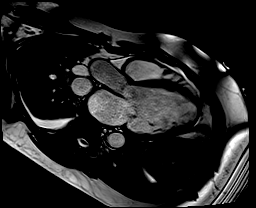

[Series 31: bSSFP · oblique · 6.0mm · 1.41mm/px · 1 of 25 slices shown (23 of 24)]
[im 1/25]
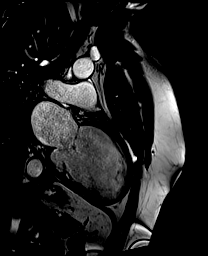

[Series 32: bSSFP · oblique · 6.0mm · 1.41mm/px · 1 of 25 slices shown (24 of 24)]
[im 1/25]
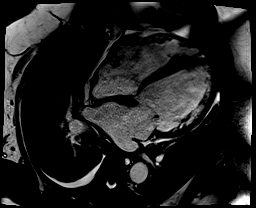

[Series 33: cine_trufi_short axis_cs_2_shot · oblique · 8.0mm · 1.48mm/px · 1 of 25 slices shown (1 of 20)]
[im 1/25]
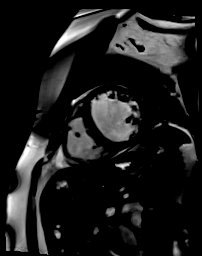

[Series 33: cine_trufi_short axis_cs_2_shot · oblique · 8.0mm · 1.48mm/px · 1 of 25 slices shown (2 of 20)]
[im 1/25]
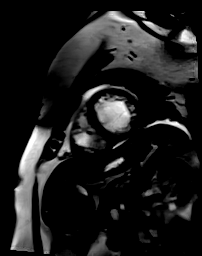

[Series 33: cine_trufi_short axis_cs_2_shot · oblique · 8.0mm · 1.48mm/px · 1 of 25 slices shown (3 of 20)]
[im 1/25]
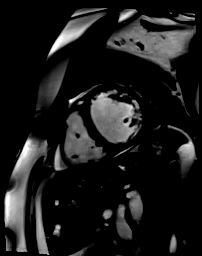

[Series 33: cine_trufi_short axis_cs_2_shot · oblique · 8.0mm · 1.48mm/px · 1 of 25 slices shown (4 of 20)]
[im 1/25]
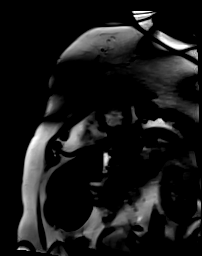

[Series 33: cine_trufi_short axis_cs_2_shot · oblique · 8.0mm · 1.48mm/px · 1 of 25 slices shown (5 of 20)]
[im 1/25]
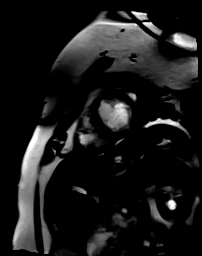

[Series 33: cine_trufi_short axis_cs_2_shot · oblique · 8.0mm · 1.48mm/px · 1 of 25 slices shown (6 of 20)]
[im 1/25]
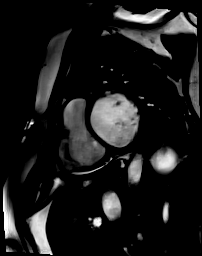

[Series 33: cine_trufi_short axis_cs_2_shot · oblique · 8.0mm · 1.48mm/px · 1 of 25 slices shown (7 of 20)]
[im 1/25]
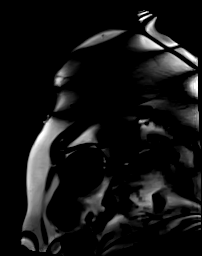

[Series 33: cine_trufi_short axis_cs_2_shot · oblique · 8.0mm · 1.48mm/px · 1 of 25 slices shown (8 of 20)]
[im 1/25]
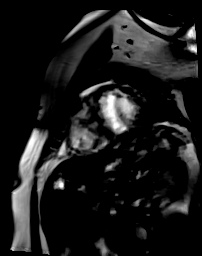

[Series 33: cine_trufi_short axis_cs_2_shot · oblique · 8.0mm · 1.48mm/px · 1 of 25 slices shown (9 of 20)]
[im 1/25]
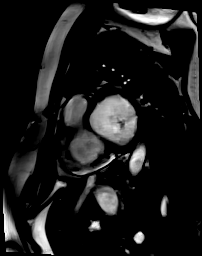

[Series 33: cine_trufi_short axis_cs_2_shot · oblique · 8.0mm · 1.48mm/px · 1 of 25 slices shown (10 of 20)]
[im 1/25]
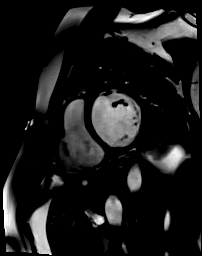

[Series 33: cine_trufi_short axis_cs_2_shot · oblique · 8.0mm · 1.48mm/px · 1 of 25 slices shown (11 of 20)]
[im 1/25]
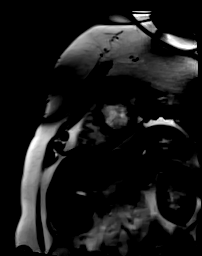

[Series 33: cine_trufi_short axis_cs_2_shot · oblique · 8.0mm · 1.48mm/px · 1 of 25 slices shown (12 of 20)]
[im 1/25]
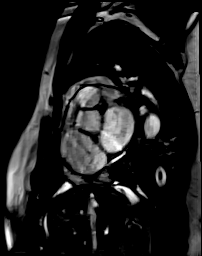

[Series 33: cine_trufi_short axis_cs_2_shot · oblique · 8.0mm · 1.48mm/px · 1 of 25 slices shown (13 of 20)]
[im 1/25]
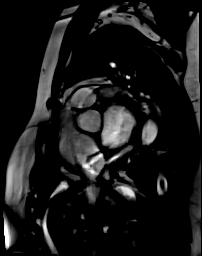

[Series 33: cine_trufi_short axis_cs_2_shot · oblique · 8.0mm · 1.48mm/px · 1 of 25 slices shown (14 of 20)]
[im 1/25]
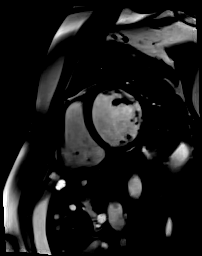

[Series 33: cine_trufi_short axis_cs_2_shot · oblique · 8.0mm · 1.48mm/px · 1 of 25 slices shown (15 of 20)]
[im 1/25]
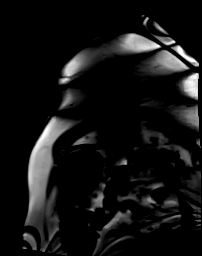

[Series 33: cine_trufi_short axis_cs_2_shot · oblique · 8.0mm · 1.48mm/px · 1 of 25 slices shown (16 of 20)]
[im 1/25]
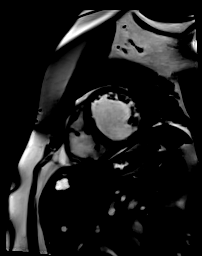

[Series 33: cine_trufi_short axis_cs_2_shot · oblique · 8.0mm · 1.48mm/px · 1 of 25 slices shown (17 of 20)]
[im 1/25]
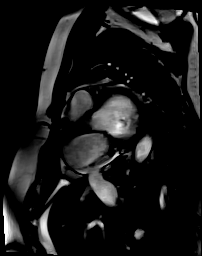

[Series 33: cine_trufi_short axis_cs_2_shot · oblique · 8.0mm · 1.48mm/px · 1 of 25 slices shown (18 of 20)]
[im 1/25]
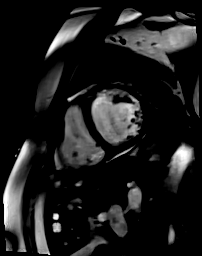

[Series 33: cine_trufi_short axis_cs_2_shot · oblique · 8.0mm · 1.48mm/px · 1 of 25 slices shown (19 of 20)]
[im 1/25]
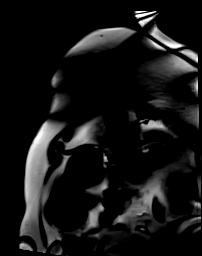

[Series 33: cine_trufi_short axis_cs_2_shot · oblique · 8.0mm · 1.48mm/px · 1 of 25 slices shown (20 of 20)]
[im 1/25]
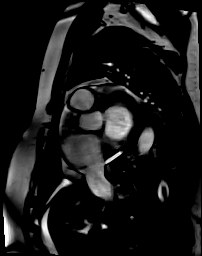

[45 of 48 positions shown; findings below may reference images not displayed]

FINDINGS: Left ventricle:

-Severe dilatation

-Severe systolic dysfunction

-Elevated ECV (36%)

-RV insertion site LGE

LV EF: 20% (Normal 56-78%)

Absolute volumes:

LV EDV: 444mL (Normal 77-195 mL)

LV ESV: 353mL (Normal 19-72 mL)

LV SV: 91mL (Normal 51-133 mL)

CO: 9.3L/min (Normal 2.8-8.8 L/min)

Indexed volumes:

LV EDV: 170mL/sq-m (Normal 47-92 mL/sq-m)

LV ESV: 135mL/sq-m (Normal 13-30 mL/sq-m)

LV SV: 35mL/sq-m (Normal 32-62 mL/sq-m)

CI: 3.6L/min/sq-m (Normal 1.7-4.2 L/min/sq-m)

Right ventricle: Moderate enlargement with severe systolic
dysfunction

RV EF:  27% (Normal 47-74%)

Absolute volumes:

RV EDV: 348mL (Normal 88-227 mL)

RV ESV: 255mL (Normal 23-103 mL)

RV SV: 92mL (Normal 52-138 mL)

CO: 9.5L/min (Normal 2.8-8.8 L/min)

Indexed volumes:

RV EDV: 133mL/sq-m (Normal 55-105 mL/sq-m)

RV ESV: 98mL/sq-m (Normal 15-43 mL/sq-m)

RV SV: 35mL/sq-m (Normal 32-64 mL/sq-m)

CI: 3.6L/min/sq-m (Normal 1.7-4.2 L/min/sq-m)

Left atrium: Moderate enlargement

Right atrium: Moderate enlargement

Mitral valve: Mild regurgitation

Aortic valve: Tricuspid. No regurgitation

Tricuspid valve: Mild regurgitation

Pulmonic valve: No regurgitation

Aorta: Normal proximal ascending aorta

Pulmonary artery: Dilated main PA measuring 31mm

Pericardium: Normal
IMPRESSION: 1.  Severe LV dilatation with severe systolic dysfunction (EF 20%)

2.  Moderate RV dilatation with severe systolic dysfunction (EF 27%)

3. RV insertion site late gadolinium enhancement, which is a
nonspecific finding often seen in setting of elevated pulmonary
pressures. No other LGE seen

## 2023-08-21 ENCOUNTER — Other Ambulatory Visit (HOSPITAL_COMMUNITY): Payer: Self-pay

## 2023-08-26 ENCOUNTER — Other Ambulatory Visit (HOSPITAL_COMMUNITY): Payer: Self-pay | Admitting: Internal Medicine

## 2023-08-26 ENCOUNTER — Other Ambulatory Visit: Payer: Self-pay | Admitting: Family Medicine

## 2023-08-27 ENCOUNTER — Other Ambulatory Visit (HOSPITAL_COMMUNITY): Payer: Self-pay

## 2023-08-27 ENCOUNTER — Other Ambulatory Visit: Payer: Self-pay

## 2023-08-27 MED ORDER — AMIODARONE HCL 200 MG PO TABS
200.0000 mg | ORAL_TABLET | Freq: Every day | ORAL | 3 refills | Status: AC
  Filled 2023-08-27: qty 30, 30d supply, fill #0
  Filled 2023-09-25 – 2023-10-23 (×3): qty 30, 30d supply, fill #1
  Filled 2023-11-24 – 2023-12-13 (×2): qty 30, 30d supply, fill #2
  Filled 2024-01-12 – 2024-01-29 (×2): qty 30, 30d supply, fill #3

## 2023-08-27 MED ORDER — DIGOXIN 125 MCG PO TABS
0.1250 mg | ORAL_TABLET | Freq: Every day | ORAL | 3 refills | Status: AC
  Filled 2023-08-27: qty 30, 30d supply, fill #0
  Filled 2023-09-25 – 2023-10-23 (×3): qty 30, 30d supply, fill #1
  Filled 2023-11-24 – 2023-12-13 (×2): qty 30, 30d supply, fill #2
  Filled 2024-01-12 – 2024-01-29 (×2): qty 30, 30d supply, fill #3

## 2023-08-27 MED ORDER — EMPAGLIFLOZIN 10 MG PO TABS
10.0000 mg | ORAL_TABLET | Freq: Every day | ORAL | 1 refills | Status: DC
  Filled 2023-08-27: qty 30, 30d supply, fill #0
  Filled 2023-09-25 – 2023-10-23 (×3): qty 30, 30d supply, fill #1

## 2023-09-12 ENCOUNTER — Encounter: Admitting: Family Medicine

## 2023-09-26 ENCOUNTER — Other Ambulatory Visit (HOSPITAL_COMMUNITY): Payer: Self-pay

## 2023-09-30 ENCOUNTER — Ambulatory Visit: Admitting: Emergency Medicine

## 2023-10-07 ENCOUNTER — Other Ambulatory Visit (HOSPITAL_COMMUNITY): Payer: Self-pay

## 2023-10-09 ENCOUNTER — Encounter: Admitting: Family Medicine

## 2023-10-09 ENCOUNTER — Telehealth: Payer: Self-pay | Admitting: Family Medicine

## 2023-10-09 ENCOUNTER — Encounter: Payer: Self-pay | Admitting: Family Medicine

## 2023-10-09 NOTE — Telephone Encounter (Signed)
 09/30/2023 & 10/09/2023 same day cancellations  Final warning sent via mail and mychart.

## 2023-10-10 ENCOUNTER — Other Ambulatory Visit (HOSPITAL_COMMUNITY): Payer: Self-pay

## 2023-10-14 ENCOUNTER — Encounter (HOSPITAL_COMMUNITY)

## 2023-10-16 ENCOUNTER — Telehealth (HOSPITAL_COMMUNITY): Payer: Self-pay

## 2023-10-16 NOTE — Telephone Encounter (Signed)
 Called to confirm/remind patient of their appointment at the [facility] on 10/17/23. However, pt rescheduled for a later date.

## 2023-10-17 ENCOUNTER — Encounter (HOSPITAL_COMMUNITY)

## 2023-10-18 ENCOUNTER — Ambulatory Visit: Admitting: Family Medicine

## 2023-10-22 ENCOUNTER — Other Ambulatory Visit (HOSPITAL_COMMUNITY): Payer: Self-pay

## 2023-10-24 ENCOUNTER — Other Ambulatory Visit (HOSPITAL_COMMUNITY): Payer: Self-pay

## 2023-10-24 ENCOUNTER — Other Ambulatory Visit: Payer: Self-pay

## 2023-11-05 ENCOUNTER — Other Ambulatory Visit (HOSPITAL_COMMUNITY): Payer: Self-pay

## 2023-11-08 ENCOUNTER — Encounter (HOSPITAL_COMMUNITY): Payer: Self-pay

## 2023-11-08 ENCOUNTER — Ambulatory Visit (HOSPITAL_COMMUNITY)
Admission: RE | Admit: 2023-11-08 | Discharge: 2023-11-08 | Disposition: A | Discharge status: Home / Self Care | Source: Ambulatory Visit | Attending: Physician Assistant | Admitting: Physician Assistant

## 2023-11-08 ENCOUNTER — Other Ambulatory Visit (HOSPITAL_COMMUNITY): Payer: Self-pay

## 2023-11-08 VITALS — BP 118/80 | HR 87 | Ht 74.0 in | Wt 274.2 lb

## 2023-11-08 DIAGNOSIS — E119 Type 2 diabetes mellitus without complications: Secondary | ICD-10-CM | POA: Diagnosis not present

## 2023-11-08 DIAGNOSIS — Z7901 Long term (current) use of anticoagulants: Secondary | ICD-10-CM | POA: Insufficient documentation

## 2023-11-08 DIAGNOSIS — I4892 Unspecified atrial flutter: Secondary | ICD-10-CM | POA: Diagnosis not present

## 2023-11-08 DIAGNOSIS — Z79899 Other long term (current) drug therapy: Secondary | ICD-10-CM | POA: Insufficient documentation

## 2023-11-08 DIAGNOSIS — I110 Hypertensive heart disease with heart failure: Secondary | ICD-10-CM | POA: Diagnosis not present

## 2023-11-08 DIAGNOSIS — E669 Obesity, unspecified: Secondary | ICD-10-CM | POA: Diagnosis not present

## 2023-11-08 DIAGNOSIS — I513 Intracardiac thrombosis, not elsewhere classified: Secondary | ICD-10-CM

## 2023-11-08 DIAGNOSIS — Z91199 Patient's noncompliance with other medical treatment and regimen due to unspecified reason: Secondary | ICD-10-CM | POA: Insufficient documentation

## 2023-11-08 DIAGNOSIS — I5022 Chronic systolic (congestive) heart failure: Secondary | ICD-10-CM | POA: Diagnosis present

## 2023-11-08 DIAGNOSIS — Z6835 Body mass index (BMI) 35.0-35.9, adult: Secondary | ICD-10-CM | POA: Insufficient documentation

## 2023-11-08 DIAGNOSIS — Z86711 Personal history of pulmonary embolism: Secondary | ICD-10-CM | POA: Insufficient documentation

## 2023-11-08 DIAGNOSIS — I081 Rheumatic disorders of both mitral and tricuspid valves: Secondary | ICD-10-CM | POA: Diagnosis not present

## 2023-11-08 DIAGNOSIS — Z7984 Long term (current) use of oral hypoglycemic drugs: Secondary | ICD-10-CM | POA: Diagnosis not present

## 2023-11-08 DIAGNOSIS — I428 Other cardiomyopathies: Secondary | ICD-10-CM | POA: Insufficient documentation

## 2023-11-08 DIAGNOSIS — E66812 Obesity, class 2: Secondary | ICD-10-CM

## 2023-11-08 LAB — COMPREHENSIVE METABOLIC PANEL WITH GFR
ALT: 12 U/L (ref 0–44)
AST: 14 U/L — ABNORMAL LOW (ref 15–41)
Albumin: 3.6 g/dL (ref 3.5–5.0)
Alkaline Phosphatase: 79 U/L (ref 38–126)
Anion gap: 10 (ref 5–15)
BUN: 13 mg/dL (ref 6–20)
CO2: 27 mmol/L (ref 22–32)
Calcium: 8.6 mg/dL — ABNORMAL LOW (ref 8.9–10.3)
Chloride: 102 mmol/L (ref 98–111)
Creatinine, Ser: 1.07 mg/dL (ref 0.61–1.24)
GFR, Estimated: >60 mL/min (ref >60)
Glucose, Bld: 138 mg/dL — ABNORMAL HIGH (ref 70–99)
Potassium: 4.3 mmol/L (ref 3.5–5.1)
Sodium: 139 mmol/L (ref 135–145)
Total Bilirubin: 0.6 mg/dL (ref 0.0–1.2)
Total Protein: 7.1 g/dL (ref 6.5–8.1)

## 2023-11-08 LAB — BRAIN NATRIURETIC PEPTIDE: B Natriuretic Peptide: 408.8 pg/mL — ABNORMAL HIGH (ref 0.0–100.0)

## 2023-11-08 LAB — T4, FREE: Free T4: 1.22 ng/dL — ABNORMAL HIGH (ref 0.61–1.12)

## 2023-11-08 LAB — TSH: TSH: 1.669 u[IU]/mL (ref 0.350–4.500)

## 2023-11-08 MED ORDER — METOPROLOL SUCCINATE ER 25 MG PO TB24
25.0000 mg | ORAL_TABLET | Freq: Every evening | ORAL | 3 refills | Status: AC
  Filled 2023-11-08 – 2024-01-12 (×2): qty 31, 31d supply, fill #0

## 2023-11-08 MED ORDER — TORSEMIDE 20 MG PO TABS
40.0000 mg | ORAL_TABLET | Freq: Every day | ORAL | 5 refills | Status: AC
  Filled 2023-11-08: qty 60, 30d supply, fill #0

## 2023-11-08 MED ORDER — SACUBITRIL-VALSARTAN 24-26 MG PO TABS
1.0000 | ORAL_TABLET | Freq: Two times a day (BID) | ORAL | 6 refills | Status: DC
  Filled 2023-11-08 – 2023-12-13 (×3): qty 60, 30d supply, fill #0

## 2023-11-08 MED ORDER — APIXABAN 5 MG PO TABS
5.0000 mg | ORAL_TABLET | Freq: Two times a day (BID) | ORAL | 5 refills | Status: DC
  Filled 2023-11-24: qty 60, 30d supply, fill #0

## 2023-11-08 NOTE — Progress Notes (Signed)
 ReDS Vest / Clip - 11/08/23 1353       ReDS Vest / Clip   Station Marker D    Ruler Value 34    ReDS Value Range Moderate volume overload    ReDS Actual Value 36

## 2023-11-08 NOTE — Progress Notes (Signed)
 Advanced Heart Failure Clinic Note   PCP: Sebastian Beverley NOVAK, MD Primary Cardiologist: Dr. Michele AHF: Dr. Cherrie   CC: HF follow up  HPI: Jerry Saunders is a 53 y.o.. male with systolic HF due to [facility], DM2, obesity, PVCs, PE and noncompliance.     Admitted 6/24 with severeal weeks of HF symptoms after not taking meds for more than a month. Also reported syncopal episode several days prior to admission.  CT in ER showed right lower lobe proximal segmental pulmonary embolism, minimal clot burden with no evidence of right heart strain. He was tachycardic and visibly SOB requiring Bipap. Received t-PA per CCM.  Shortly after patient developed AFL at 150 bpm. Moved to ICU. Started on IV amio and low-dose NE. Ultimately required addition of milrinone  for support. Echo showed biventricular dysfunction, LVEF 20% w/ large LV clot, RV severely down. He converted to NSR on amio gtt. He diuresed w/ IV Lasix  and able to wean off milrinone  and NE support. GDMT added. Anticoagulated w/ Eliquis . Discharged home, wt 267 lb.   Here today for CHF follow-up. Has been doing great from HF standpoint. No chest pain, shortness of breath, orthopnea, PND or lower extremity edema. 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 He has been out of entresto  and eliquis  for about a week. Not taking metoprolol , thought it caused diarrhea. Taking 40 mg Torsemide  daily instead of 60 mg daily.   No ETOH or tobacco use.  Cardiac Studies - Echo (08/25): EF 25-30%, RV mildly reduced - Echo (6/24): EF 20%, + LV clot, RV severely down - cMRI (6/23): EF 20% RV 27% Minimal LGE - Cath (6/23): No CAD. RA 16 PA 67/43 (54) PCW 27 Fick 5.8/2.3 - Echo (6/23): EF < 20% RV severely HK  Past Medical History:  Diagnosis Date   Cardiomyopathy (HCC)    CHF (congestive heart failure) (HCC)    Diabetes mellitus without complication (HCC)    DVT (deep venous thrombosis) (HCC)    Hyperlipidemia    Hypertension     Current Outpatient Medications  Medication Sig Dispense Refill   acetaminophen  (TYLENOL  8 HOUR) 650 MG CR tablet Take 1 tablet (650 mg total) by mouth every 8 (eight) hours as needed for pain.     amiodarone  (PACERONE ) 200 MG tablet Take 1 tablet (200 mg total) by mouth daily. 30 tablet 3   dextromethorphan -guaiFENesin  (MUCINEX  DM) 30-600 MG 12hr tablet Take 1 tablet by mouth 2 (two) times daily. 60 tablet 0   digoxin  (LANOXIN ) 0.125 MG tablet Take 1 tablet (0.125 mg total) by mouth daily. 30 tablet 3   empagliflozin  (JARDIANCE ) 10 MG TABS tablet Take 1 tablet (10 mg total) by mouth daily. 30 tablet 1   spironolactone  (ALDACTONE ) 25 MG tablet Take 1/2 tablets (12.5 mg total) by mouth daily. 45 tablet 3   apixaban  (ELIQUIS ) 5 MG TABS tablet Take 1 tablet (5 mg total) by mouth 2 (two) times daily. 60 tablet 5   metoprolol  succinate (TOPROL  XL) 25 MG 24 hr tablet Take 1 tablet (25 mg total) by mouth at bedtime. 31 tablet 3   promethazine  (PHENERGAN ) 25 MG tablet Take 1 tablet (25 mg total) by mouth every 8 (eight) hours as needed for nausea or vomiting. (Patient not taking: Reported on 11/08/2023) 20 tablet 0   sacubitril -valsartan  (ENTRESTO ) 24-26 MG Take 1 tablet by mouth 2 (two) times daily. 60 tablet 6   torsemide  (DEMADEX ) 20 MG tablet Take 2 tablets (40 mg total) by  mouth daily. 60 tablet 5   No current facility-administered medications for this encounter.   No Known Allergies  Social History   Socioeconomic History   Marital status: Married    Spouse name: Etta   Number of children: 2   Years of education: Not on file   Highest education level: Not on file  Occupational History   Not on file  Tobacco Use   Smoking status: Never   Smokeless tobacco: Never  Vaping Use   Vaping status: Never Used  Substance and Sexual Activity   Alcohol use: No   Drug use: Never   Sexual activity: Not on file  Other Topics Concern   Not on file  Social History Narrative   1 step  daughter   Social Drivers of Corporate Investment Banker Strain: Not on file  Food Insecurity: No Food Insecurity (07/02/2022)   Hunger Vital Sign    Worried About Running Out of Food in the Last Year: Never true    Ran Out of Food in the Last Year: Never true  Transportation Needs: No Transportation Needs (07/02/2022)   PRAPARE - Administrator, Civil Service (Medical): No    Lack of Transportation (Non-Medical): No  Physical Activity: Not on file  Stress: Not on file  Social Connections: Not on file  Intimate Partner Violence: Not on file    History reviewed. No pertinent family history.  BP 118/80   Pulse 87   Ht 6' 2 (1.88 m)   Wt 124.4 kg (274 lb 3.2 oz)   SpO2 94%   BMI 35.21 kg/m   Wt Readings from Last 3 Encounters:  11/08/23 124.4 kg (274 lb 3.2 oz)  08/12/23 123.9 kg (273 lb 3.2 oz)  01/16/23 121.3 kg (267 lb 6.4 oz)   PHYSICAL EXAM: General: Ambulated into clinic. No distress..  Cor: JVP ~ 8 cm. Rhythm regular. No murmurs. Lungs: clear Abdomen: soft, nontender, nondistended. Extremities: no edema Neuro: alert & orientedx3. Affect pleasant  ReDS 36%, mildly elevated  ECG (personally reviewed): NSR 69 bpm, 1st degree AVB  ASSESSMENT & PLAN:  1. Chronic Systolic HF due to NICM - Echo 6/23: EF < 20% RV severely HK - Cath 6/23: No CAD. RA 16 PA 67/43 (54) PCW 27 Fick 5.8/2.3 - cMRI 6/23: EF 20% RV 27% Minimal LGE -CGS in 6/24 d/t noncompliance. Required Milrinone  support. - Echo 6/24: EF 20% RV severely down. Large LV clot. Moderate to severe MR. Moderate to severe TR. - Echo 8/25: EF 25%, RV mildly down - NYHA I-II. Volume mildly up on exam and by ReDS which is 36%.  - Restart Entresto  24/26 mg BID - Continue Torsemide  40 mg daily - Restart Toprol  XL 25 mg daily - continue Jardiance  10 mg daily. - continue spironolactone  12.5 mg daily.  - continue digoxin  0.125 mg daily - CMET/BNP today - Repeat echo once on maximally tolerated GDMT. -  Ideally needs transplant but non-compliance is an ongoing issue. Encouraged to continue consistent use of meds + f/u. Would benefit from Paramedicine, however is not in [facility].   2. Pulmonary Embolism  - s/p t-PA 6/24 - Suspect PE is incidental due to severe RV stasis.  - Continue Eliquis  5 mg bid, no bleeding issues. Has been out X 1 week, refilled today  3. Paroxysmal Atrial Flutter  -  SR today - Continue amiodarone  200 mg daily. Check LFTs and thryoid labs.  - Continue Eliquis  5 mg BID.   4.  LV thrombus - in setting of severe LV dysfunction/AK  - Continue Eliquis    5. Valvular heart disease - Echo 6/24 with EF < 20%, moderate to severe MR and moderate to severe TR - Echo 08/25: EF 25-30%, trivial MR and TR - Suspect functional - Treatment of this is HF optimization per above    6. DM2 - Most recent A1c 5.9  - Continue SGLT2i - Management per PCP   7. Obesity - has been trying to lose weight, but has not been successful - referred to [facility] for GLP1 but has not made an appointment   8.  Non-compliance - Continues to struggle with compliance, making attempts to improve. Poor health literacy. - Discussed importance of strict compliance to help w/ management of HF and improve candidacy for advanced therapies if later needed   Follow up 6 weeks to assess compliance/titrate GDMT  [facility], Briannon Boggio N, PA-C 11/08/23

## 2023-11-08 NOTE — Patient Instructions (Signed)
 Medication Changes:  RESTART Metoprolol  XL 25 mg Daily at bedtime  Lab Work:  Labs done today, your results will be available in MyChart, we will contact you for abnormal readings.   Special Instructions // Education:  Do the following things EVERYDAY: Weigh yourself in the morning before breakfast. Write it down and keep it in a log. Take your medicines as prescribed Eat low salt foods--Limit salt (sodium) to 2000 mg per day.  Stay as active as you can everyday Limit all fluids for the day to less than 2 liters   Follow-Up in: 6 weeks   At the [facility], you and your health needs are our priority. We have a designated team specialized in the treatment of Heart Failure. This Care Team includes your primary Heart Failure Specialized Cardiologist (physician), Advanced Practice Providers (APPs- Physician Assistants and Nurse Practitioners), and Pharmacist who all work together to provide you with the care you need, when you need it.   You may see any of the following providers on your designated Care Team at your next follow up:  Dr. Toribio Fuel Dr. Ezra Shuck Dr. Odis Brownie Greig Mosses, NP Caffie Shed, [place] [facility] [place], [place] Beckey Coe, NP Jordan Lee, NP Tinnie Redman, PharmD   Please be sure to bring in all your medications bottles to every appointment.   Need to Contact Us :  If you have any questions or concerns before your next appointment please send us  a message through [place] or call our office at [phone].    TO LEAVE A MESSAGE FOR THE NURSE SELECT OPTION 2, PLEASE LEAVE A MESSAGE INCLUDING: YOUR NAME DATE OF BIRTH CALL BACK NUMBER REASON FOR CALL**this is important as we prioritize the call backs  YOU WILL RECEIVE A CALL BACK THE SAME DAY AS LONG AS YOU CALL BEFORE 4:00 PM

## 2023-11-11 ENCOUNTER — Ambulatory Visit (HOSPITAL_COMMUNITY): Payer: Self-pay | Admitting: Physician Assistant

## 2023-11-14 ENCOUNTER — Other Ambulatory Visit (HOSPITAL_COMMUNITY): Payer: Self-pay

## 2023-11-19 ENCOUNTER — Other Ambulatory Visit (HOSPITAL_COMMUNITY): Payer: Self-pay

## 2023-11-22 ENCOUNTER — Encounter: Admitting: Family Medicine

## 2023-11-24 ENCOUNTER — Other Ambulatory Visit: Payer: Self-pay | Admitting: Family Medicine

## 2023-11-24 DIAGNOSIS — E1165 Type 2 diabetes mellitus with hyperglycemia: Secondary | ICD-10-CM

## 2023-11-25 ENCOUNTER — Other Ambulatory Visit (HOSPITAL_COMMUNITY): Payer: Self-pay

## 2023-11-25 ENCOUNTER — Other Ambulatory Visit: Payer: Self-pay

## 2023-11-25 MED ORDER — EMPAGLIFLOZIN 10 MG PO TABS
10.0000 mg | ORAL_TABLET | Freq: Every day | ORAL | 1 refills | Status: AC
  Filled 2023-11-25 – 2023-12-13 (×2): qty 30, 30d supply, fill #0
  Filled 2024-01-29: qty 30, 30d supply, fill #1

## 2023-11-25 NOTE — Telephone Encounter (Signed)
 Refill request for Jardiance  10mg  LOV 11/13/2023 for chronic conditions management. 01/16/2023 for acute illnes FOV 01/14/2024 Last refill 08/27/2023 Med sent.

## 2023-12-04 ENCOUNTER — Other Ambulatory Visit (HOSPITAL_COMMUNITY): Payer: Self-pay

## 2023-12-13 ENCOUNTER — Other Ambulatory Visit: Payer: Self-pay

## 2023-12-13 ENCOUNTER — Other Ambulatory Visit (HOSPITAL_COMMUNITY): Payer: Self-pay

## 2023-12-16 NOTE — Progress Notes (Signed)
 [facility] Note   PCP: Sebastian Beverley NOVAK, MD Primary Cardiologist: Dr. Michele AHF: Dr. Cherrie   CC: HF follow up  HPI: Jerry Saunders is a 53 y.o.. male with systolic HF due to [facility], DM2, obesity, PVCs, PE and noncompliance.     Admitted 6/24 with severeal weeks of HF symptoms after not taking meds for more than a month. Also reported syncopal episode several days prior to admission.  CT in ER showed right lower lobe proximal segmental pulmonary embolism, minimal clot burden with no evidence of right heart strain. He was tachycardic and visibly SOB requiring Bipap. Received t-PA per CCM.  Shortly after patient developed AFL at 150 bpm. Moved to ICU. Started on IV amio and low-dose NE. Ultimately required addition of milrinone  for support. Echo showed biventricular dysfunction, LVEF 20% w/ large LV clot, RV severely down. He converted to NSR on amio gtt. He diuresed w/ IV Lasix  and able to wean off milrinone  and NE support. GDMT added. Anticoagulated w/ Eliquis . Discharged home, wt 267 lb.   Here today for CHF follow-up. Has been doing great from HF standpoint. No chest pain, shortness of breath, orthopnea, PND or lower extremity edema. 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 He has been out of entresto  and eliquis  for about a week. Not taking metoprolol , thought it caused diarrhea. Taking 40 mg Torsemide  daily instead of 60 mg daily.   No ETOH or tobacco use.  Cardiac Studies - Echo (08/25): EF 25-30%, RV mildly reduced - Echo (6/24): EF 20%, + LV clot, RV severely down - cMRI (6/23): EF 20% RV 27% Minimal LGE - Cath (6/23): No CAD. RA 16 PA 67/43 (54) PCW 27 Fick 5.8/2.3 - Echo (6/23): EF < 20% RV severely HK  Past Medical History:  Diagnosis Date   Cardiomyopathy (HCC)    CHF (congestive heart failure) (HCC)    Diabetes mellitus without complication (HCC)    DVT (deep venous thrombosis) (HCC)    Hyperlipidemia    Hypertension     Current Outpatient Medications  Medication Sig Dispense Refill   acetaminophen  (TYLENOL  8 HOUR) 650 MG CR tablet Take 1 tablet (650 mg total) by mouth every 8 (eight) hours as needed for pain.     amiodarone  (PACERONE ) 200 MG tablet Take 1 tablet (200 mg total) by mouth daily. 30 tablet 3   apixaban  (ELIQUIS ) 5 MG TABS tablet Take 1 tablet (5 mg total) by mouth 2 (two) times daily. 60 tablet 5   dextromethorphan -guaiFENesin  (MUCINEX  DM) 30-600 MG 12hr tablet Take 1 tablet by mouth 2 (two) times daily. 60 tablet 0   digoxin  (LANOXIN ) 0.125 MG tablet Take 1 tablet (0.125 mg total) by mouth daily. 30 tablet 3   empagliflozin  (JARDIANCE ) 10 MG TABS tablet Take 1 tablet (10 mg total) by mouth daily. 30 tablet 1   metoprolol  succinate (TOPROL  XL) 25 MG 24 hr tablet Take 1 tablet (25 mg total) by mouth at bedtime. 31 tablet 3   promethazine  (PHENERGAN ) 25 MG tablet Take 1 tablet (25 mg total) by mouth every 8 (eight) hours as needed for nausea or vomiting. (Patient not taking: Reported on 11/08/2023) 20 tablet 0   sacubitril -valsartan  (ENTRESTO ) 24-26 MG Take 1 tablet by mouth 2 (two) times daily. 60 tablet 6   spironolactone  (ALDACTONE ) 25 MG tablet Take 1/2 tablets (12.5 mg total) by mouth daily. 45 tablet 3   torsemide  (DEMADEX ) 20 MG tablet Take 2 tablets (40 mg total) by  mouth daily. 60 tablet 5   No current facility-administered medications for this visit.   No Known Allergies  Social History   Socioeconomic History   Marital status: Married    Spouse name: Etta   Number of children: 2   Years of education: Not on file   Highest education level: Not on file  Occupational History   Not on file  Tobacco Use   Smoking status: Never   Smokeless tobacco: Never  Vaping Use   Vaping status: Never Used  Substance and Sexual Activity   Alcohol use: No   Drug use: Never   Sexual activity: Not on file  Other Topics Concern   Not on file  Social History Narrative   1 step daughter    Social Drivers of Corporate Investment Banker Strain: Not on file  Food Insecurity: No Food Insecurity (07/02/2022)   Hunger Vital Sign    Worried About Running Out of Food in the Last Year: Never true    Ran Out of Food in the Last Year: Never true  Transportation Needs: No Transportation Needs (07/02/2022)   PRAPARE - Administrator, Civil Service (Medical): No    Lack of Transportation (Non-Medical): No  Physical Activity: Not on file  Stress: Not on file  Social Connections: Not on file  Intimate Partner Violence: Not on file    No family history on file.  There were no vitals taken for this visit.  Wt Readings from Last 3 Encounters:  11/08/23 124.4 kg (274 lb 3.2 oz)  08/12/23 123.9 kg (273 lb 3.2 oz)  01/16/23 121.3 kg (267 lb 6.4 oz)   PHYSICAL EXAM: General: Ambulated into clinic. No distress..  Cor: JVP ~ 8 cm. Rhythm regular. No murmurs. Lungs: clear Abdomen: soft, nontender, nondistended. Extremities: no edema Neuro: alert & orientedx3. Affect pleasant  ReDS 36%, mildly elevated  ECG (personally reviewed): NSR 69 bpm, 1st degree AVB  ASSESSMENT & PLAN:  1. Chronic Systolic HF due to NICM - Echo 6/23: EF < 20% RV severely HK - Cath 6/23: No CAD. RA 16 PA 67/43 (54) PCW 27 Fick 5.8/2.3 - cMRI 6/23: EF 20% RV 27% Minimal LGE -CGS in 6/24 d/t noncompliance. Required Milrinone  support. - Echo 6/24: EF 20% RV severely down. Large LV clot. Moderate to severe MR. Moderate to severe TR. - Echo 8/25: EF 25%, RV mildly down - NYHA I-II. Volume mildly up on exam and by ReDS which is 36%.  - Restart Entresto  24/26 mg BID - Continue Torsemide  40 mg daily - Restart Toprol  XL 25 mg daily - continue Jardiance  10 mg daily. - continue spironolactone  12.5 mg daily.  - continue digoxin  0.125 mg daily - CMET/BNP today - Repeat echo once on maximally tolerated GDMT. - Ideally needs transplant but non-compliance is an ongoing issue. Encouraged to continue  consistent use of meds + f/u. Would benefit from Paramedicine, however is not in [facility].   2. Pulmonary Embolism  - s/p t-PA 6/24 - Suspect PE is incidental due to severe RV stasis.  - Continue Eliquis  5 mg bid, no bleeding issues. Has been out X 1 week, refilled today  3. Paroxysmal Atrial Flutter  -  SR today - Continue amiodarone  200 mg daily. Check LFTs and thryoid labs.  - Continue Eliquis  5 mg BID.   4. LV thrombus - in setting of severe LV dysfunction/AK  - Continue Eliquis    5. Valvular heart disease - Echo 6/24 with EF <  20%, moderate to severe MR and moderate to severe TR - Echo 08/25: EF 25-30%, trivial MR and TR - Suspect functional - Treatment of this is HF optimization per above    6. DM2 - Most recent A1c 5.9  - Continue SGLT2i - Management per PCP   7. Obesity - has been trying to lose weight, but has not been successful - referred to [facility] for GLP1 but has not made an appointment   8.  Non-compliance - Continues to struggle with compliance, making attempts to improve. Poor health literacy. - Discussed importance of strict compliance to help w/ management of HF and improve candidacy for advanced therapies if later needed   Follow up 6 weeks to assess compliance/titrate GDMT  Harlene CHRISTELLA Gainer, FNP 12/16/23

## 2023-12-19 ENCOUNTER — Telehealth (HOSPITAL_COMMUNITY): Payer: Self-pay

## 2023-12-19 NOTE — Telephone Encounter (Signed)
 Called to confirm/remind patient of their appointment at the [facility] on 12/20/23.   Appointment:   [] Confirmed  [x] Left mess   [] No answer/No voice mail  [] VM Full/unable to leave message  [] Phone not in service  And to bring in all medications and/or complete list.

## 2023-12-20 ENCOUNTER — Other Ambulatory Visit (HOSPITAL_COMMUNITY): Payer: Self-pay

## 2023-12-20 ENCOUNTER — Encounter (HOSPITAL_COMMUNITY): Payer: Self-pay

## 2023-12-20 ENCOUNTER — Ambulatory Visit (HOSPITAL_COMMUNITY)
Admission: RE | Admit: 2023-12-20 | Discharge: 2023-12-20 | Disposition: A | Source: Ambulatory Visit | Attending: Family Medicine

## 2023-12-20 ENCOUNTER — Other Ambulatory Visit: Payer: Self-pay

## 2023-12-20 VITALS — BP 124/82 | HR 74 | Ht 74.0 in | Wt 277.4 lb

## 2023-12-20 DIAGNOSIS — I4892 Unspecified atrial flutter: Secondary | ICD-10-CM | POA: Diagnosis not present

## 2023-12-20 DIAGNOSIS — I5022 Chronic systolic (congestive) heart failure: Secondary | ICD-10-CM | POA: Diagnosis not present

## 2023-12-20 DIAGNOSIS — E66812 Obesity, class 2: Secondary | ICD-10-CM

## 2023-12-20 DIAGNOSIS — Z6835 Body mass index (BMI) 35.0-35.9, adult: Secondary | ICD-10-CM | POA: Diagnosis not present

## 2023-12-20 DIAGNOSIS — I38 Endocarditis, valve unspecified: Secondary | ICD-10-CM | POA: Diagnosis not present

## 2023-12-20 DIAGNOSIS — E1165 Type 2 diabetes mellitus with hyperglycemia: Secondary | ICD-10-CM

## 2023-12-20 DIAGNOSIS — Z86711 Personal history of pulmonary embolism: Secondary | ICD-10-CM

## 2023-12-20 DIAGNOSIS — I513 Intracardiac thrombosis, not elsewhere classified: Secondary | ICD-10-CM | POA: Diagnosis not present

## 2023-12-20 DIAGNOSIS — Z91199 Patient's noncompliance with other medical treatment and regimen due to unspecified reason: Secondary | ICD-10-CM | POA: Diagnosis not present

## 2023-12-20 LAB — IRON AND TIBC
Iron: 43 ug/dL — ABNORMAL LOW (ref 45–182)
Saturation Ratios: 11 % — ABNORMAL LOW (ref 17.9–39.5)
TIBC: 391 ug/dL (ref 250–450)
UIBC: 348 ug/dL

## 2023-12-20 LAB — BASIC METABOLIC PANEL WITH GFR
Anion gap: 9 (ref 5–15)
BUN: 15 mg/dL (ref 6–20)
CO2: 31 mmol/L (ref 22–32)
Calcium: 8.5 mg/dL — ABNORMAL LOW (ref 8.9–10.3)
Chloride: 100 mmol/L (ref 98–111)
Creatinine, Ser: 1.03 mg/dL (ref 0.61–1.24)
GFR, Estimated: >60 mL/min (ref >60)
Glucose, Bld: 182 mg/dL — ABNORMAL HIGH (ref 70–99)
Potassium: 4.1 mmol/L (ref 3.5–5.1)
Sodium: 140 mmol/L (ref 135–145)

## 2023-12-20 LAB — FERRITIN: Ferritin: 87 ng/mL (ref 24–336)

## 2023-12-20 LAB — DIGOXIN LEVEL: Digoxin Level: <0.6 ng/mL — ABNORMAL LOW (ref 0.8–2.0)

## 2023-12-20 LAB — CBC
HCT: 43.5 % (ref 39.0–52.0)
Hemoglobin: 13.8 g/dL (ref 13.0–17.0)
MCH: 28.2 pg (ref 26.0–34.0)
MCHC: 31.7 g/dL (ref 30.0–36.0)
MCV: 88.8 fL (ref 80.0–100.0)
Platelets: 322 K/uL (ref 150–400)
RBC: 4.9 MIL/uL (ref 4.22–5.81)
RDW: 13.8 % (ref 11.5–15.5)
WBC: 7.2 K/uL (ref 4.0–10.5)
nRBC: 0 % (ref 0.0–0.2)

## 2023-12-20 LAB — BRAIN NATRIURETIC PEPTIDE: B Natriuretic Peptide: 401.5 pg/mL — ABNORMAL HIGH (ref 0.0–100.0)

## 2023-12-20 MED ORDER — APIXABAN 5 MG PO TABS
5.0000 mg | ORAL_TABLET | Freq: Two times a day (BID) | ORAL | 5 refills | Status: AC
  Filled 2023-12-20 – 2024-01-29 (×2): qty 60, 30d supply, fill #0

## 2023-12-20 MED ORDER — LOSARTAN POTASSIUM 25 MG PO TABS
25.0000 mg | ORAL_TABLET | Freq: Every day | ORAL | 11 refills | Status: AC
  Filled 2023-12-20 – 2024-01-29 (×3): qty 30, 30d supply, fill #0

## 2023-12-20 NOTE — Patient Instructions (Signed)
 STOP Entresto   START Losartan  25 mg daily.  Labs done today, your results will be available in MyChart, we will contact you for abnormal readings.   Your physician recommends that you schedule a follow-up appointment in: 3 months ( March 2026) **  PLEASE CALL THE OFFICE IN [place] TO ARRANGE YOUR FOLLOW UP APPOINTMENT.**  If you have any questions or concerns before your next appointment please send us  a message through [place] or call our office at [phone].    TO LEAVE A MESSAGE FOR THE NURSE SELECT OPTION 2, PLEASE LEAVE A MESSAGE INCLUDING: YOUR NAME DATE OF BIRTH CALL BACK NUMBER REASON FOR CALL**this is important as we prioritize the call backs  YOU WILL RECEIVE A CALL BACK THE SAME DAY AS LONG AS YOU CALL BEFORE 4:00 PM  At the [facility], you and your health needs are our priority. As part of our continuing mission to provide you with exceptional heart care, we have created designated Provider Care Teams. These Care Teams include your primary Cardiologist (physician) and Advanced Practice Providers (APPs- Physician Assistants and Nurse Practitioners) who all work together to provide you with the care you need, when you need it.   You may see any of the following providers on your designated Care Team at your next follow up: Dr Toribio Fuel Dr Ezra Shuck Dr. Morene Brownie Greig Mosses, NP Caffie Shed, [place] [facility] [place], [place] Beckey Coe, NP Jordan Lee, NP Ellouise Class, NP Tinnie Redman, PharmD Jaun Bash, PharmD   Please be sure to bring in all your medications bottles to every appointment.    Thank you for choosing [facility]

## 2023-12-20 NOTE — Progress Notes (Signed)
 Medication Samples have been provided to the patient.  Drug name:Eliquis        Strength: 5 mg         Qty: 4 boxes  LOT: [id number]  Exp.Date: 06/27  Dosing instructions: take 1 tablet Twice daily  Medication Samples have been provided to the patient.  Drug name: Jardiance        Strength: 10 mg        Qty: 4 bottles  LOT: [id number]  Exp.Date: 04/07/25  Dosing instructions: take 1 tablet daily  The patient has been instructed regarding the correct time, dose, and frequency of taking this medication, including desired effects and most common side effects.   Dastan Krider M Lasheka Kempner 3:26 PM 12/20/2023

## 2023-12-20 NOTE — Addendum Note (Signed)
 Encounter addended by: Marcelina Lisa HERO, RN on: 12/20/2023 3:27 PM  Actions taken: Clinical Note Signed

## 2023-12-23 ENCOUNTER — Ambulatory Visit (HOSPITAL_COMMUNITY): Payer: Self-pay | Admitting: Family Medicine

## 2023-12-31 ENCOUNTER — Other Ambulatory Visit (HOSPITAL_COMMUNITY): Payer: Self-pay

## 2024-01-12 ENCOUNTER — Encounter (HOSPITAL_COMMUNITY): Payer: Self-pay | Admitting: Internal Medicine

## 2024-01-13 ENCOUNTER — Other Ambulatory Visit (HOSPITAL_COMMUNITY): Payer: Self-pay

## 2024-01-13 ENCOUNTER — Other Ambulatory Visit: Payer: Self-pay

## 2024-01-14 ENCOUNTER — Encounter: Admitting: Family Medicine

## 2024-01-14 ENCOUNTER — Encounter (HOSPITAL_COMMUNITY): Payer: Self-pay | Admitting: Internal Medicine

## 2024-01-23 ENCOUNTER — Other Ambulatory Visit (HOSPITAL_COMMUNITY): Payer: Self-pay

## 2024-01-29 ENCOUNTER — Other Ambulatory Visit (HOSPITAL_COMMUNITY): Payer: Self-pay

## 2024-01-29 NOTE — Telephone Encounter (Signed)
 Very difficult to tell if he is retaining fluid based on limited details provided. He can try taking an extra 20 mg Torsemide  for 2 days to see if that helps. He should keep track of his weight daily and let us  know if weight is trending up.

## 2024-03-16 ENCOUNTER — Ambulatory Visit

## 2024-04-20 ENCOUNTER — Ambulatory Visit (HOSPITAL_COMMUNITY): Admitting: Internal Medicine
